# Patient Record
Sex: Male | Born: 1955
Health system: Southern US, Community
[De-identification: ages and names within clinical notes are randomized; demographics above are authoritative.]

## PROBLEM LIST (undated history)

## (undated) DIAGNOSIS — T8859XA Other complications of anesthesia, initial encounter: Secondary | ICD-10-CM

## (undated) DIAGNOSIS — C801 Malignant (primary) neoplasm, unspecified: Secondary | ICD-10-CM

## (undated) DIAGNOSIS — T4145XA Adverse effect of unspecified anesthetic, initial encounter: Secondary | ICD-10-CM

## (undated) DIAGNOSIS — F1021 Alcohol dependence, in remission: Secondary | ICD-10-CM

## (undated) DIAGNOSIS — G4730 Sleep apnea, unspecified: Secondary | ICD-10-CM

## (undated) DIAGNOSIS — K219 Gastro-esophageal reflux disease without esophagitis: Secondary | ICD-10-CM

## (undated) DIAGNOSIS — D490 Neoplasm of unspecified behavior of digestive system: Secondary | ICD-10-CM

## (undated) DIAGNOSIS — R21 Rash and other nonspecific skin eruption: Secondary | ICD-10-CM

## (undated) DIAGNOSIS — J189 Pneumonia, unspecified organism: Secondary | ICD-10-CM

## (undated) DIAGNOSIS — R011 Cardiac murmur, unspecified: Secondary | ICD-10-CM

## (undated) HISTORY — PX: OTHER SURGICAL HISTORY: SHX169

## (undated) HISTORY — PX: TUMOR REMOVAL: SHX12

## (undated) HISTORY — DX: Alcohol dependence, in remission: F10.21

## (undated) HISTORY — DX: Neoplasm of unspecified behavior of digestive system: D49.0

---
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
## 2004-08-01 ENCOUNTER — Ambulatory Visit: Payer: Self-pay | Admitting: Family Medicine

## 2004-08-09 ENCOUNTER — Ambulatory Visit: Payer: Self-pay | Admitting: Cardiology

## 2004-08-28 ENCOUNTER — Ambulatory Visit: Payer: Self-pay | Admitting: Family Medicine

## 2004-10-10 ENCOUNTER — Ambulatory Visit: Payer: Self-pay | Admitting: Family Medicine

## 2005-03-04 ENCOUNTER — Ambulatory Visit: Payer: Self-pay | Admitting: Family Medicine

## 2006-10-16 ENCOUNTER — Ambulatory Visit: Payer: Self-pay | Admitting: Family Medicine

## 2007-01-25 ENCOUNTER — Encounter (INDEPENDENT_AMBULATORY_CARE_PROVIDER_SITE_OTHER): Payer: Self-pay | Admitting: Urology

## 2007-01-25 ENCOUNTER — Other Ambulatory Visit: Admission: RE | Admit: 2007-01-25 | Discharge: 2007-01-25 | Payer: Self-pay | Admitting: Urology

## 2013-05-13 ENCOUNTER — Emergency Department (HOSPITAL_COMMUNITY): Payer: BC Managed Care – PPO

## 2013-05-13 ENCOUNTER — Observation Stay (HOSPITAL_COMMUNITY)
Admission: EM | Admit: 2013-05-13 | Discharge: 2013-05-14 | Disposition: A | Discharge status: Home / Self Care | Payer: BC Managed Care – PPO | Attending: Internal Medicine | Admitting: Internal Medicine

## 2013-05-13 ENCOUNTER — Encounter (HOSPITAL_COMMUNITY): Payer: Self-pay | Admitting: Emergency Medicine

## 2013-05-13 DIAGNOSIS — R42 Dizziness and giddiness: Secondary | ICD-10-CM | POA: Diagnosis present

## 2013-05-13 DIAGNOSIS — H8110 Benign paroxysmal vertigo, unspecified ear: Principal | ICD-10-CM | POA: Insufficient documentation

## 2013-05-13 DIAGNOSIS — K3189 Other diseases of stomach and duodenum: Secondary | ICD-10-CM

## 2013-05-13 DIAGNOSIS — R079 Chest pain, unspecified: Secondary | ICD-10-CM | POA: Insufficient documentation

## 2013-05-13 DIAGNOSIS — I10 Essential (primary) hypertension: Secondary | ICD-10-CM | POA: Insufficient documentation

## 2013-05-13 DIAGNOSIS — K319 Disease of stomach and duodenum, unspecified: Secondary | ICD-10-CM | POA: Insufficient documentation

## 2013-05-13 HISTORY — DX: Gastro-esophageal reflux disease without esophagitis: K21.9

## 2013-05-13 HISTORY — DX: Cardiac murmur, unspecified: R01.1

## 2013-05-13 LAB — PROTIME-INR
INR: 1.06 (ref 0.00–1.49)
Prothrombin Time: 13.6 seconds (ref 11.6–15.2)

## 2013-05-13 LAB — BASIC METABOLIC PANEL
CO2: 26 mEq/L (ref 19–32)
Calcium: 9.8 mg/dL (ref 8.4–10.5)
Chloride: 101 mEq/L (ref 96–112)
GFR calc Af Amer: >90 mL/min (ref >90)
Glucose, Bld: 110 mg/dL — ABNORMAL HIGH (ref 70–99)
Potassium: 4.3 mEq/L (ref 3.5–5.1)
Sodium: 136 mEq/L (ref 135–145)

## 2013-05-13 LAB — CBC WITH DIFFERENTIAL/PLATELET
Basophils Absolute: 0 10*3/uL (ref 0.0–0.1)
Eosinophils Absolute: 0.1 10*3/uL (ref 0.0–0.7)
Eosinophils Relative: 1 % (ref 0–5)
Lymphocytes Relative: 22 % (ref 12–46)
Lymphs Abs: 2.4 10*3/uL (ref 0.7–4.0)
MCH: 30.3 pg (ref 26.0–34.0)
MCV: 91.3 fL (ref 78.0–100.0)
Platelets: 316 10*3/uL (ref 150–400)
RBC: 4.82 MIL/uL (ref 4.22–5.81)
WBC: 10.8 10*3/uL — ABNORMAL HIGH (ref 4.0–10.5)

## 2013-05-13 LAB — TROPONIN I: Troponin I: <0.3 ng/mL (ref <0.30)

## 2013-05-13 LAB — APTT: aPTT: 29 seconds (ref 24–37)

## 2013-05-13 MED ORDER — IOHEXOL 350 MG/ML SOLN
100.0000 mL | Freq: Once | INTRAVENOUS | Status: AC | PRN
  Administered 2013-05-13: 100 mL via INTRAVENOUS

## 2013-05-13 MED ORDER — MECLIZINE HCL 12.5 MG PO TABS
25.0000 mg | ORAL_TABLET | Freq: Once | ORAL | Status: AC
  Administered 2013-05-13: 25 mg via ORAL
  Filled 2013-05-13: qty 2

## 2013-05-13 MED ORDER — SODIUM CHLORIDE 0.9 % IV BOLUS (SEPSIS)
1000.0000 mL | Freq: Once | INTRAVENOUS | Status: AC
  Administered 2013-05-13: 1000 mL via INTRAVENOUS

## 2013-05-13 NOTE — ED Provider Notes (Signed)
CSN: [id number]     Arrival date & time 05/13/13  1815 History   First MD Initiated Contact with Patient 05/13/13 1821     This chart was scribed for Timothy Covington B. Bernette Mayers, MD by Manuela Schwartz, ED scribe. This patient was seen in room APA12/APA12 and the patient's care was started at 1815.  Chief Complaint  Patient presents with  . Dizziness   The history is provided by the spouse and the patient. No language interpreter was used.   HPI Comments: Timothy Hall is a 57 y.o. male who presents to the Emergency Department complaining of dizziness episode today while at work with associated midsternal CP which radiates to his neck and upper back. He states his dizziness occurs when he stands up but his CP is gone now. He states feels a little off-balance while walking and having generalized weakness in his legs; he denies any speech changes. He went to [facility] from work and was sent here for further evaluation. He was given 325 mg ASA at [facility] and now is CP free. He also c/o temporal HA which resolved.  He denies SOB. He states previous episode, 4 days ago recently but this was more severe. He states a baseline tinnitus.  Family hx of MI and x4 CABG  PCP Dr. [name]- Has not seen him for a while  Past Medical History  Diagnosis Date  . GERD (gastroesophageal reflux disease)   . Heart murmur    Past Surgical History  Procedure Laterality Date  . 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    No family history on file. History  Substance Use Topics  . Smoking status: Current Every Day Smoker  . Smokeless tobacco: Not on file  . Alcohol Use: No     Comment: quit in 1985    Review of Systems  Constitutional: Negative for fever and chills.  Respiratory: Negative for shortness of breath.   Cardiovascular: Positive for chest pain.  Gastrointestinal: Negative for nausea and vomiting.  Musculoskeletal: Positive for back pain (upper back pain).  Neurological: Positive for  dizziness and headaches. Negative for weakness.  All other systems reviewed and are negative.   A complete 10 system review of systems was obtained and all systems are negative except as noted in the HPI and PMH.   Allergies  Review of patient's allergies indicates no known allergies.  Home Medications  No current outpatient prescriptions on file. Triage Vitals: BP 169/69  Pulse 66  Temp(Src) 98.2 F (36.8 C) (Oral)  Resp 16  Ht 6' (1.829 m)  Wt 189 lb (85.73 kg)  BMI 25.63 kg/m2  SpO2 95% Physical Exam  Nursing note and vitals reviewed. Constitutional: He is oriented to person, place, and time. He appears well-developed and well-nourished.  HENT:  Head: Normocephalic and atraumatic.  Eyes: EOM are normal. Pupils are equal, round, and reactive to light.  Neck: Normal range of motion. Neck supple.  Cardiovascular: Normal rate, normal heart sounds and intact distal pulses.   Pulmonary/Chest: Effort normal and breath sounds normal.  Abdominal: Bowel sounds are normal. He exhibits no distension. There is no tenderness.  Musculoskeletal: Normal range of motion. He exhibits no edema and no tenderness.  Neurological: He is alert and oriented to person, place, and time. He has normal strength. No cranial nerve deficit or sensory deficit.  Skin: Skin is warm and dry. No rash noted.  Psychiatric: He has a normal mood and affect.  ED Course  Procedures (including critical care time) DIAGNOSTIC STUDIES: Oxygen Saturation is 95% on room air, adequate by my interpretation.    COORDINATION OF CARE: At 710 PM Discussed treatment plan with patient which includes CXR, head CT, blood work, cardiac enzymes, EKG. Patient agrees.   Labs Review Labs Reviewed  CBC WITH DIFFERENTIAL - Abnormal; Notable for the following:    WBC 10.8 (*)    All other components within normal limits  BASIC METABOLIC PANEL - Abnormal; Notable for the following:    Glucose, Bld 110 (*)    All other components  within normal limits  COMPREHENSIVE METABOLIC PANEL - Abnormal; Notable for the following:    Glucose, Bld 101 (*)    All other components within normal limits  TROPONIN I  PROTIME-INR  APTT  TROPONIN I  TROPONIN I  TROPONIN I  CBC   Imaging Review Dg Chest 2 View  05/13/2013   CLINICAL DATA:  Chest pain, dizziness.  EXAM: CHEST  2 VIEW  COMPARISON:  None.  FINDINGS: The heart size and mediastinal contours are within normal limits. Both lungs are clear. The visualized skeletal structures are unremarkable.  IMPRESSION: No active cardiopulmonary disease.   Electronically Signed   By: Roque Lias M.D.   On: 05/13/2013 19:21   Ct Head Wo Contrast  05/13/2013   CLINICAL DATA:  Dizziness and chest pain today  EXAM: CT HEAD WITHOUT CONTRAST  TECHNIQUE: Contiguous axial images were obtained from the base of the skull through the vertex without intravenous contrast.  COMPARISON:  None.  FINDINGS: No mass lesion. No midline shift. No acute hemorrhage or hematoma. No extra-axial fluid collections. No evidence of acute infarction. There is scattered mild inflammatory change involving the paranasal sinuses. Calvarium is intact.  IMPRESSION: Mild sinusitis. No acute intracranial abnormalities.   Electronically Signed   By: Esperanza Heir M.D.   On: 05/13/2013 19:22   Ct Angio Chest Aorta W/cm &/or Wo/cm  05/13/2013   CLINICAL DATA:  Aortic dissection. Mid sternal chest pain. Upper back pain.  EXAM: CT ANGIOGRAPHY CHEST, ABDOMEN AND PELVIS  TECHNIQUE: Multidetector CT imaging through the chest, abdomen and pelvis was performed using the standard protocol during bolus administration of intravenous contrast. Multiplanar reconstructed images including MIPs were obtained and reviewed to evaluate the vascular anatomy.  CONTRAST:  OMNIPAQUE IOHEXOL 350 MG/ML SOLN  COMPARISON:  None.  FINDINGS: CTA CHEST FINDINGS  Noncontrast imaging of the thoracic aorta shows no evidence of intramural hematoma. 3 vessel  aortic arch appears normal. Aortic branch vessels are normal. Pulmonary arteries appear within normal limits. No pulmonary emboli are identified. Heart grossly normal. No pericardial or pleural effusion. Thoracic esophagus appears normal. Trachea and central airways appear normal. No axillary, mediastinal, or hilar adenopathy. Bones appear within normal limits.  Review of the MIP images confirms the above findings.  CTA ABDOMEN AND PELVIS FINDINGS  The abdominal aorta is normal. The mesenteric vessels appear normal. Minimal iliofemoral atherosclerosis. The urinary bladder appears normal. There is no free fluid. Colonic diverticulosis without diverticulitis. Liver appears within normal limits allowing for contrast phase. Spleen is normal. Small granulomatous calcification in the spleen is incidentally noted.  There is a very large upper abdominal mass that abuts and is intimately associated with the inferior gastric fundus. On axial imaging, the mass measures 18.6 cm transverse by 12.2 cm AP. Craniocaudal measurement is 16.8 cm. This has peripheral enhancement but is predominantly cystic with low attenuation fluid centrally (11 Hounsfield units). The  findings are suspicious for neoplasm however a duplication cyst or on old pseudocyst could have a similar appearance. The adjacent pancreas appears normal. There is no mural calcification in the mass. There is no obstruction of the stomach. No aggressive osseous lesions are identified. Mild thoracic spondylosis.  Review of the MIP images confirms the above findings.  IMPRESSION: 1. Negative CTA of the chest. 2. No acute vascular abnormality in the abdomen or pelvis. 3. Cystic mass extending off the inferior margin of the stomach. The origin is unclear and this may represent a cystic mesenteric mass or an exophytic mass arising from the serosa of the gastric fundus. The peripheral enhancement raises the possibility of neoplasm. Other differential considerations include a  mesenteric duplication cyst or pancreatic pseudocyst however the peripheral enhancement argues against a benign etiology.   Electronically Signed   By: Andreas Newport M.D.   On: 05/13/2013 21:42   Ct Angio Abd/pel W/ And/or W/o  05/13/2013   CLINICAL DATA:  Aortic dissection. Mid sternal chest pain. Upper back pain.  EXAM: CT ANGIOGRAPHY CHEST, ABDOMEN AND PELVIS  TECHNIQUE: Multidetector CT imaging through the chest, abdomen and pelvis was performed using the standard protocol during bolus administration of intravenous contrast. Multiplanar reconstructed images including MIPs were obtained and reviewed to evaluate the vascular anatomy.  CONTRAST:  OMNIPAQUE IOHEXOL 350 MG/ML SOLN  COMPARISON:  None.  FINDINGS: CTA CHEST FINDINGS  Noncontrast imaging of the thoracic aorta shows no evidence of intramural hematoma. 3 vessel aortic arch appears normal. Aortic branch vessels are normal. Pulmonary arteries appear within normal limits. No pulmonary emboli are identified. Heart grossly normal. No pericardial or pleural effusion. Thoracic esophagus appears normal. Trachea and central airways appear normal. No axillary, mediastinal, or hilar adenopathy. Bones appear within normal limits.  Review of the MIP images confirms the above findings.  CTA ABDOMEN AND PELVIS FINDINGS  The abdominal aorta is normal. The mesenteric vessels appear normal. Minimal iliofemoral atherosclerosis. The urinary bladder appears normal. There is no free fluid. Colonic diverticulosis without diverticulitis. Liver appears within normal limits allowing for contrast phase. Spleen is normal. Small granulomatous calcification in the spleen is incidentally noted.  There is a very large upper abdominal mass that abuts and is intimately associated with the inferior gastric fundus. On axial imaging, the mass measures 18.6 cm transverse by 12.2 cm AP. Craniocaudal measurement is 16.8 cm. This has peripheral enhancement but is predominantly cystic  with low attenuation fluid centrally (11 Hounsfield units). The findings are suspicious for neoplasm however a duplication cyst or on old pseudocyst could have a similar appearance. The adjacent pancreas appears normal. There is no mural calcification in the mass. There is no obstruction of the stomach. No aggressive osseous lesions are identified. Mild thoracic spondylosis.  Review of the MIP images confirms the above findings.  IMPRESSION: 1. Negative CTA of the chest. 2. No acute vascular abnormality in the abdomen or pelvis. 3. Cystic mass extending off the inferior margin of the stomach. The origin is unclear and this may represent a cystic mesenteric mass or an exophytic mass arising from the serosa of the gastric fundus. The peripheral enhancement raises the possibility of neoplasm. Other differential considerations include a mesenteric duplication cyst or pancreatic pseudocyst however the peripheral enhancement argues against a benign etiology.   Electronically Signed   By: Andreas Newport M.D.   On: 05/13/2013 21:42    EKG Interpretation     Ventricular Rate:  68 PR Interval:  160 QRS Duration:  92 QT Interval:  374 QTC Calculation: 397 R Axis:   44 Text Interpretation:  Normal sinus rhythm Normal ECG No previous ECGs available            MDM   1. Chest pain   2. Vertigo   3. Gastric mass   4. Dizziness and giddiness   5. HTN (hypertension)     Labs and imaging reviewed with the patient including intraabdominal mass of unclear etiology. Will plan admission for rule out MI and to ensure adequate evaluation of his other findings.   I personally performed the services described in this documentation, which was scribed in my presence. The recorded information has been reviewed and is accurate.        Ryliee Figge B. Bernette Mayers, MD 05/14/13 [phone]

## 2013-05-13 NOTE — ED Notes (Addendum)
Pt arrived to er by [facility] from [facility] with c/o intermittent dizziness and neck soreness, pt states that he has been having dizzy spells for the past few weeks, today the dizziness did not go away like it normally did, pt also experienced pain to mid center of back area with n/v X1 episode. Pt given 325mg  aspirin at [facility], arrived to er pain free. Denies any sob or diaphoresis, pt also reports that he felt like his legs were "heavy" today and that if he gets up the dizziness returns.

## 2013-05-14 DIAGNOSIS — H8110 Benign paroxysmal vertigo, unspecified ear: Secondary | ICD-10-CM

## 2013-05-14 DIAGNOSIS — K319 Disease of stomach and duodenum, unspecified: Secondary | ICD-10-CM

## 2013-05-14 DIAGNOSIS — I10 Essential (primary) hypertension: Secondary | ICD-10-CM

## 2013-05-14 DIAGNOSIS — R079 Chest pain, unspecified: Secondary | ICD-10-CM | POA: Diagnosis present

## 2013-05-14 DIAGNOSIS — R42 Dizziness and giddiness: Secondary | ICD-10-CM

## 2013-05-14 LAB — CBC
HCT: 41.4 % (ref 39.0–52.0)
Hemoglobin: 13.7 g/dL (ref 13.0–17.0)
MCH: 30 pg (ref 26.0–34.0)
MCHC: 33.1 g/dL (ref 30.0–36.0)
MCV: 90.8 fL (ref 78.0–100.0)
Platelets: 277 10*3/uL (ref 150–400)
RBC: 4.56 MIL/uL (ref 4.22–5.81)

## 2013-05-14 LAB — COMPREHENSIVE METABOLIC PANEL
BUN: 15 mg/dL (ref 6–23)
CO2: 27 mEq/L (ref 19–32)
Calcium: 9.1 mg/dL (ref 8.4–10.5)
Chloride: 103 mEq/L (ref 96–112)
Creatinine, Ser: 0.94 mg/dL (ref 0.50–1.35)
GFR calc Af Amer: >90 mL/min (ref >90)
GFR calc non Af Amer: >90 mL/min (ref >90)
Glucose, Bld: 101 mg/dL — ABNORMAL HIGH (ref 70–99)
Total Bilirubin: 0.3 mg/dL (ref 0.3–1.2)
Total Protein: 6.4 g/dL (ref 6.0–8.3)

## 2013-05-14 LAB — TROPONIN I: Troponin I: <0.3 ng/mL (ref <0.30)

## 2013-05-14 MED ORDER — ENOXAPARIN SODIUM 40 MG/0.4ML ~~LOC~~ SOLN
40.0000 mg | SUBCUTANEOUS | Status: DC
  Administered 2013-05-14: 40 mg via SUBCUTANEOUS
  Filled 2013-05-14: qty 0.4

## 2013-05-14 MED ORDER — MECLIZINE HCL 25 MG PO TABS: 25.0000 mg | ORAL_TABLET | Freq: Three times a day (TID) | ORAL | Status: DC

## 2013-05-14 MED ORDER — ASPIRIN EC 81 MG PO TBEC: 81.0000 mg | DELAYED_RELEASE_TABLET | Freq: Every day | ORAL | Status: DC

## 2013-05-14 MED ORDER — MECLIZINE HCL 12.5 MG PO TABS
25.0000 mg | ORAL_TABLET | Freq: Three times a day (TID) | ORAL | Status: DC
  Administered 2013-05-14: 25 mg via ORAL
  Filled 2013-05-14: qty 2

## 2013-05-14 MED ORDER — ONDANSETRON HCL 4 MG/2ML IJ SOLN: 4.0000 mg | Freq: Four times a day (QID) | INTRAMUSCULAR | Status: DC | PRN

## 2013-05-14 MED ORDER — AMLODIPINE BESYLATE 5 MG PO TABS
10.0000 mg | ORAL_TABLET | Freq: Every day | ORAL | Status: DC
  Administered 2013-05-14: 10 mg via ORAL
  Filled 2013-05-14: qty 2

## 2013-05-14 MED ORDER — HYDROCODONE-ACETAMINOPHEN 5-325 MG PO TABS: 1.0000 | ORAL_TABLET | ORAL | Status: DC | PRN

## 2013-05-14 MED ORDER — ONDANSETRON HCL 4 MG PO TABS: 4.0000 mg | ORAL_TABLET | Freq: Four times a day (QID) | ORAL | Status: DC | PRN

## 2013-05-14 NOTE — H&P (Addendum)
PCP:   [name],[name], MD   Chief Complaint:  Dizziness  HPI: 57-year-old male came to the ED with dizziness which started almost 3 weeks ago and has been going off and on. Today patient says the dizziness lasted for long time and was associated with midsternal chest pain which started from middle of the back and radiated to the neck and chest. Patient says that the dizziness gets worse on standing and he has been off balance. Patient was seen at [facility] and was sent to the endocrine ED for further evaluation. In the ED patient underwent CT scan of the head as well as CT A of the chest and abdomen to rule out aortic dissection, the report is negative for any vascular abnormality except for a cystic gastric mass noted in the stomach. Patient says  pain is resolved at this time, he denies shortness of breath no nausea vomiting or diarrhea. Patient does have family history of heart disease. Patient is a smoker and has been smoking for last 40 years.  Allergies:  No Known Allergies    Past Medical History  Diagnosis Date  . GERD (gastroesophageal reflux disease)   . Heart murmur     Past Surgical History  Procedure Laterality Date  . 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     Prior to Admission medications   Medication Sig Start Date End Date Taking? Authorizing Provider  omeprazole (PRILOSEC) 20 MG capsule Take 20 mg by mouth daily as needed (for acid reflux).   Yes Historical Provider, MD    Social History:  reports that he has been smoking.  He does not have any smokeless tobacco history on file. He reports that he does not drink alcohol or use illicit drugs.  Family history is significant for coronary artery disease  All the positives are listed in BOLD  Review of Systems:  HEENT: Headache, blurred vision, runny nose, sore throat, dizziness Neck: Hypothyroidism, hyperthyroidism,,lymphadenopathy Chest : Shortness of breath, history of  COPD, Asthma Heart : Chest pain, history of coronary arterey disease GI:  Nausea, vomiting, diarrhea, constipation, GERD GU: Dysuria, urgency, frequency of urination, hematuria Neuro: Stroke, seizures, syncope Psych: Depression, anxiety, hallucinations   Physical Exam: Blood pressure 162/78, pulse 64, temperature 97.9 F (36.6 C), temperature source Oral, resp. rate 18, height 6' (1.829 m), weight 77.293 kg (170 lb 6.4 oz), SpO2 98.00%. Constitutional:   Patient is a well-developed and well-nourished male* in no acute distress and cooperative with exam. Head: Normocephalic and atraumatic Mouth: Mucus membranes moist Eyes: PERRL, EOMI, conjunctivae normal Neck: Supple, No Thyromegaly Cardiovascular: RRR, S1 normal, S2 normal Pulmonary/Chest: CTAB, no wheezes, rales, or rhonchi Abdominal: Soft. Non-tender, non-distended, bowel sounds are normal, no masses, organomegaly, or guarding present.  Neurological: A&O x3, Strenght is normal and symmetric bilaterally, cranial nerve II-XII are grossly intact, no focal motor deficit, sensory intact to light touch bilaterally.  Extremities : No Cyanosis, Clubbing or Edema Brandt Darroff manuver done which is negative for dizziness  Labs on Admission:  Results for orders placed during the hospital encounter of 05/13/13 (from the past 48 hour(s))  CBC WITH DIFFERENTIAL     Status: Abnormal   Collection Time    05/13/13  7:23 PM      Result Value Range   WBC 10.8 (*) 4.0 - 10.5 K/uL   RBC 4.82  4.22 - 5.81 MIL/uL   Hemoglobin 14.6  13.0 - 17.0 g/dL     HCT 44.0  39.0 - 52.0 %   MCV 91.3  78.0 - 100.0 fL   MCH 30.3  26.0 - 34.0 pg   MCHC 33.2  30.0 - 36.0 g/dL   RDW 12.8  11.5 - 15.5 %   Platelets 316  150 - 400 K/uL   Neutrophils Relative % 71  43 - 77 %   Neutro Abs 7.7  1.7 - 7.7 K/uL   Lymphocytes Relative 22  12 - 46 %   Lymphs Abs 2.4  0.7 - 4.0 K/uL   Monocytes Relative 5  3 - 12 %   Monocytes Absolute 0.6  0.1 - 1.0 K/uL   Eosinophils  Relative 1  0 - 5 %   Eosinophils Absolute 0.1  0.0 - 0.7 K/uL   Basophils Relative 0  0 - 1 %   Basophils Absolute 0.0  0.0 - 0.1 K/uL  BASIC METABOLIC PANEL     Status: Abnormal   Collection Time    05/13/13  7:23 PM      Result Value Range   Sodium 136  135 - 145 mEq/L   Potassium 4.3  3.5 - 5.1 mEq/L   Chloride 101  96 - 112 mEq/L   CO2 26  19 - 32 mEq/L   Glucose, Bld 110 (*) 70 - 99 mg/dL   BUN 18  6 - 23 mg/dL   Creatinine, Ser 0.92  0.50 - 1.35 mg/dL   Calcium 9.8  8.4 - 10.5 mg/dL   GFR calc non Af Amer >90  >90 mL/min   GFR calc Af Amer >90  >90 mL/min   Comment: (NOTE)     The eGFR has been calculated using the CKD EPI equation.     This calculation has not been validated in all clinical situations.     eGFR's persistently <90 mL/min signify possible Chronic Kidney     Disease.  TROPONIN I     Status: None   Collection Time    05/13/13  7:23 PM      Result Value Range   Troponin I <0.30  <0.30 ng/mL   Comment:            Due to the release kinetics of cTnI,     a negative result within the first hours     of the onset of symptoms does not rule out     myocardial infarction with certainty.     If myocardial infarction is still suspected,     repeat the test at appropriate intervals.  PROTIME-INR     Status: None   Collection Time    05/13/13  7:23 PM      Result Value Range   Prothrombin Time 13.6  11.6 - 15.2 seconds   INR 1.06  0.00 - 1.49  APTT     Status: None   Collection Time    05/13/13  7:23 PM      Result Value Range   aPTT 29  24 - 37 seconds    Radiological Exams on Admission: Dg Chest 2 View  05/13/2013   CLINICAL DATA:  Chest pain, dizziness.  EXAM: CHEST  2 VIEW  COMPARISON:  None.  FINDINGS: The heart size and mediastinal contours are within normal limits. Both lungs are clear. The visualized skeletal structures are unremarkable.  IMPRESSION: No active cardiopulmonary disease.   Electronically Signed   By: James  Green M.D.   On: 05/13/2013  19:21   Ct Head Wo Contrast    05/13/2013   CLINICAL DATA:  Dizziness and chest pain today  EXAM: CT HEAD WITHOUT CONTRAST  TECHNIQUE: Contiguous axial images were obtained from the base of the skull through the vertex without intravenous contrast.  COMPARISON:  None.  FINDINGS: No mass lesion. No midline shift. No acute hemorrhage or hematoma. No extra-axial fluid collections. No evidence of acute infarction. There is scattered mild inflammatory change involving the paranasal sinuses. Calvarium is intact.  IMPRESSION: Mild sinusitis. No acute intracranial abnormalities.   Electronically Signed   By: Raymond  Rubner M.D.   On: 05/13/2013 19:22   Ct Angio Chest Aorta W/cm &/or Wo/cm  05/13/2013   CLINICAL DATA:  Aortic dissection. Mid sternal chest pain. Upper back pain.  EXAM: CT ANGIOGRAPHY CHEST, ABDOMEN AND PELVIS  TECHNIQUE: Multidetector CT imaging through the chest, abdomen and pelvis was performed using the standard protocol during bolus administration of intravenous contrast. Multiplanar reconstructed images including MIPs were obtained and reviewed to evaluate the vascular anatomy.  CONTRAST:  100mL OMNIPAQUE IOHEXOL 350 MG/ML SOLN  COMPARISON:  None.  FINDINGS: CTA CHEST FINDINGS  Noncontrast imaging of the thoracic aorta shows no evidence of intramural hematoma. 3 vessel aortic arch appears normal. Aortic branch vessels are normal. Pulmonary arteries appear within normal limits. No pulmonary emboli are identified. Heart grossly normal. No pericardial or pleural effusion. Thoracic esophagus appears normal. Trachea and central airways appear normal. No axillary, mediastinal, or hilar adenopathy. Bones appear within normal limits.  Review of the MIP images confirms the above findings.  CTA ABDOMEN AND PELVIS FINDINGS  The abdominal aorta is normal. The mesenteric vessels appear normal. Minimal iliofemoral atherosclerosis. The urinary bladder appears normal. There is no free fluid. Colonic  diverticulosis without diverticulitis. Liver appears within normal limits allowing for contrast phase. Spleen is normal. Small granulomatous calcification in the spleen is incidentally noted.  There is a very large upper abdominal mass that abuts and is intimately associated with the inferior gastric fundus. On axial imaging, the mass measures 18.6 cm transverse by 12.2 cm AP. Craniocaudal measurement is 16.8 cm. This has peripheral enhancement but is predominantly cystic with low attenuation fluid centrally (11 Hounsfield units). The findings are suspicious for neoplasm however a duplication cyst or on old pseudocyst could have a similar appearance. The adjacent pancreas appears normal. There is no mural calcification in the mass. There is no obstruction of the stomach. No aggressive osseous lesions are identified. Mild thoracic spondylosis.  Review of the MIP images confirms the above findings.  IMPRESSION: 1. Negative CTA of the chest. 2. No acute vascular abnormality in the abdomen or pelvis. 3. Cystic mass extending off the inferior margin of the stomach. The origin is unclear and this may represent a cystic mesenteric mass or an exophytic mass arising from the serosa of the gastric fundus. The peripheral enhancement raises the possibility of neoplasm. Other differential considerations include a mesenteric duplication cyst or pancreatic pseudocyst however the peripheral enhancement argues against a benign etiology.   Electronically Signed   By: Geoffrey  Lamke M.D.   On: 05/13/2013 21:42   Ct Angio Abd/pel W/ And/or W/o  05/13/2013   CLINICAL DATA:  Aortic dissection. Mid sternal chest pain. Upper back pain.  EXAM: CT ANGIOGRAPHY CHEST, ABDOMEN AND PELVIS  TECHNIQUE: Multidetector CT imaging through the chest, abdomen and pelvis was performed using the standard protocol during bolus administration of intravenous contrast. Multiplanar reconstructed images including MIPs were obtained and reviewed to evaluate  the vascular anatomy.  CONTRAST:  100mL   OMNIPAQUE IOHEXOL 350 MG/ML SOLN  COMPARISON:  None.  FINDINGS: CTA CHEST FINDINGS  Noncontrast imaging of the thoracic aorta shows no evidence of intramural hematoma. 3 vessel aortic arch appears normal. Aortic branch vessels are normal. Pulmonary arteries appear within normal limits. No pulmonary emboli are identified. Heart grossly normal. No pericardial or pleural effusion. Thoracic esophagus appears normal. Trachea and central airways appear normal. No axillary, mediastinal, or hilar adenopathy. Bones appear within normal limits.  Review of the MIP images confirms the above findings.  CTA ABDOMEN AND PELVIS FINDINGS  The abdominal aorta is normal. The mesenteric vessels appear normal. Minimal iliofemoral atherosclerosis. The urinary bladder appears normal. There is no free fluid. Colonic diverticulosis without diverticulitis. Liver appears within normal limits allowing for contrast phase. Spleen is normal. Small granulomatous calcification in the spleen is incidentally noted.  There is a very large upper abdominal mass that abuts and is intimately associated with the inferior gastric fundus. On axial imaging, the mass measures 18.6 cm transverse by 12.2 cm AP. Craniocaudal measurement is 16.8 cm. This has peripheral enhancement but is predominantly cystic with low attenuation fluid centrally (11 Hounsfield units). The findings are suspicious for neoplasm however a duplication cyst or on old pseudocyst could have a similar appearance. The adjacent pancreas appears normal. There is no mural calcification in the mass. There is no obstruction of the stomach. No aggressive osseous lesions are identified. Mild thoracic spondylosis.  Review of the MIP images confirms the above findings.  IMPRESSION: 1. Negative CTA of the chest. 2. No acute vascular abnormality in the abdomen or pelvis. 3. Cystic mass extending off the inferior margin of the stomach. The origin is unclear and  this may represent a cystic mesenteric mass or an exophytic mass arising from the serosa of the gastric fundus. The peripheral enhancement raises the possibility of neoplasm. Other differential considerations include a mesenteric duplication cyst or pancreatic pseudocyst however the peripheral enhancement argues against a benign etiology.   Electronically Signed   By: Geoffrey  Lamke M.D.   On: 05/13/2013 21:42    Assessment/Plan Active Problems:   Dizziness and giddiness   Chest pain   HTN (hypertension)   Gastric mass  Dizziness Most likely benign positional vertigo, as patient says that dizziness gets worse in certain positions. Will need to get an MRI to rule out underlying neurologic disorder. We'll start the patient on meclizine 25 mg by mouth 3 times a day.  Chest pain We'll admit him to telemetry and obtain 3 sets of cardiac enzymes. EKG shows normal sinus rhythm  Hypertension Patient's blood pressure is elevated, we'll start him on amlodipine 10 mg by mouth daily  Gastric mass CTA of the abdomen showed cystic mesenteric mass or an exophytic mass arising from the serosa of the gastric fundus. Patient will need further evaluation as outpatient.    Code status: Full code  Family discussion: Discussed with patient in detail   Time Spent on Admission: 75 min   Imelda Dandridge S [facility] Pager: [phone] 05/14/2013, 12:22 AM  If 7PM-7AM, please contact night-coverage  [email or web address]  Password [email or web address]   

## 2013-05-14 NOTE — Progress Notes (Signed)
Patient states understanding of discharge instructions.  

## 2013-05-14 NOTE — Discharge Summary (Signed)
Physician Discharge Summary  Timothy Hall ZOX:[id number] DOB: [birth year] DOA: 05/13/2013  PCP: Josue Hector, MD  Admit date: 05/13/2013 Discharge date: 05/14/2013  Time spent: 45 minutes  Recommendations for Outpatient Follow-up:  1. Patient will be scheduled with Dr. Luvenia Starch office later this week to further evaluate gastric mass 2. He will be followed up by [facility] cardiology for chest pain 3. Follow up with primary care doctor in 1-2 weeks  Discharge Diagnoses:  Principal Problem:   Benign paroxysmal positional vertigo Active Problems:   Dizziness and giddiness   Chest pain   HTN (hypertension)   Gastric mass   Discharge Condition: improved  Diet recommendation: low salt  Filed Weights   05/13/13 1821 05/13/13 2320  Weight: 85.73 kg (189 lb) 77.293 kg (170 lb 6.4 oz)    History of present illness:  57 year old male came to the ED with dizziness which started almost 3 weeks ago and has been going off and on. Today patient says the dizziness lasted for long time and was associated with midsternal chest pain which started from middle of the back and radiated to the neck and chest. Patient says that the dizziness gets worse on standing and he has been off balance. Patient was seen at [facility] urgent care and was sent to the endocrine ED for further evaluation. In the ED patient underwent CT scan of the head as well as CT A of the chest and abdomen to rule out aortic dissection, the report is negative for any vascular abnormality except for a cystic gastric mass noted in the stomach. Patient says pain is resolved at this time, he denies shortness of breath no nausea vomiting or diarrhea. Patient does have family history of heart disease. Patient is a smoker and has been smoking for last 40 years.   Hospital Course:  This patient was admitted to the hospital for dizziness.  He described the dizziness as positional, associated with nausea, intermittent.  CT of the head was  done which was unremarkable.  He was admitted for further work up.  He was started on meclizine which has significantly improved his symptoms.  He no longer has any dizziness.  He also had some atypical chest pain. EKG was found to be normal and cardiac enzymes were negative.  He has been referred to cardiology for further work up with stress testing if needed.  He has been recommended to take a daily aspirin.  Work up in the ED including CTA of chest/abd/pelvis to rule out aortic dissection.  This was found to be negative, but incidentally, he was found to have a cystic mass extending off the inferior margin of the stomach. Patient has otherwise been asymptomatic, he does not have any abdominal pain, no unintentional weight loss.  This was discussed with Dr. Jena Gauss who has agreed to follow the patient up as an outpatient to pursue further work up of this mass, although he feels that patient will likely need to go to [place] for endoscopic ultrasound. Currently, the patient's dizziness has resolved and he is feeling improved. He does not have any neurologic deficits at this time.  I feel that MRI of the brain would be low yield at this point and does not need to be pursued.  He is requesting discharge home and I think this is reasonable.   Procedures:  none  Consultations:  none  Discharge Exam: Filed Vitals:   05/14/13 0455  BP: 142/80  Pulse: 60  Temp: 97.5 F (36.4 C)  Resp:  18    General: NAD Cardiovascular: S1, S2 RRR Respiratory: CTA B  Discharge Instructions  Discharge Orders   Future Orders Complete By Expires   Call MD for:  persistant dizziness or light-headedness  As directed    Call MD for:  severe uncontrolled pain  As directed    Diet - low sodium heart healthy  As directed    Increase activity slowly  As directed        Medication List         aspirin EC 81 MG tablet  Take 1 tablet (81 mg total) by mouth daily.     meclizine 25 MG tablet  Commonly known as:   ANTIVERT  Take 1 tablet (25 mg total) by mouth 3 (three) times daily.     omeprazole 20 MG capsule  Commonly known as:  PRILOSEC  Take 20 mg by mouth daily as needed (for acid reflux).       No Known Allergies     Follow-up Information   Follow up with Josue Hector, MD. (1-2 weeks)    Specialty:  Family Medicine   Contact information:   [address] [place] [address] [phone]       Follow up with Eula Listen, MD. (his office will call you on monday for an appointment later this week)    Specialty:  Gastroenterology   Contact information:   [address] [place] [address] [phone]       Follow up with [place] cardiology. (they will call you with an appointment)        The results of significant diagnostics from this hospitalization (including imaging, microbiology, ancillary and laboratory) are listed below for reference.    Significant Diagnostic Studies: Dg Chest 2 View  05/13/2013   CLINICAL DATA:  Chest pain, dizziness.  EXAM: CHEST  2 VIEW  COMPARISON:  None.  FINDINGS: The heart size and mediastinal contours are within normal limits. Both lungs are clear. The visualized skeletal structures are unremarkable.  IMPRESSION: No active cardiopulmonary disease.   Electronically Signed   By: Roque Lias M.D.   On: 05/13/2013 19:21   Ct Head Wo Contrast  05/13/2013   CLINICAL DATA:  Dizziness and chest pain today  EXAM: CT HEAD WITHOUT CONTRAST  TECHNIQUE: Contiguous axial images were obtained from the base of the skull through the vertex without intravenous contrast.  COMPARISON:  None.  FINDINGS: No mass lesion. No midline shift. No acute hemorrhage or hematoma. No extra-axial fluid collections. No evidence of acute infarction. There is scattered mild inflammatory change involving the paranasal sinuses. Calvarium is intact.  IMPRESSION: Mild sinusitis. No acute intracranial abnormalities.   Electronically Signed   By:  Esperanza Heir M.D.   On: 05/13/2013 19:22   Ct Angio Chest Aorta W/cm &/or Wo/cm  05/13/2013   CLINICAL DATA:  Aortic dissection. Mid sternal chest pain. Upper back pain.  EXAM: CT ANGIOGRAPHY CHEST, ABDOMEN AND PELVIS  TECHNIQUE: Multidetector CT imaging through the chest, abdomen and pelvis was performed using the standard protocol during bolus administration of intravenous contrast. Multiplanar reconstructed images including MIPs were obtained and reviewed to evaluate the vascular anatomy.  CONTRAST:  OMNIPAQUE IOHEXOL 350 MG/ML SOLN  COMPARISON:  None.  FINDINGS: CTA CHEST FINDINGS  Noncontrast imaging of the thoracic aorta shows no evidence of intramural hematoma. 3 vessel aortic arch appears normal. Aortic branch vessels are normal. Pulmonary arteries appear within normal limits. No pulmonary emboli  are identified. Heart grossly normal. No pericardial or pleural effusion. Thoracic esophagus appears normal. Trachea and central airways appear normal. No axillary, mediastinal, or hilar adenopathy. Bones appear within normal limits.  Review of the MIP images confirms the above findings.  CTA ABDOMEN AND PELVIS FINDINGS  The abdominal aorta is normal. The mesenteric vessels appear normal. Minimal iliofemoral atherosclerosis. The urinary bladder appears normal. There is no free fluid. Colonic diverticulosis without diverticulitis. Liver appears within normal limits allowing for contrast phase. Spleen is normal. Small granulomatous calcification in the spleen is incidentally noted.  There is a very large upper abdominal mass that abuts and is intimately associated with the inferior gastric fundus. On axial imaging, the mass measures 18.6 cm transverse by 12.2 cm AP. Craniocaudal measurement is 16.8 cm. This has peripheral enhancement but is predominantly cystic with low attenuation fluid centrally (11 Hounsfield units). The findings are suspicious for neoplasm however a duplication cyst or on old  pseudocyst could have a similar appearance. The adjacent pancreas appears normal. There is no mural calcification in the mass. There is no obstruction of the stomach. No aggressive osseous lesions are identified. Mild thoracic spondylosis.  Review of the MIP images confirms the above findings.  IMPRESSION: 1. Negative CTA of the chest. 2. No acute vascular abnormality in the abdomen or pelvis. 3. Cystic mass extending off the inferior margin of the stomach. The origin is unclear and this may represent a cystic mesenteric mass or an exophytic mass arising from the serosa of the gastric fundus. The peripheral enhancement raises the possibility of neoplasm. Other differential considerations include a mesenteric duplication cyst or pancreatic pseudocyst however the peripheral enhancement argues against a benign etiology.   Electronically Signed   By: Andreas Newport M.D.   On: 05/13/2013 21:42   Ct Angio Abd/pel W/ And/or W/o  05/13/2013   CLINICAL DATA:  Aortic dissection. Mid sternal chest pain. Upper back pain.  EXAM: CT ANGIOGRAPHY CHEST, ABDOMEN AND PELVIS  TECHNIQUE: Multidetector CT imaging through the chest, abdomen and pelvis was performed using the standard protocol during bolus administration of intravenous contrast. Multiplanar reconstructed images including MIPs were obtained and reviewed to evaluate the vascular anatomy.  CONTRAST:  OMNIPAQUE IOHEXOL 350 MG/ML SOLN  COMPARISON:  None.  FINDINGS: CTA CHEST FINDINGS  Noncontrast imaging of the thoracic aorta shows no evidence of intramural hematoma. 3 vessel aortic arch appears normal. Aortic branch vessels are normal. Pulmonary arteries appear within normal limits. No pulmonary emboli are identified. Heart grossly normal. No pericardial or pleural effusion. Thoracic esophagus appears normal. Trachea and central airways appear normal. No axillary, mediastinal, or hilar adenopathy. Bones appear within normal limits.  Review of the MIP images  confirms the above findings.  CTA ABDOMEN AND PELVIS FINDINGS  The abdominal aorta is normal. The mesenteric vessels appear normal. Minimal iliofemoral atherosclerosis. The urinary bladder appears normal. There is no free fluid. Colonic diverticulosis without diverticulitis. Liver appears within normal limits allowing for contrast phase. Spleen is normal. Small granulomatous calcification in the spleen is incidentally noted.  There is a very large upper abdominal mass that abuts and is intimately associated with the inferior gastric fundus. On axial imaging, the mass measures 18.6 cm transverse by 12.2 cm AP. Craniocaudal measurement is 16.8 cm. This has peripheral enhancement but is predominantly cystic with low attenuation fluid centrally (11 Hounsfield units). The findings are suspicious for neoplasm however a duplication cyst or on old pseudocyst could have a similar appearance. The adjacent pancreas appears  normal. There is no mural calcification in the mass. There is no obstruction of the stomach. No aggressive osseous lesions are identified. Mild thoracic spondylosis.  Review of the MIP images confirms the above findings.  IMPRESSION: 1. Negative CTA of the chest. 2. No acute vascular abnormality in the abdomen or pelvis. 3. Cystic mass extending off the inferior margin of the stomach. The origin is unclear and this may represent a cystic mesenteric mass or an exophytic mass arising from the serosa of the gastric fundus. The peripheral enhancement raises the possibility of neoplasm. Other differential considerations include a mesenteric duplication cyst or pancreatic pseudocyst however the peripheral enhancement argues against a benign etiology.   Electronically Signed   By: Andreas Newport M.D.   On: 05/13/2013 21:42    Microbiology: No results found for this or any previous visit (from the past 240 hour(s)).   Labs: Basic Metabolic Panel:  Recent Labs Lab 05/13/13 1923 05/14/13 0633  NA 136 138   K 4.3 4.0  CL 101 103  CO2 26 27  GLUCOSE 110* 101*  BUN 18 15  CREATININE 0.92 0.94  CALCIUM 9.8 9.1   Liver Function Tests:  Recent Labs Lab 05/14/13 0633  AST 21  ALT 25  ALKPHOS 65  BILITOT 0.3  PROT 6.4  ALBUMIN 3.5   No results found for this basename: LIPASE, AMYLASE,  in the last 168 hours No results found for this basename: AMMONIA,  in the last 168 hours CBC:  Recent Labs Lab 05/13/13 1923 05/14/13 0633  WBC 10.8* 8.5  NEUTROABS 7.7  --   HGB 14.6 13.7  HCT 44.0 41.4  MCV 91.3 90.8  PLT 316 277   Cardiac Enzymes:  Recent Labs Lab 05/13/13 1923 05/14/13 0157 05/14/13 0633 05/14/13 1153  TROPONINI <0.30 <0.30 <0.30 <0.30   BNP: BNP (last 3 results) No results found for this basename: PROBNP,  in the last 8760 hours CBG: No results found for this basename: GLUCAP,  in the last 168 hours     Signed:  Kayela Humphres  [facility] 05/14/2013, 2:24 PM

## 2013-05-16 ENCOUNTER — Other Ambulatory Visit: Payer: Self-pay | Admitting: Gastroenterology

## 2013-05-16 ENCOUNTER — Encounter: Payer: Self-pay | Admitting: Gastroenterology

## 2013-05-16 ENCOUNTER — Ambulatory Visit (INDEPENDENT_AMBULATORY_CARE_PROVIDER_SITE_OTHER): Payer: BC Managed Care – PPO | Admitting: Gastroenterology

## 2013-05-16 VITALS — BP 151/79 | HR 81 | Temp 97.9°F | Wt 190.4 lb

## 2013-05-16 DIAGNOSIS — K319 Disease of stomach and duodenum, unspecified: Secondary | ICD-10-CM

## 2013-05-16 DIAGNOSIS — K3189 Other diseases of stomach and duodenum: Secondary | ICD-10-CM

## 2013-05-16 DIAGNOSIS — Z1211 Encounter for screening for malignant neoplasm of colon: Secondary | ICD-10-CM

## 2013-05-16 MED ORDER — OMEPRAZOLE 20 MG PO CPDR: 20.0000 mg | DELAYED_RELEASE_CAPSULE | Freq: Every day | ORAL | Status: DC

## 2013-05-16 NOTE — Patient Instructions (Signed)
Start taking Prilosec 1 capsule each morning, 30 minutes before breakfast. This was sent to your pharmacy.  We are setting you up for an endoscopic ultrasound with Dr. Christella Hartigan in the near future to further assess what is going on.

## 2013-05-16 NOTE — Progress Notes (Signed)
Primary Care Physician:  Timothy Hector, MD Primary Gastroenterologist:  Dr. Jena Hall   Chief Complaint  Patient presents with  . Mass    HPI:   [name] presents today as an urgent evaluation secondary to a gastric mass noted on CT angiogram during recent hospitalization for dizziness. CT impression noted cystic mass extending off the inferior margin of the stomach with unclear origin, differentials to include cystic mesenteric mass or exophytic mass arising from the serosa of the gastric fundus, mesenteric duplication cyst or pancreatic pseudocyst. Concern for neoplasm. Measurements 18.6 cm transverse by 12.2 cm AP, craniocaudal measurement 16.8 cm.   Notes history of severe GERD. Placed on Protonix 5-6 years ago. Nocturnal reflux. No prescription PPI for about 3 years. Takes OTC Prilosec prn. Denies weight loss, lack of appetite. Wife states he eats constantly, grazes. Had dizzy spell on Friday, with associated N/V, otherwise no N/V. When swallowing, sometimes feels like he has to cough. Feels like esophagus stretches then snaps back together. Gets hiccups when starting to eat, symptoms for several months. No melena. No hematochezia. No prior colonoscopy. No changes in bowel habits. Been at least 3 years since he has seen Dr. Lysbeth Hall for a physical.   Past Medical History  Diagnosis Date  . GERD (gastroesophageal reflux disease)   . Heart murmur   . Vertigo     Past Surgical History  Procedure Laterality Date  . Tumor removal      benign tumor removed from left breast and back area.     Current Outpatient Prescriptions  Medication Sig Dispense Refill  . meclizine (ANTIVERT) 25 MG tablet Take 1 tablet (25 mg total) by mouth 3 (three) times daily.  30 tablet  0  . omeprazole (PRILOSEC) 20 MG capsule Take 20 mg by mouth daily as needed (for acid reflux).      [name] aspirin EC 81 MG tablet Take 1 tablet (81 mg total) by mouth daily.  30 tablet  0   No current  facility-administered medications for this visit.    Allergies as of 05/16/2013  . (No Known Allergies)    Family History  Problem Relation Age of Onset  . Colon cancer Neg Hx   . Stomach cancer Neg Hx     History   Social History  . Marital Status: Married    Spouse Name: N/A    Number of Children: N/A  . Years of Education: N/A   Occupational History  . [employer] second language teacher    Social History Main Topics  . Smoking status: Current Every Day Smoker  . Smokeless tobacco: Not on file     Comment: 1 to 1.5 packs per day   . Alcohol Use: No     Comment: quit in 1985  . Drug Use: No  . Sexual Activity: Not on file   Other Topics Concern  . Not on file   Social History Narrative  . No narrative on file    Review of Systems: As mentioned in HPI  Physical Exam: BP 151/79  Pulse 81  Temp(Src) 97.9 F (36.6 C)  Wt 190 lb 6.4 oz (86.365 kg)  BMI 25.82 kg/m2 General:   Alert and oriented. Pleasant and cooperative. Well-nourished and well-developed.  Head:  Normocephalic and atraumatic. Eyes:  Without icterus, sclera clear and conjunctiva pink.  Ears:  Normal auditory acuity. Nose:  No deformity, discharge,  or lesions. Mouth:  No deformity or lesions, oral mucosa pink.  Neck:  Supple, without mass  or thyromegaly. Lungs:  Clear to auscultation bilaterally. No wheezes, rales, or rhonchi. No distress.  Heart:  S1, S2 present without murmurs appreciated.  Abdomen:  +BS, soft, rounded but non-distended. Non-tender. No appreciable HSM Rectal:  Deferred  Msk:  Symmetrical without gross deformities. Normal posture. Extremities:  Without clubbing or edema. Neurologic:  Alert and  oriented x4;  grossly normal neurologically. Skin:  Intact without significant lesions or rashes. Cervical Nodes:  No significant cervical adenopathy. Psych:  Alert and cooperative. Normal mood and affect.  Lab Results  Component Value Date   WBC 8.5 05/14/2013   HGB 13.7 05/14/2013    HCT 41.4 05/14/2013   MCV 90.8 05/14/2013   PLT 277 05/14/2013    Lab Results  Component Value Date   ALT 25 05/14/2013   AST 21 05/14/2013   ALKPHOS 65 05/14/2013   BILITOT 0.3 05/14/2013

## 2013-05-17 ENCOUNTER — Other Ambulatory Visit: Payer: Self-pay

## 2013-05-17 ENCOUNTER — Encounter: Payer: Self-pay | Admitting: Gastroenterology

## 2013-05-17 ENCOUNTER — Telehealth: Payer: Self-pay | Admitting: *Deleted

## 2013-05-17 ENCOUNTER — Encounter (HOSPITAL_COMMUNITY): Payer: Self-pay | Admitting: Pharmacy Technician

## 2013-05-17 ENCOUNTER — Telehealth: Payer: Self-pay

## 2013-05-17 DIAGNOSIS — Z1211 Encounter for screening for malignant neoplasm of colon: Secondary | ICD-10-CM | POA: Insufficient documentation

## 2013-05-17 DIAGNOSIS — K3189 Other diseases of stomach and duodenum: Secondary | ICD-10-CM

## 2013-05-17 NOTE — Progress Notes (Signed)
EUS scheduled, pt instructed and medications reviewed.  Patient instructions mailed to home.  Patient to call with any questions or concerns.  

## 2013-05-17 NOTE — Progress Notes (Signed)
cc'd to pcp 

## 2013-05-17 NOTE — Assessment & Plan Note (Signed)
58 year old male with incidental finding of a gastric mass noted on CT angiogram during hospitalization for dizziness. CT impression noted cystic mass extending off the inferior margin of the stomach with unclear origin, differentials to include cystic mesenteric mass or exophytic mass arising from the serosa of the gastric fundus, mesenteric duplication cyst or pancreatic pseudocyst. Concern for neoplasm. Measurements 18.6 cm transverse by 12.2 cm AP, craniocaudal measurement 16.8 cm.   Severe GERD noted, without daily PPI. Occasional hiccups noted recently; will place him on Prilosec daily.  Mass with unclear origin, and this will be best assessed via an EUS with FNA biopsy as appropriate. Will refer to Dr. Christella Hartigan.  As also recommended by Dr. Christella Hartigan, we will also refer him to General Surgery as this will need intervention in the near future.

## 2013-05-17 NOTE — Assessment & Plan Note (Signed)
No prior lower GI evaluation. No concerning symptoms. Needs colonoscopy in the future; however, this will be postponed until gastric mass is further evaluated. Patient and wife are both well aware of this plan and agreeable.

## 2013-05-17 NOTE — Telephone Encounter (Signed)
Pt's wife called stating she called Dr. Christella Hartigan office pt has a appt. Nov. 13 pt don't want to wait that long for a biopsy the lady they talked too was rude and hateful, we referred them to Dr. Christella Hartigan and pt would like to be referred somewhere else if he can get a sooner appt. Pt's wife said they will wait if they have too but rather go somewhere else. Pt's wife said she looked on his my chart online and she is worried it looks like cancer. Please advise [phone]

## 2013-05-17 NOTE — Telephone Encounter (Signed)
Message copied by Donata Duff on Tue May 17, 2013  8:04 AM ------      Message from: Rachael Fee      Created: Tue May 17, 2013  7:21 AM      Regarding: RE: EUS       Shelonda Saxe,      He needs upper EUS, radial and linear, for gastric mass, 60 min, next available MAC case at [facility] on EUS Thursday.              Tobi Bastos,      He should also be referred to general surgeon, is almost certainly going to require a surgery for the 18cm lesion.                    Thanks                              ----- Message -----         From: Donata Duff, CMA         Sent: 05/16/2013   2:52 PM           To: Rachael Fee, MD      Subject: Annell Greening: EUS                                                              ----- Message -----         From: Glendora Score         Sent: 05/16/2013   2:47 PM           To: Donata Duff, CMA      Subject: EUS                                                      Needs EUS with Dr. Christella Hartigan ASAP due to a Gastric Mass per Nurse Practioner Nira Retort       ------

## 2013-05-18 ENCOUNTER — Telehealth: Payer: Self-pay

## 2013-05-18 ENCOUNTER — Encounter (HOSPITAL_COMMUNITY): Payer: Self-pay | Admitting: *Deleted

## 2013-05-18 NOTE — Telephone Encounter (Signed)
Late entry: I spoke with Timothy Hall on several occasions yesterday. She is anxious, understandably, and she has inquired about any other providers that may have openings sooner. I told her that we also refer to Dr. Dulce Sellar, and I would check the availability. Spoke with [place], [phone]. She has cleared this with Dr. Dulce Sellar, who will proceed with an EUS +/- FNA biopsy on Nov 5.

## 2013-05-18 NOTE — Progress Notes (Signed)
Due to scheduling and patient's desire to have procedure done as soon as possible, he will be undergoing an EUS with Dr. Dulce Sellar on Nov 5 at 11:30. Spoke with Trula Ore at [phone], Dr. Hulen Shouts assistant, who has arranged this and spoken with patient.   Soledad Gerlach, can we please refer patient as well to [facility] in [place]? Reason: gastric mass, will likely need surgical intervention.

## 2013-05-18 NOTE — Telephone Encounter (Signed)
Dr Christella Hartigan just an FYI the pt cx and scheduled with Dr Dulce Sellar for a week sooner

## 2013-05-19 ENCOUNTER — Other Ambulatory Visit: Payer: Self-pay | Admitting: Gastroenterology

## 2013-05-19 ENCOUNTER — Encounter (HOSPITAL_COMMUNITY): Payer: Self-pay | Admitting: Pharmacy Technician

## 2013-05-19 DIAGNOSIS — K3189 Other diseases of stomach and duodenum: Secondary | ICD-10-CM

## 2013-05-19 NOTE — Progress Notes (Signed)
Referral has been sent to [facility] an they will contact Mr. Freida Busman for an appointment

## 2013-05-24 ENCOUNTER — Other Ambulatory Visit: Payer: Self-pay | Admitting: Gastroenterology

## 2013-05-24 NOTE — Addendum Note (Signed)
Addended by: Aquan Kope on: 05/24/2013 01:13 PM   Modules accepted: Orders  

## 2013-05-25 ENCOUNTER — Encounter (HOSPITAL_COMMUNITY): Payer: Self-pay

## 2013-05-25 ENCOUNTER — Ambulatory Visit (HOSPITAL_COMMUNITY): Payer: BC Managed Care – PPO | Admitting: Anesthesiology

## 2013-05-25 ENCOUNTER — Ambulatory Visit (HOSPITAL_COMMUNITY): Admit: 2013-05-25 | Payer: Self-pay | Admitting: Gastroenterology

## 2013-05-25 ENCOUNTER — Encounter (HOSPITAL_COMMUNITY): Payer: BC Managed Care – PPO | Admitting: Anesthesiology

## 2013-05-25 ENCOUNTER — Ambulatory Visit (HOSPITAL_COMMUNITY)
Admission: RE | Admit: 2013-05-25 | Discharge: 2013-05-25 | Disposition: A | Discharge status: Home / Self Care | Payer: BC Managed Care – PPO | Source: Ambulatory Visit | Attending: Gastroenterology | Admitting: Gastroenterology

## 2013-05-25 ENCOUNTER — Encounter (HOSPITAL_COMMUNITY)
Admission: RE | Disposition: A | Discharge status: Home / Self Care | Payer: Self-pay | Source: Ambulatory Visit | Attending: Gastroenterology

## 2013-05-25 DIAGNOSIS — I10 Essential (primary) hypertension: Secondary | ICD-10-CM | POA: Insufficient documentation

## 2013-05-25 DIAGNOSIS — G4730 Sleep apnea, unspecified: Secondary | ICD-10-CM | POA: Insufficient documentation

## 2013-05-25 DIAGNOSIS — R072 Precordial pain: Secondary | ICD-10-CM | POA: Insufficient documentation

## 2013-05-25 DIAGNOSIS — R42 Dizziness and giddiness: Secondary | ICD-10-CM | POA: Insufficient documentation

## 2013-05-25 DIAGNOSIS — K219 Gastro-esophageal reflux disease without esophagitis: Secondary | ICD-10-CM | POA: Insufficient documentation

## 2013-05-25 DIAGNOSIS — F17200 Nicotine dependence, unspecified, uncomplicated: Secondary | ICD-10-CM | POA: Insufficient documentation

## 2013-05-25 DIAGNOSIS — R1909 Other intra-abdominal and pelvic swelling, mass and lump: Secondary | ICD-10-CM | POA: Insufficient documentation

## 2013-05-25 HISTORY — PX: FINE NEEDLE ASPIRATION: SHX5430

## 2013-05-25 HISTORY — PX: EUS: SHX5427

## 2013-05-25 HISTORY — DX: Sleep apnea, unspecified: G47.30

## 2013-05-25 SURGERY — ESOPHAGEAL ENDOSCOPIC ULTRASOUND (EUS) RADIAL
Anesthesia: Monitor Anesthesia Care

## 2013-05-25 SURGERY — UPPER ENDOSCOPIC ULTRASOUND (EUS) LINEAR
Anesthesia: Monitor Anesthesia Care

## 2013-05-25 MED ORDER — PROPOFOL INFUSION 10 MG/ML OPTIME
INTRAVENOUS | Status: DC | PRN
  Administered 2013-05-25: 120 ug/kg/min via INTRAVENOUS

## 2013-05-25 MED ORDER — KETAMINE HCL 10 MG/ML IJ SOLN
INTRAMUSCULAR | Status: DC | PRN
  Administered 2013-05-25 (×5): 10 mg via INTRAVENOUS

## 2013-05-25 MED ORDER — BUTAMBEN-TETRACAINE-BENZOCAINE 2-2-14 % EX AERO
INHALATION_SPRAY | CUTANEOUS | Status: DC | PRN
  Administered 2013-05-25: 2 via TOPICAL

## 2013-05-25 MED ORDER — MIDAZOLAM HCL 5 MG/5ML IJ SOLN
INTRAMUSCULAR | Status: DC | PRN
  Administered 2013-05-25 (×2): 1 mg via INTRAVENOUS

## 2013-05-25 MED ORDER — SODIUM CHLORIDE 0.9 % IV SOLN: INTRAVENOUS | Status: DC

## 2013-05-25 MED ORDER — LACTATED RINGERS IV SOLN
INTRAVENOUS | Status: DC
  Administered 2013-05-25: 11:00:00 via INTRAVENOUS

## 2013-05-25 NOTE — Transfer of Care (Signed)
Immediate Anesthesia Transfer of Care Note  Patient: Timothy Hall  Procedure(s) Performed: Procedure(s): ESOPHAGEAL ENDOSCOPIC ULTRASOUND (EUS) RADIAL (N/A) FINE NEEDLE ASPIRATION (FNA) LINEAR (N/A)  Patient Location: PACU and Endoscopy Unit  Anesthesia Type:MAC  Level of Consciousness: awake, sedated and responds to stimulation  Airway & Oxygen Therapy: Patient Spontanous Breathing and Patient connected to nasal cannula oxygen  Post-op Assessment: Report given to PACU RN and Post -op Vital signs reviewed and stable  Post vital signs: Reviewed and stable  Complications: No apparent anesthesia complications

## 2013-05-25 NOTE — Op Note (Signed)
[facility] [address] [place], [address]   ENDOSCOPIC ULTRASOUND PROCEDURE REPORT PATIENT: Timothy Hall, Timothy Hall  MR#: [id number] BIRTHDATE: 1956/01/08  GENDER: Male ENDOSCOPIST: Willis Modena, MD REFERRED BY:  Roetta Sessions, M.D.  Joette Catching, M.D. PROCEDURE DATE:  05/25/2013 PROCEDURE:   Upper EUS ASA CLASS:      Class II INDICATIONS:   1.  abdominal cystic mass. MEDICATIONS: MAC sedation, administered by CRNA and Cetacaine spray x 2 DESCRIPTION OF PROCEDURE:   After the risks benefits and alternatives of the procedure were  explained, informed consent was obtained. The patient was then placed in the left, lateral, decubitus postion and IV sedation was administered. Throughout the procedure, the patients blood pressure, pulse and oxygen saturations were monitored continuously.  Under direct visualization, the radial echoendoscope was introduced through the mouth  and advanced to the second portion of the duodenum .   FINDINGS:      Large mass abutting, but does not appear to be involving, the inferoposterior margin of the stomach, measuring about 14cm x 14cm in size.  The lesion appears predominantly cystic, but upon high magnification, there appears to be solid components (or solid debris) with some septations and what appears to be at least one mural nodule.  The wall of the lesion does not appear to be significantly thickened.  It is unclear whether the lesion arises from the pancreas or the mesentery proper.  The pancreatic body and tail are compressed-appearing, and there extensive vascularization there, consistent with collateralization. There is no obvious gastric mass seen, endoscopically or ultrasonograophically, but there is some subtle extrinsic compression from the lesion.   The pancreatic head and uncinate have multiple features of chronic pancreatitis, including extensive lobularity.  No biliary of pancreatic ductal dilatation was  seen.   IMPRESSION:     As above.  Based on EUS findings, I do not suspect an instrinsic gastric lesion.  I suspect either a complex pancreatic cystic lesion (favored) or a complex mesenteric cystic lesion.  RECOMMENDATIONS:     1.  Watch for potential complications of procedure. 2.  I opted not to pursue aspiration of the lesion, as I feel this lesion will need surgical intervention regardless (due to its complex appearance, mural nodularity, sheer size of lesion with tendency to compress adjacent structures over time, lack of clear-cut diagnosis). 3.  Patient is to have surgical consult next week. _______________________________eSigned:  Willis Modena, MD 05/25/2013 12:03 PM  CC:

## 2013-05-25 NOTE — Anesthesia Preprocedure Evaluation (Signed)
Anesthesia Evaluation  Patient identified by MRN, date of birth, ID band Patient awake    Reviewed: Allergy & Precautions, H&P , NPO status , Patient's Chart, lab work & pertinent test results  Airway Mallampati: II TM Distance: >3 FB Neck ROM: Full    Dental  (+) Dental Advisory Given   Pulmonary sleep apnea , Current Smoker,  breath sounds clear to auscultation        Cardiovascular hypertension, + Valvular Problems/Murmurs Rhythm:Regular Rate:Normal     Neuro/Psych negative neurological ROS  negative psych ROS   GI/Hepatic Neg liver ROS, GERD-  Medicated,  Endo/Other  negative endocrine ROS  Renal/GU negative Renal ROS     Musculoskeletal negative musculoskeletal ROS (+)   Abdominal   Peds  Hematology negative hematology ROS (+)   Anesthesia Other Findings   Reproductive/Obstetrics negative OB ROS                           Anesthesia Physical Anesthesia Plan  ASA: II  Anesthesia Plan: MAC   Post-op Pain Management:    Induction: Intravenous  Airway Management Planned: Nasal Cannula  Additional Equipment:   Intra-op Plan:   Post-operative Plan:   Informed Consent: I have reviewed the patients History and Physical, chart, labs and discussed the procedure including the risks, benefits and alternatives for the proposed anesthesia with the patient or authorized representative who has indicated his/her understanding and acceptance.   Dental advisory given  Plan Discussed with: CRNA  Anesthesia Plan Comments:         Anesthesia Quick Evaluation

## 2013-05-25 NOTE — Anesthesia Postprocedure Evaluation (Signed)
Anesthesia Post Note  Patient: Timothy Hall  Procedure(s) Performed: Procedure(s) (LRB): ESOPHAGEAL ENDOSCOPIC ULTRASOUND (EUS) RADIAL (N/A) FINE NEEDLE ASPIRATION (FNA) LINEAR (N/A)  Anesthesia type: MAC  Patient location: PACU  Post pain: Pain level controlled  Post assessment: Post-op Vital signs reviewed  Last Vitals: BP 135/79  Temp(Src) 36.6 C (Oral)  Resp 13  SpO2 100%  Post vital signs: Reviewed  Level of consciousness: awake  Complications: No apparent anesthesia complications

## 2013-05-25 NOTE — Interval H&P Note (Signed)
History and Physical Interval Note:  05/25/2013 10:59 AM  Timothy Hall  has presented today for surgery, with the diagnosis of gastric mass  The various methods of treatment have been discussed with the patient and family. After consideration of risks, benefits and other options for treatment, the patient has consented to  Procedure(s): ESOPHAGEAL ENDOSCOPIC ULTRASOUND (EUS) RADIAL (N/A) FINE NEEDLE ASPIRATION (FNA) LINEAR (N/A) as a surgical intervention .  The patient's history has been reviewed, patient examined, no change in status, stable for surgery.  I have reviewed the patient's chart and labs.  Questions were answered to the patient's satisfaction.     Cherity Blickenstaff M  Assessment:  1.  Abdominal cystic mass, unclear etiology or significance.  Plan:  1.  Endoscopic ultrasound. 2.  Risks (bleeding, infection, bowel perforation that could require surgery, sedation-related changes in cardiopulmonary systems), benefits (identification and possible treatment of source of symptoms, exclusion of certain causes of symptoms), and alternatives (watchful waiting, radiographic imaging studies, empiric medical treatment) of upper endoscopy with ultrasound (EGD) were explained to patient/family in detail and patient wishes to proceed.

## 2013-05-25 NOTE — H&P (View-Only) (Signed)
PCP:   Josue Hector, MD   Chief Complaint:  Dizziness  HPI: 57 year old male came to the ED with dizziness which started almost 3 weeks ago and has been going off and on. Today patient says the dizziness lasted for long time and was associated with midsternal chest pain which started from middle of the back and radiated to the neck and chest. Patient says that the dizziness gets worse on standing and he has been off balance. Patient was seen at [facility] urgent care and was sent to the endocrine ED for further evaluation. In the ED patient underwent CT scan of the head as well as CT A of the chest and abdomen to rule out aortic dissection, the report is negative for any vascular abnormality except for a cystic gastric mass noted in the stomach. Patient says  pain is resolved at this time, he denies shortness of breath no nausea vomiting or diarrhea. Patient does have family history of heart disease. Patient is a smoker and has been smoking for last 40 years.  Allergies:  No Known Allergies    Past Medical History  Diagnosis Date  . GERD (gastroesophageal reflux disease)   . Heart murmur     Past Surgical History  Procedure Laterality Date  . 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     Prior to Admission medications   Medication Sig Start Date End Date Taking? Authorizing Provider  omeprazole (PRILOSEC) 20 MG capsule Take 20 mg by mouth daily as needed (for acid reflux).   Yes Historical Provider, MD    Social History:  reports that he has been smoking.  He does not have any smokeless tobacco history on file. He reports that he does not drink alcohol or use illicit drugs.  Family history is significant for coronary artery disease  All the positives are listed in BOLD  Review of Systems:  HEENT: Headache, blurred vision, runny nose, sore throat, dizziness Neck: Hypothyroidism, hyperthyroidism,,lymphadenopathy Chest : Shortness of breath, history of  COPD, Asthma Heart : Chest pain, history of coronary arterey disease GI:  Nausea, vomiting, diarrhea, constipation, GERD GU: Dysuria, urgency, frequency of urination, hematuria Neuro: Stroke, seizures, syncope Psych: Depression, anxiety, hallucinations   Physical Exam: Blood pressure 162/78, pulse 64, temperature 97.9 F (36.6 C), temperature source Oral, resp. rate 18, height 6' (1.829 m), weight 77.293 kg (170 lb 6.4 oz), SpO2 98.00%. Constitutional:   Patient is a well-developed and well-nourished male* in no acute distress and cooperative with exam. Head: Normocephalic and atraumatic Mouth: Mucus membranes moist Eyes: PERRL, EOMI, conjunctivae normal Neck: Supple, No Thyromegaly Cardiovascular: RRR, S1 normal, S2 normal Pulmonary/Chest: CTAB, no wheezes, rales, or rhonchi Abdominal: Soft. Non-tender, non-distended, bowel sounds are normal, no masses, organomegaly, or guarding present.  Neurological: A&O x3, Strenght is normal and symmetric bilaterally, cranial nerve II-XII are grossly intact, no focal motor deficit, sensory intact to light touch bilaterally.  Extremities : No Cyanosis, Clubbing or Edema Ethel Rana manuver done which is negative for dizziness  Labs on Admission:  Results for orders placed during the hospital encounter of 05/13/13 (from the past 48 hour(s))  CBC WITH DIFFERENTIAL     Status: Abnormal   Collection Time    05/13/13  7:23 PM      Result Value Range   WBC 10.8 (*) 4.0 - 10.5 K/uL   RBC 4.82  4.22 - 5.81 MIL/uL   Hemoglobin 14.6  13.0 - 17.0 g/dL  HCT 44.0  39.0 - 52.0 %   MCV 91.3  78.0 - 100.0 fL   MCH 30.3  26.0 - 34.0 pg   MCHC 33.2  30.0 - 36.0 g/dL   RDW 56.2  13.0 - 86.5 %   Platelets 316  150 - 400 K/uL   Neutrophils Relative % 71  43 - 77 %   Neutro Abs 7.7  1.7 - 7.7 K/uL   Lymphocytes Relative 22  12 - 46 %   Lymphs Abs 2.4  0.7 - 4.0 K/uL   Monocytes Relative 5  3 - 12 %   Monocytes Absolute 0.6  0.1 - 1.0 K/uL   Eosinophils  Relative 1  0 - 5 %   Eosinophils Absolute 0.1  0.0 - 0.7 K/uL   Basophils Relative 0  0 - 1 %   Basophils Absolute 0.0  0.0 - 0.1 K/uL  BASIC METABOLIC PANEL     Status: Abnormal   Collection Time    05/13/13  7:23 PM      Result Value Range   Sodium 136  135 - 145 mEq/L   Potassium 4.3  3.5 - 5.1 mEq/L   Chloride 101  96 - 112 mEq/L   CO2 26  19 - 32 mEq/L   Glucose, Bld 110 (*) 70 - 99 mg/dL   BUN 18  6 - 23 mg/dL   Creatinine, Ser 7.84  0.50 - 1.35 mg/dL   Calcium 9.8  8.4 - 69.6 mg/dL   GFR calc non Af Amer >90  >90 mL/min   GFR calc Af Amer >90  >90 mL/min   Comment: (NOTE)     The eGFR has been calculated using the CKD EPI equation.     This calculation has not been validated in all clinical situations.     eGFR's persistently <90 mL/min signify possible Chronic Kidney     Disease.  TROPONIN I     Status: None   Collection Time    05/13/13  7:23 PM      Result Value Range   Troponin I <0.30  <0.30 ng/mL   Comment:            Due to the release kinetics of cTnI,     a negative result within the first hours     of the onset of symptoms does not rule out     myocardial infarction with certainty.     If myocardial infarction is still suspected,     repeat the test at appropriate intervals.  PROTIME-INR     Status: None   Collection Time    05/13/13  7:23 PM      Result Value Range   Prothrombin Time 13.6  11.6 - 15.2 seconds   INR 1.06  0.00 - 1.49  APTT     Status: None   Collection Time    05/13/13  7:23 PM      Result Value Range   aPTT 29  24 - 37 seconds    Radiological Exams on Admission: Dg Chest 2 View  05/13/2013   CLINICAL DATA:  Chest pain, dizziness.  EXAM: CHEST  2 VIEW  COMPARISON:  None.  FINDINGS: The heart size and mediastinal contours are within normal limits. Both lungs are clear. The visualized skeletal structures are unremarkable.  IMPRESSION: No active cardiopulmonary disease.   Electronically Signed   By: Roque Lias M.D.   On: 05/13/2013  19:21   Ct Head Wo Contrast  05/13/2013   CLINICAL DATA:  Dizziness and chest pain today  EXAM: CT HEAD WITHOUT CONTRAST  TECHNIQUE: Contiguous axial images were obtained from the base of the skull through the vertex without intravenous contrast.  COMPARISON:  None.  FINDINGS: No mass lesion. No midline shift. No acute hemorrhage or hematoma. No extra-axial fluid collections. No evidence of acute infarction. There is scattered mild inflammatory change involving the paranasal sinuses. Calvarium is intact.  IMPRESSION: Mild sinusitis. No acute intracranial abnormalities.   Electronically Signed   By: Esperanza Heir M.D.   On: 05/13/2013 19:22   Ct Angio Chest Aorta W/cm &/or Wo/cm  05/13/2013   CLINICAL DATA:  Aortic dissection. Mid sternal chest pain. Upper back pain.  EXAM: CT ANGIOGRAPHY CHEST, ABDOMEN AND PELVIS  TECHNIQUE: Multidetector CT imaging through the chest, abdomen and pelvis was performed using the standard protocol during bolus administration of intravenous contrast. Multiplanar reconstructed images including MIPs were obtained and reviewed to evaluate the vascular anatomy.  CONTRAST:  OMNIPAQUE IOHEXOL 350 MG/ML SOLN  COMPARISON:  None.  FINDINGS: CTA CHEST FINDINGS  Noncontrast imaging of the thoracic aorta shows no evidence of intramural hematoma. 3 vessel aortic arch appears normal. Aortic branch vessels are normal. Pulmonary arteries appear within normal limits. No pulmonary emboli are identified. Heart grossly normal. No pericardial or pleural effusion. Thoracic esophagus appears normal. Trachea and central airways appear normal. No axillary, mediastinal, or hilar adenopathy. Bones appear within normal limits.  Review of the MIP images confirms the above findings.  CTA ABDOMEN AND PELVIS FINDINGS  The abdominal aorta is normal. The mesenteric vessels appear normal. Minimal iliofemoral atherosclerosis. The urinary bladder appears normal. There is no free fluid. Colonic  diverticulosis without diverticulitis. Liver appears within normal limits allowing for contrast phase. Spleen is normal. Small granulomatous calcification in the spleen is incidentally noted.  There is a very large upper abdominal mass that abuts and is intimately associated with the inferior gastric fundus. On axial imaging, the mass measures 18.6 cm transverse by 12.2 cm AP. Craniocaudal measurement is 16.8 cm. This has peripheral enhancement but is predominantly cystic with low attenuation fluid centrally (11 Hounsfield units). The findings are suspicious for neoplasm however a duplication cyst or on old pseudocyst could have a similar appearance. The adjacent pancreas appears normal. There is no mural calcification in the mass. There is no obstruction of the stomach. No aggressive osseous lesions are identified. Mild thoracic spondylosis.  Review of the MIP images confirms the above findings.  IMPRESSION: 1. Negative CTA of the chest. 2. No acute vascular abnormality in the abdomen or pelvis. 3. Cystic mass extending off the inferior margin of the stomach. The origin is unclear and this may represent a cystic mesenteric mass or an exophytic mass arising from the serosa of the gastric fundus. The peripheral enhancement raises the possibility of neoplasm. Other differential considerations include a mesenteric duplication cyst or pancreatic pseudocyst however the peripheral enhancement argues against a benign etiology.   Electronically Signed   By: Andreas Newport M.D.   On: 05/13/2013 21:42   Ct Angio Abd/pel W/ And/or W/o  05/13/2013   CLINICAL DATA:  Aortic dissection. Mid sternal chest pain. Upper back pain.  EXAM: CT ANGIOGRAPHY CHEST, ABDOMEN AND PELVIS  TECHNIQUE: Multidetector CT imaging through the chest, abdomen and pelvis was performed using the standard protocol during bolus administration of intravenous contrast. Multiplanar reconstructed images including MIPs were obtained and reviewed to evaluate  the vascular anatomy.  CONTRAST:   OMNIPAQUE IOHEXOL 350 MG/ML SOLN  COMPARISON:  None.  FINDINGS: CTA CHEST FINDINGS  Noncontrast imaging of the thoracic aorta shows no evidence of intramural hematoma. 3 vessel aortic arch appears normal. Aortic branch vessels are normal. Pulmonary arteries appear within normal limits. No pulmonary emboli are identified. Heart grossly normal. No pericardial or pleural effusion. Thoracic esophagus appears normal. Trachea and central airways appear normal. No axillary, mediastinal, or hilar adenopathy. Bones appear within normal limits.  Review of the MIP images confirms the above findings.  CTA ABDOMEN AND PELVIS FINDINGS  The abdominal aorta is normal. The mesenteric vessels appear normal. Minimal iliofemoral atherosclerosis. The urinary bladder appears normal. There is no free fluid. Colonic diverticulosis without diverticulitis. Liver appears within normal limits allowing for contrast phase. Spleen is normal. Small granulomatous calcification in the spleen is incidentally noted.  There is a very large upper abdominal mass that abuts and is intimately associated with the inferior gastric fundus. On axial imaging, the mass measures 18.6 cm transverse by 12.2 cm AP. Craniocaudal measurement is 16.8 cm. This has peripheral enhancement but is predominantly cystic with low attenuation fluid centrally (11 Hounsfield units). The findings are suspicious for neoplasm however a duplication cyst or on old pseudocyst could have a similar appearance. The adjacent pancreas appears normal. There is no mural calcification in the mass. There is no obstruction of the stomach. No aggressive osseous lesions are identified. Mild thoracic spondylosis.  Review of the MIP images confirms the above findings.  IMPRESSION: 1. Negative CTA of the chest. 2. No acute vascular abnormality in the abdomen or pelvis. 3. Cystic mass extending off the inferior margin of the stomach. The origin is unclear and  this may represent a cystic mesenteric mass or an exophytic mass arising from the serosa of the gastric fundus. The peripheral enhancement raises the possibility of neoplasm. Other differential considerations include a mesenteric duplication cyst or pancreatic pseudocyst however the peripheral enhancement argues against a benign etiology.   Electronically Signed   By: Andreas Newport M.D.   On: 05/13/2013 21:42    Assessment/Plan Active Problems:   Dizziness and giddiness   Chest pain   HTN (hypertension)   Gastric mass  Dizziness Most likely benign positional vertigo, as patient says that dizziness gets worse in certain positions. Will need to get an MRI to rule out underlying neurologic disorder. We'll start the patient on meclizine 25 mg by mouth 3 times a day.  Chest pain We'll admit him to telemetry and obtain 3 sets of cardiac enzymes. EKG shows normal sinus rhythm  Hypertension Patient's blood pressure is elevated, we'll start him on amlodipine 10 mg by mouth daily  Gastric mass CTA of the abdomen showed cystic mesenteric mass or an exophytic mass arising from the serosa of the gastric fundus. Patient will need further evaluation as outpatient.    Code status: Full code  Family discussion: Discussed with patient in detail   Time Spent on Admission: 75 min   LAMA,GAGAN S [facility] Pager: [phone] 05/14/2013, 12:22 AM  If 7PM-7AM, please contact night-coverage  [email or web address]  Password [email or web address]

## 2013-05-26 ENCOUNTER — Encounter (HOSPITAL_COMMUNITY): Payer: Self-pay | Admitting: Gastroenterology

## 2013-05-26 ENCOUNTER — Other Ambulatory Visit: Payer: Self-pay

## 2013-05-31 ENCOUNTER — Encounter (INDEPENDENT_AMBULATORY_CARE_PROVIDER_SITE_OTHER): Payer: Self-pay | Admitting: General Surgery

## 2013-05-31 ENCOUNTER — Ambulatory Visit (INDEPENDENT_AMBULATORY_CARE_PROVIDER_SITE_OTHER): Payer: BC Managed Care – PPO | Admitting: General Surgery

## 2013-05-31 VITALS — BP 142/80 | HR 68 | Temp 97.2°F | Resp 14 | Ht 72.0 in | Wt 189.0 lb

## 2013-05-31 DIAGNOSIS — K3189 Other diseases of stomach and duodenum: Secondary | ICD-10-CM

## 2013-05-31 DIAGNOSIS — K319 Disease of stomach and duodenum, unspecified: Secondary | ICD-10-CM

## 2013-05-31 NOTE — Progress Notes (Signed)
Patient ID: Timothy Hall, male   DOB: 05-15-1956, 57 y.o.   MRN: [id number]  Chief Complaint  Patient presents with  . New Evaluation    eval abd mass    HPI Timothy Hall is a 57 y.o. male.  He is referred by Gerrit Halls, NP who works with Dr. Roetta Sessions in [place].  He is referred for evaluation and surgical management of a large retrogastric cystic neoplasm. He is seen today with his wife in attendance.  The patient does not have any specific abdominal symptoms, although on questioning him and his wife they stated he's had progressive upper abdominal distention over the past 2-1/2 years. He has a history of alcoholism that lasted for 3 years but he has not had anything to drink since 1985. 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 .  About 2 weeks ago he presented to [facility] Emergency room with dizziness. He also noted some discomfort in his neck and shoulders and was found to be hypertensive. He had no GI symptoms at that time. Concern was for aortic dissection and pulmonary embolism CT angiogram of the chest and abdomen. There was no dissection or pulmonary embolism. The imaging studies have been reviewed and there is a large cystic mass measuring 18.6 cm transversely by 12.2 cm AP by 16.8 cm craniocaudal does have some peripheral enhancement but is mostly cystic, suspicious for neoplasm although duplication cyst or an old pseudocyst could have a similar appearance. The adjacent pancreas appears normal. There is no mural calcification of the mass. This appears to be adjacent to and possibly arising on the serosa of the posterior gastric wall.  He was referred to Dr. Dulce Sellar and endoscopy and endoscopic ultrasound were performed. He felt there was a large mass abutting but not necessarily invading the inferior posterior margin of the stomach predominantly cystic and some solid components or debris noted and at least one mural nodule. The pancreatic  body and tail were compressed. No obvious gastric mass. No mucosal abnormality but extrinsic compression from the retrogastric lesion noted. The pancreatic head and uncinate had multiple features of chronic pancreatitis including extensive lobularity. No biliary or pancreatic ductal dilatation was seen.  Once again. The patient has no history of nausea vomiting diarrhea constipation or weight loss. He has gained a little weight over the past couple of years. He notices the progressive painless distention.  Comorbidities include a past history of alcoholism. 2 pack a day smoker, GERD  Family History reveals father died of COPD. Mother living had breast cancer. One of his siblings had coronary bypass grafting.   HPI  Past Medical History  Diagnosis Date  . GERD (gastroesophageal reflux disease)   . Heart murmur   . Vertigo     has improved since med started 05-14-13  . Sleep apnea     no cpap used in yrs-unable to tolerate mask  . Substance abuse     Past Surgical History  Procedure Laterality Date  . Tumor removal      benign tumor removed from left breast and back area.   . Eus N/A 05/25/2013    Procedure: ESOPHAGEAL ENDOSCOPIC ULTRASOUND (EUS) RADIAL;  Surgeon: Willis Modena, MD;  Location: [facility];  Service: Endoscopy;  Laterality: N/A;  . Fine needle aspiration N/A 05/25/2013    Procedure: FINE NEEDLE ASPIRATION (FNA) LINEAR;  Surgeon: Willis Modena, MD;  Location: [facility];  Service: Endoscopy;  Laterality: N/A;  Family History  Problem Relation Age of Onset  . Colon cancer Neg Hx   . Stomach cancer Neg Hx   . Cancer Mother     breast    Social History History  Substance Use Topics  . Smoking status: Current Every Day Smoker -- 1.00 packs/day for 40 years    Types: Cigarettes  . Smokeless tobacco: Not on file     Comment: 1 to 1.5 packs per day   . Alcohol Use: No     Comment: quit in 1985    No Known Allergies  Current Outpatient Prescriptions   Medication Sig Dispense Refill  . aspirin EC 81 MG tablet Take 1 tablet (81 mg total) by mouth daily.  30 tablet  0  . meclizine (ANTIVERT) 25 MG tablet Take 25 mg by mouth 3 (three) times daily.      [name] omeprazole (PRILOSEC) 20 MG capsule Take 20 mg by mouth daily. Take 30 minutes before breakfast daily.       No current facility-administered medications for this visit.    Review of Systems Review of Systems  Constitutional: Negative for fever, chills and unexpected weight change.  HENT: Negative for congestion, hearing loss, sore throat, trouble swallowing and voice change.   Eyes: Negative for visual disturbance.  Respiratory: Positive for cough. Negative for wheezing.   Cardiovascular: Negative for chest pain, palpitations and leg swelling.  Gastrointestinal: Positive for abdominal distention. Negative for nausea, vomiting, abdominal pain, diarrhea, constipation, blood in stool, anal bleeding and rectal pain.  Genitourinary: Negative for hematuria and difficulty urinating.  Musculoskeletal: Negative for arthralgias.  Skin: Negative for rash and wound.  Neurological: Negative for seizures, syncope, weakness and headaches.  Hematological: Negative for adenopathy. Does not bruise/bleed easily.  Psychiatric/Behavioral: Negative for confusion.    Blood pressure 142/80, pulse 68, temperature 97.2 F (36.2 C), temperature source Temporal, resp. rate 14, height 6' (1.829 m), weight 189 lb (85.73 kg).  Physical Exam Physical Exam  Constitutional: He is oriented to person, place, and time. He appears well-developed and well-nourished. No distress.  HENT:  Head: Normocephalic.  Nose: Nose normal.  Mouth/Throat: No oropharyngeal exudate.  Eyes: Conjunctivae and EOM are normal. Pupils are equal, round, and reactive to light. Right eye exhibits no discharge. Left eye exhibits no discharge. No scleral icterus.  Neck: Normal range of motion. Neck supple. No JVD present. No tracheal deviation  present. No thyromegaly present.  Cardiovascular: Normal rate, regular rhythm, normal heart sounds and intact distal pulses.   No murmur heard. Pulmonary/Chest: Effort normal and breath sounds normal. No stridor. No respiratory distress. He has no wheezes. He has no rales. He exhibits no tenderness.  Scar below right nipple with concavity from previous breast surgery for benign disease.  Abdominal: Soft. Bowel sounds are normal. He exhibits no distension and no mass. There is no tenderness. There is no rebound and no guarding.  Upper abdomen is protuberant but without a well marginated mass. Dull to percussion. Liver and spleen not palpable. No mass or hernia otherwise.  Musculoskeletal: Normal range of motion. He exhibits no edema and no tenderness.  Lymphadenopathy:    He has no cervical adenopathy.  Neurological: He is alert and oriented to person, place, and time. He has normal reflexes. Coordination normal.  Skin: Skin is warm and dry. No rash noted. He is not diaphoretic. No erythema. No pallor.  Psychiatric: He has a normal mood and affect. His behavior is normal. Judgment and thought content normal.  Data Reviewed [facility]. The emergency department notes. Imaging studies. EUS from Dr. Dulce Sellar.  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 Differential diagnosis does include benign processes such as duplication cyst, mesenteric cyst, or pancreatic pseudocyst, especially considering his remote history of alcoholism, although historically this has arisen in the last 2-1/2 years.. However, considering the enhancement of the wall and the possibility of a mural nodule seen on EUS,I am concerned that this may be a cystic neoplasm either arising from the serosa of the posterior gastric wall or, less likely, the pancreas.   2 pack a day tobacco abuse  Remote history of significant alcohol abuse, patient relates no alcohol intake since  1985  History right subcutaneous mastectomy for benign disease  History GERD, moderately severe    Plan    I had a very long discussion with the patient and his wife. I discussed the differential diagnosis in detail.They are aware that this  may be benign or malignant. They are aware that this may be metastatic and unresectable. They're aware that if it is resectable, it may involve resection of gastric wall, pancreas, or spleen.   Will check CEA and CA 19-9  Present case at GI next Wednesday.  Will obtain MRI of abdomen with and without contrast to evaluate anatomic detail.  Will schedule for diagnostic laparoscopy, and if possible, en bloc resection of this cystic neoplasm. I suspect there will be very little room in the abdomen for laparoscopy, and it is highly likely that this will simply have to be converted to an open procedure for exposure.  I have explained in detail the indications, details, techniques, and numerous risk of the surgery. There weren't the risk of bleeding, infection, pancreatic leak or fistula, anastomotic leak, reoperation for complications, wound problems with infection hernia. They're aware that his smoking history significantly increase the risk of wound healing problems. Risk of cardiac and pulmonary and thromboembolic problems. They understand all of these issues. All their questions are answered. They agree with this plan.  I told them that this was an unusual case. I offered to refer them to one of the universities for a second opinion. They declined stating they would like to simply proceed with evaluation and treatment. There know they can call me back if they change their mind.         Angelia Mould. Derrell Lolling, M.D., [facility] General and Minimally invasive Surgery Breast and Colorectal Surgery Office:   [phone] Pager:   [phone]  05/31/2013, 12:25 PM

## 2013-05-31 NOTE — Patient Instructions (Signed)
You have a large cystic mass between the stomach and the pancreas. This may or may not be cancer.  He will be scheduled for an MRI of this area to better define the anatomy  You will have blood were drawn to test for tumor markers.  You will be scheduled for an operation to diagnose and to resect this area, if possible. Part of the surgery will be done laparoscopically. Some of that may have to be done through an open  Incision.  It is possible that part of the stomach or part of the pancreas may have to be removed with this. You will be hospitalized for at least one week.  You have been offered a second opinion at one of the universities, and at this time you has stated that you do not want to do that. If you change your mind please let me know.

## 2013-06-01 ENCOUNTER — Telehealth (INDEPENDENT_AMBULATORY_CARE_PROVIDER_SITE_OTHER): Payer: Self-pay

## 2013-06-01 ENCOUNTER — Ambulatory Visit (INDEPENDENT_AMBULATORY_CARE_PROVIDER_SITE_OTHER): Payer: BC Managed Care – PPO | Admitting: Cardiology

## 2013-06-01 ENCOUNTER — Encounter: Payer: Self-pay | Admitting: Cardiology

## 2013-06-01 VITALS — BP 162/86 | HR 65 | Ht 72.0 in | Wt 189.0 lb

## 2013-06-01 DIAGNOSIS — R011 Cardiac murmur, unspecified: Secondary | ICD-10-CM

## 2013-06-01 DIAGNOSIS — Z01810 Encounter for preprocedural cardiovascular examination: Secondary | ICD-10-CM

## 2013-06-01 DIAGNOSIS — I10 Essential (primary) hypertension: Secondary | ICD-10-CM

## 2013-06-01 LAB — CANCER ANTIGEN 19-9: CA 19-9: <1.2 U/mL (ref <35.0)

## 2013-06-01 LAB — CEA: CEA: 0.9 ng/mL (ref 0.0–5.0)

## 2013-06-01 NOTE — Assessment & Plan Note (Signed)
Likely benign at this point based on examination. Echocardiogram arranged.

## 2013-06-01 NOTE — Patient Instructions (Addendum)
TO whom this may concern:   Timothy Hall was here for physician apt today  06/01/13    C.Les Pou  RN

## 2013-06-01 NOTE — Assessment & Plan Note (Signed)
Blood pressure elevated today. He follows with Dr. [name]. Depending on blood pressure trend, he may need to be considered for additional medical therapy.

## 2013-06-01 NOTE — Assessment & Plan Note (Signed)
Patient recently diagnosed with large cystic gastric neoplasm, surgical intervention is planned in the near future with Dr. Derrell Lolling. From a cardiac perspective, Timothy Hall does have a history of premature CAD in his family, is an active tobacco user, but has no defined history of long-standing hypertension or hyperlipidemia. He is not reporting any reproducible exertional chest pain or breathlessness, did have some thoracic discomfort during his presentation in October when he was diagnosed with a gastric neoplasm. Cardiac markers and ECG were normal at that time, and he has had no recurrent symptoms. He describes activities well exceeding 4 METs without exertional symptoms, and should be able to proceed with planned surgery at an acceptable perioperative cardiac risk. I do think that a baseline echocardiogram would be useful with his history of murmur, although this sounds to be benign. We will plan to see him back after surgery to review symptoms.

## 2013-06-01 NOTE — Telephone Encounter (Signed)
Pt's wife calling to request lab results.  Dr. Derrell Lolling reports the CEA and the CA 19-9 are both normal.  Pt made aware.

## 2013-06-01 NOTE — Progress Notes (Signed)
Clinical Summary Timothy Hall is a 57 y.o.male referred for cardiology consultation by Dr. Kerry Hough following hospitalization at [facility] in late October. Records were reviewed. Patient was evaluated for dizziness, thoracic/back discomfort radiating to the neck. He ruled out for myocardial infarction, underwent a CT scan of the chest that did not demonstrate aortic dissection, but did document a large cystic gastric mass. He was subsequently evaluated by Dr. Jena Gauss and then referred for endoscopy (results described in Dr. Jacinto Halim recent note). He saw Dr. Derrell Lolling for surgical consultation yesterday with plans to proceed with surgery in the near future, followup MRI studies are pending.  Patient is here with his wife today. His presenting symptoms in October included a feeling of lightheadedness, discomfort in his epigastric area radiating up into the chest and also neck. He has had no recurrences of these symptoms in the last few weeks, is back at work doing his same activities. He has no personal history of CAD, although it brother with premature CAD in his 58s.  Recent ECG from October 24 reviewed showing normal sinus rhythm. Cardiac markers were normal during his recent hospitalization. He does report being told that he has a heart murmur. He denies any palpitations or syncope, has NYHA class 1-2 dyspnea at baseline. Describes activities well over 4 METs without regular exertional symptoms.  No Known Allergies  Current Outpatient Prescriptions  Medication Sig Dispense Refill  . aspirin EC 81 MG tablet Take 1 tablet (81 mg total) by mouth daily.  30 tablet  0  . meclizine (ANTIVERT) 25 MG tablet Take 25 mg by mouth 3 (three) times daily.      [name] omeprazole (PRILOSEC) 20 MG capsule Take 20 mg by mouth daily. Take 30 minutes before breakfast daily.       No current facility-administered medications for this visit.    Past Medical History  Diagnosis Date  . GERD (gastroesophageal reflux disease)     . Vertigo   . Sleep apnea     Intolerant of CPAP  . History of alcoholism     Quit drinking in 1985  . Gastric neoplasm     Cystic, 18 cm - recently diagnosed October 2014   . Heart murmur     Past Surgical History  Procedure Laterality Date  . Tumor removal      Benign tumor removed from left breast and back area.   . Eus N/A 05/25/2013    Procedure: ESOPHAGEAL ENDOSCOPIC ULTRASOUND (EUS) RADIAL;  Surgeon: Willis Modena, MD;  Location: [facility];  Service: Endoscopy;  Laterality: N/A;  . Fine needle aspiration N/A 05/25/2013    Procedure: FINE NEEDLE ASPIRATION (FNA) LINEAR;  Surgeon: Willis Modena, MD;  Location: [facility];  Service: Endoscopy;  Laterality: N/A;    Family History  Problem Relation Age of Onset  . Colon cancer Neg Hx   . Stomach cancer Neg Hx   . Breast cancer Mother   . CAD Brother     Premature disease    Social History Timothy Hall reports that he has been smoking Cigarettes.  He has a 40 pack-year smoking history. He does not have any smokeless tobacco history on file. Timothy Hall reports that he does not drink alcohol.  Review of Systems No cough, fevers or chills, orthopnea or PND, leg edema. Otherwise negative except as outlined.  Physical Examination Filed Vitals:   06/01/13 0929  BP: 162/86  Pulse: 65   Filed Weights   06/01/13 0929  Weight: 189 lb (  85.73 kg)   Patient appears comfortable at rest. HEENT: Conjunctiva and lids normal, oropharynx clear. Neck: Supple, no elevated JVP or carotid bruits, no thyromegaly. Lungs: Clear to auscultation, nonlabored breathing at rest. Cardiac: Regular rate and rhythm, no S3, soft basal systolic murmur, no pericardial rub. Abdomen: Soft, nontender, bowel sounds present, no guarding or rebound. Extremities: No pitting edema, distal pulses 2+. Skin: Warm and dry. Musculoskeletal: No kyphosis. Neuropsychiatric: Alert and oriented x3, affect grossly appropriate.   Problem List and Plan    Preoperative cardiovascular examination Patient recently diagnosed with large cystic gastric neoplasm, surgical intervention is planned in the near future with Dr. Derrell Lolling. From a cardiac perspective, Timothy Hall does have a history of premature CAD in his family, is an active tobacco user, but has no defined history of long-standing hypertension or hyperlipidemia. He is not reporting any reproducible exertional chest pain or breathlessness, did have some thoracic discomfort during his presentation in October when he was diagnosed with a gastric neoplasm. Cardiac markers and ECG were normal at that time, and he has had no recurrent symptoms. He describes activities well exceeding 4 METs without exertional symptoms, and should be able to proceed with planned surgery at an acceptable perioperative cardiac risk. I do think that a baseline echocardiogram would be useful with his history of murmur, although this sounds to be benign. We will plan to see him back after surgery to review symptoms.  Heart murmur Likely benign at this point based on examination. Echocardiogram arranged.  Essential hypertension, benign Blood pressure elevated today. He follows with Dr. [name]. Depending on blood pressure trend, he may need to be considered for additional medical therapy.    Jonelle Sidle, M.D., F.A.C.C.

## 2013-06-03 ENCOUNTER — Ambulatory Visit (HOSPITAL_COMMUNITY)
Admission: RE | Admit: 2013-06-03 | Discharge: 2013-06-03 | Disposition: A | Discharge status: Home / Self Care | Payer: BC Managed Care – PPO | Source: Ambulatory Visit | Attending: Cardiology | Admitting: Cardiology

## 2013-06-03 DIAGNOSIS — Z01810 Encounter for preprocedural cardiovascular examination: Secondary | ICD-10-CM

## 2013-06-03 DIAGNOSIS — I10 Essential (primary) hypertension: Secondary | ICD-10-CM | POA: Insufficient documentation

## 2013-06-03 DIAGNOSIS — R011 Cardiac murmur, unspecified: Secondary | ICD-10-CM

## 2013-06-03 DIAGNOSIS — F17200 Nicotine dependence, unspecified, uncomplicated: Secondary | ICD-10-CM | POA: Insufficient documentation

## 2013-06-03 NOTE — Progress Notes (Signed)
*  PRELIMINARY RESULTS* Echocardiogram 2D Echocardiogram has been performed.  Timothy Hall 06/03/2013, 10:33 AM

## 2013-06-06 ENCOUNTER — Ambulatory Visit
Admission: RE | Admit: 2013-06-06 | Discharge: 2013-06-06 | Disposition: A | Discharge status: Home / Self Care | Payer: BC Managed Care – PPO | Source: Ambulatory Visit | Attending: General Surgery | Admitting: General Surgery

## 2013-06-06 DIAGNOSIS — K3189 Other diseases of stomach and duodenum: Secondary | ICD-10-CM

## 2013-06-06 MED ORDER — GADOBENATE DIMEGLUMINE 529 MG/ML IV SOLN
17.0000 mL | Freq: Once | INTRAVENOUS | Status: AC | PRN
  Administered 2013-06-06: 17 mL via INTRAVENOUS

## 2013-06-09 ENCOUNTER — Telehealth (INDEPENDENT_AMBULATORY_CARE_PROVIDER_SITE_OTHER): Payer: Self-pay

## 2013-06-09 NOTE — Telephone Encounter (Signed)
Wife called back and was given below message.  She states understanding and agreeable with surgical date at this time.

## 2013-06-09 NOTE — Telephone Encounter (Signed)
Message copied by Ivory Broad on Thu Jun 09, 2013 11:07 AM ------      Message from: Marchia Bond      Created: Thu Jun 09, 2013  9:40 AM      Regarding: call pt       Can you call pt and inform him if its ok to wait till 12/30 to have his surgery.            Thanks,      Denny Peon ------

## 2013-06-09 NOTE — Telephone Encounter (Signed)
I called the pt and let him know Dr Derrell Lolling feels it is not a cancer and it will be ok to wait until 12/30.

## 2013-07-07 ENCOUNTER — Encounter (HOSPITAL_COMMUNITY): Payer: Self-pay | Admitting: Pharmacy Technician

## 2013-07-11 ENCOUNTER — Ambulatory Visit (HOSPITAL_COMMUNITY)
Admission: RE | Admit: 2013-07-11 | Discharge: 2013-07-11 | Disposition: A | Discharge status: Home / Self Care | Payer: BC Managed Care – PPO | Source: Ambulatory Visit | Attending: General Surgery | Admitting: General Surgery

## 2013-07-11 ENCOUNTER — Encounter (HOSPITAL_COMMUNITY)
Admission: RE | Admit: 2013-07-11 | Discharge: 2013-07-11 | Disposition: A | Discharge status: Home / Self Care | Payer: BC Managed Care – PPO | Source: Ambulatory Visit | Attending: General Surgery | Admitting: General Surgery

## 2013-07-11 ENCOUNTER — Encounter (HOSPITAL_COMMUNITY): Payer: Self-pay

## 2013-07-11 ENCOUNTER — Telehealth (INDEPENDENT_AMBULATORY_CARE_PROVIDER_SITE_OTHER): Payer: Self-pay

## 2013-07-11 DIAGNOSIS — Z01818 Encounter for other preprocedural examination: Secondary | ICD-10-CM | POA: Insufficient documentation

## 2013-07-11 DIAGNOSIS — Z01812 Encounter for preprocedural laboratory examination: Secondary | ICD-10-CM | POA: Insufficient documentation

## 2013-07-11 DIAGNOSIS — F17200 Nicotine dependence, unspecified, uncomplicated: Secondary | ICD-10-CM | POA: Insufficient documentation

## 2013-07-11 DIAGNOSIS — Z0183 Encounter for blood typing: Secondary | ICD-10-CM | POA: Insufficient documentation

## 2013-07-11 HISTORY — DX: Pneumonia, unspecified organism: J18.9

## 2013-07-11 LAB — PROTIME-INR
INR: 0.92 (ref 0.00–1.49)
Prothrombin Time: 12.2 seconds (ref 11.6–15.2)

## 2013-07-11 LAB — COMPREHENSIVE METABOLIC PANEL
ALT: 56 U/L — ABNORMAL HIGH (ref 0–53)
Albumin: 4 g/dL (ref 3.5–5.2)
Alkaline Phosphatase: 95 U/L (ref 39–117)
BUN: 14 mg/dL (ref 6–23)
CO2: 25 mEq/L (ref 19–32)
Calcium: 9.4 mg/dL (ref 8.4–10.5)
GFR calc Af Amer: >90 mL/min (ref >90)
GFR calc non Af Amer: >90 mL/min (ref >90)
Glucose, Bld: 106 mg/dL — ABNORMAL HIGH (ref 70–99)
Potassium: 4.3 mEq/L (ref 3.5–5.1)
Sodium: 138 mEq/L (ref 135–145)
Total Protein: 7.5 g/dL (ref 6.0–8.3)

## 2013-07-11 LAB — URINALYSIS, ROUTINE W REFLEX MICROSCOPIC
Glucose, UA: NEGATIVE mg/dL
Hgb urine dipstick: NEGATIVE
Leukocytes, UA: NEGATIVE
Specific Gravity, Urine: 1.021 (ref 1.005–1.030)
Urobilinogen, UA: 0.2 mg/dL (ref 0.0–1.0)

## 2013-07-11 LAB — CBC WITH DIFFERENTIAL/PLATELET
Basophils Relative: 0 % (ref 0–1)
Eosinophils Absolute: 0.3 10*3/uL (ref 0.0–0.7)
Eosinophils Relative: 4 % (ref 0–5)
Lymphs Abs: 3 10*3/uL (ref 0.7–4.0)
MCH: 29.8 pg (ref 26.0–34.0)
MCHC: 32.8 g/dL (ref 30.0–36.0)
Monocytes Relative: 6 % (ref 3–12)
Neutro Abs: 5.1 10*3/uL (ref 1.7–7.7)
Neutrophils Relative %: 57 % (ref 43–77)
Platelets: 320 10*3/uL (ref 150–400)
RBC: 5.16 MIL/uL (ref 4.22–5.81)
RDW: 12.8 % (ref 11.5–15.5)

## 2013-07-11 NOTE — Patient Instructions (Signed)
20 Timothy Hall  07/11/2013   Your procedure is scheduled on: 07/19/13  Report to [facility] at 5:30 AM.  Call this number if you have problems the morning of surgery [phone]: [phone]   Remember:    Do not eat food or drink liquids After Midnight.     Take these medicines the morning of surgery with A SIP OF WATER: prilosec, nasal spray   Do not wear jewelry, make-up or nail polish.  Do not wear lotions, powders, or perfumes. You may wear deodorant.  Do not shave 48 hours prior to surgery. Men may shave face and neck.  Do not bring valuables to the hospital.  Contacts, dentures or bridgework may not be worn into surgery.  Leave suitcase in the car. After surgery it may be brought to your room.  For patients admitted to the hospital, checkout time is 11:00 AM the day of discharge.   Please read over the following fact sheets that you were given: blood fact sheet  Birdie Sons, RN  pre op nurse call if needed [phone]    FAILURE TO FOLLOW THESE INSTRUCTIONS MAY RESULT IN CANCELLATION OF YOUR SURGERY   Patient Signature: ___________________________________________

## 2013-07-11 NOTE — Telephone Encounter (Signed)
Patient spouse called into office to check whether or not a Bowel Prep is needed for patients upcoming surgery on 07/18/13.  Per Dr. Derrell Lolling patient needs a one day bowel prep w/nulytely.  Bowel Prep will be at front desk for pick up.  Patient notified and will pick up tomorrow.

## 2013-07-12 ENCOUNTER — Encounter (INDEPENDENT_AMBULATORY_CARE_PROVIDER_SITE_OTHER): Payer: Self-pay | Admitting: *Deleted

## 2013-07-12 NOTE — Progress Notes (Signed)
EKG 05/16/13 on EPIC

## 2013-07-15 NOTE — H&P (Signed)
Timothy Hall   MRN:  [id number]   Description: 57 year old male  Provider: Ernestene Mention, MD  Department: [facility]          Diagnoses      Gastric mass    -  Primary      537.9             Reason for Visit      New Evaluation      eval abd mass               Current Vitals - Last Recorded      BP Pulse Temp(Src) Resp Ht Wt      142/80 68 97.2 F (36.2 C) (Temporal) 14 6' (1.829 m) 189 lb (85.73 kg)       BMI - 25.63 kg/m2                      History and Physical    Ernestene Mention, MD     Status: Signed            Patient ID: Timothy Hall, male   DOB: July 14, 1956, 57 y.o.   MRN: [id number]    Chief Complaint   Patient presents with   .  New Evaluation       eval abd mass             HPI Timothy Hall is a 57 y.o. male.  He is referred by Gerrit Halls, NP who works with Dr. Roetta Sessions in [place].  He is referred for evaluation and surgical management of a large retrogastric cystic neoplasm. He is seen today with his wife in attendance.   The patient does not have any specific abdominal symptoms, although on questioning him and his wife they stated he's had progressive upper abdominal distention over the past 2-1/2 years. He has a history of alcoholism that lasted for 3 years but he has not had anything to drink since 1985. 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 .   About 2 weeks ago he presented to [facility] Emergency room with dizziness. He also noted some discomfort in his neck and shoulders and was found to be hypertensive. He had no GI symptoms at that time. Concern was for aortic dissection and pulmonary embolism CT angiogram of the chest and abdomen. There was no dissection or pulmonary embolism. The imaging studies have been reviewed and there is a large cystic mass measuring 18.6 cm transversely by 12.2 cm AP by 16.8 cm craniocaudal does have some peripheral enhancement but is mostly  cystic, suspicious for neoplasm although duplication cyst or an old pseudocyst could have a similar appearance. The adjacent pancreas appears normal. There is no mural calcification of the mass. This appears to be adjacent to and possibly arising on the serosa of the posterior gastric wall.   He was referred to Dr. Dulce Sellar and endoscopy and endoscopic ultrasound were performed. He felt there was a large mass abutting but not necessarily invading the inferior posterior margin of the stomach predominantly cystic and some solid components or debris noted and at least one mural nodule. The pancreatic body and tail were compressed. No obvious gastric mass. No mucosal abnormality but extrinsic compression from the retrogastric lesion noted. The pancreatic head and uncinate had multiple features of chronic pancreatitis including extensive lobularity. No biliary or pancreatic ductal dilatation was seen.  Once again. The patient has no history of nausea vomiting diarrhea constipation or weight loss. He has gained a little weight over the past couple of years. He notices the progressive painless distention.   Comorbidities include a past history of alcoholism. 2 pack a day smoker, GERD   Family History reveals father died of COPD. Mother living had breast cancer. One of his siblings had coronary bypass grafting.        Past Medical History   Diagnosis  Date   .  GERD (gastroesophageal reflux disease)     .  Heart murmur     .  Vertigo         has improved since med started 05-14-13   .  Sleep apnea         no cpap used in yrs-unable to tolerate mask   .  Substance abuse           Past Surgical History   Procedure  Laterality  Date   .  Tumor removal           benign tumor removed from left breast and back area.    .  Eus  N/A  05/25/2013       Procedure: ESOPHAGEAL ENDOSCOPIC ULTRASOUND (EUS) RADIAL;  Surgeon: Willis Modena, MD;  Location: [facility];  Service: Endoscopy;  Laterality:  N/A;   .  Fine needle aspiration  N/A  05/25/2013       Procedure: FINE NEEDLE ASPIRATION (FNA) LINEAR;  Surgeon: Willis Modena, MD;  Location: [facility];  Service: Endoscopy;  Laterality: N/A;         Family History   Problem  Relation  Age of Onset   .  Colon cancer  Neg Hx     .  Stomach cancer  Neg Hx     .  Cancer  Mother         breast        Social History History   Substance Use Topics   .  Smoking status:  Current Every Day Smoker -- 1.00 packs/day for 40 years       Types:  Cigarettes   .  Smokeless tobacco:  Not on file         Comment: 1 to 1.5 packs per day    .  Alcohol Use:  No         Comment: quit in 1985        No Known Allergies    Current Outpatient Prescriptions   Medication  Sig  Dispense  Refill   .  aspirin EC 81 MG tablet  Take 1 tablet (81 mg total) by mouth daily.   30 tablet   0   .  meclizine (ANTIVERT) 25 MG tablet  Take 25 mg by mouth 3 (three) times daily.         [name]  omeprazole (PRILOSEC) 20 MG capsule  Take 20 mg by mouth daily. Take 30 minutes before breakfast daily.            Review of Systems   Constitutional: Negative for fever, chills and unexpected weight change.  HENT: Negative for congestion, hearing loss, sore throat, trouble swallowing and voice change.   Eyes: Negative for visual disturbance.  Respiratory: Positive for cough. Negative for wheezing.   Cardiovascular: Negative for chest pain, palpitations and leg swelling.  Gastrointestinal: Positive for abdominal distention. Negative for nausea, vomiting, abdominal pain, diarrhea, constipation, blood in stool, anal bleeding and rectal pain.  Genitourinary: Negative for hematuria and difficulty urinating.  Musculoskeletal: Negative for arthralgias.  Skin: Negative for rash and wound.  Neurological: Negative for seizures, syncope, weakness and headaches.  Hematological: Negative for adenopathy. Does not bruise/bleed easily.  Psychiatric/Behavioral: Negative for  confusion.      Blood pressure 142/80, pulse 68, temperature 97.2 F (36.2 C), temperature source Temporal, resp. rate 14, height 6' (1.829 m), weight 189 lb (85.73 kg).   Physical Exam  Constitutional: He is oriented to person, place, and time. He appears well-developed and well-nourished. No distress.  HENT:   Head: Normocephalic.   Nose: Nose normal.   Mouth/Throat: No oropharyngeal exudate.  Eyes: Conjunctivae and EOM are normal. Pupils are equal, round, and reactive to light. Right eye exhibits no discharge. Left eye exhibits no discharge. No scleral icterus.  Neck: Normal range of motion. Neck supple. No JVD present. No tracheal deviation present. No thyromegaly present.  Cardiovascular: Normal rate, regular rhythm, normal heart sounds and intact distal pulses.    No murmur heard. Pulmonary/Chest: Effort normal and breath sounds normal. No stridor. No respiratory distress. He has no wheezes. He has no rales. He exhibits no tenderness.  Scar below right nipple with concavity from previous breast surgery for benign disease.  Abdominal: Soft. Bowel sounds are normal. He exhibits no distension and no mass. There is no tenderness. There is no rebound and no guarding.  Upper abdomen is protuberant but without a well marginated mass. Dull to percussion. Liver and spleen not palpable. No mass or hernia otherwise.  Musculoskeletal: Normal range of motion. He exhibits no edema and no tenderness.  Lymphadenopathy:    He has no cervical adenopathy.  Neurological: He is alert and oriented to person, place, and time. He has normal reflexes. Coordination normal.  Skin: Skin is warm and dry. No rash noted. He is not diaphoretic. No erythema. No pallor.  Psychiatric: He has a normal mood and affect. His behavior is normal. Judgment and thought content normal.      Data Reviewed [facility] notes [facility]. The emergency department notes. Imaging studies. EUS from Dr.  Dulce Sellar.   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 Differential diagnosis does include benign processes such as duplication cyst, mesenteric cyst, or pancreatic pseudocyst, especially considering his remote history of alcoholism, although historically this has arisen in the last 2-1/2 years.. However, considering the enhancement of the wall and the possibility of a mural nodule seen on EUS,I am concerned that this may be a cystic neoplasm either arising from the serosa of the posterior gastric wall or, less likely, the pancreas.    2 pack a day tobacco abuse   Remote history of significant alcohol abuse, patient relates no alcohol intake since 1985   History right subcutaneous mastectomy for benign disease   History GERD, moderately severe     Plan    I had a very long discussion with the patient and his wife. I discussed the differential diagnosis in detail.They are aware that this  may be benign or malignant. They are aware that this may be metastatic and unresectable. They're aware that if it is resectable, it may involve resection of gastric wall, pancreas, or spleen.    Will check CEA and CA 19-9   Present case at GI next Wednesday.   Will obtain MRI of abdomen with and without contrast to evaluate anatomic detail.   Will schedule for diagnostic laparoscopy, and if possible, en bloc resection of this cystic  neoplasm. I suspect there will be very little room in the abdomen for laparoscopy, and it is highly likely that this will simply have to be converted to an open procedure for exposure.   I have explained in detail the indications, details, techniques, and numerous risk of the surgery. There weren't the risk of bleeding, infection, pancreatic leak or fistula, anastomotic leak, reoperation for complications, wound problems with infection hernia. They're aware that his smoking history significantly increase the risk of wound healing problems. Risk of cardiac  and pulmonary and thromboembolic problems. They understand all of these issues. All their questions are answered. They agree with this plan.   I told them that this was an unusual case. I offered to refer them to one of the universities for a second opinion. They declined stating they would like to simply proceed with evaluation and treatment. There know they can call me back if they change their mind.      Angelia Mould. Derrell Lolling, M.D., [facility] General and Minimally invasive Surgery Breast and Colorectal Surgery Office:   [phone]

## 2013-07-18 ENCOUNTER — Telehealth (INDEPENDENT_AMBULATORY_CARE_PROVIDER_SITE_OTHER): Payer: Self-pay | Admitting: *Deleted

## 2013-07-18 NOTE — Telephone Encounter (Signed)
Patient's wife called to state that patient has only been able to drink about 2/3 of his bowel prep due to nausea and vomiting.  Spoke to [place] and Dr. Derrell Lolling who state to have patient stop drinking, take Benadryl to help then if he feels like he can drink some more in 6 hours he can try again.  Wife states understanding of the plan and agreeable at this time.

## 2013-07-19 ENCOUNTER — Inpatient Hospital Stay (HOSPITAL_COMMUNITY)
Admission: RE | Admit: 2013-07-19 | Discharge: 2013-07-26 | DRG: 327 | Disposition: A | Discharge status: Home / Self Care | Payer: BC Managed Care – PPO | Source: Ambulatory Visit | Attending: General Surgery | Admitting: General Surgery

## 2013-07-19 ENCOUNTER — Encounter (HOSPITAL_COMMUNITY)
Admission: RE | Disposition: A | Discharge status: Home / Self Care | Payer: Self-pay | Source: Ambulatory Visit | Attending: General Surgery

## 2013-07-19 ENCOUNTER — Encounter (HOSPITAL_COMMUNITY): Payer: Self-pay | Admitting: *Deleted

## 2013-07-19 ENCOUNTER — Inpatient Hospital Stay (HOSPITAL_COMMUNITY): Payer: BC Managed Care – PPO | Admitting: Anesthesiology

## 2013-07-19 ENCOUNTER — Encounter (HOSPITAL_COMMUNITY): Payer: BC Managed Care – PPO | Admitting: Anesthesiology

## 2013-07-19 DIAGNOSIS — K929 Disease of digestive system, unspecified: Secondary | ICD-10-CM | POA: Diagnosis not present

## 2013-07-19 DIAGNOSIS — C169 Malignant neoplasm of stomach, unspecified: Principal | ICD-10-CM | POA: Diagnosis present

## 2013-07-19 DIAGNOSIS — F17200 Nicotine dependence, unspecified, uncomplicated: Secondary | ICD-10-CM | POA: Diagnosis present

## 2013-07-19 DIAGNOSIS — R011 Cardiac murmur, unspecified: Secondary | ICD-10-CM | POA: Diagnosis present

## 2013-07-19 DIAGNOSIS — R000 Tachycardia, unspecified: Secondary | ICD-10-CM | POA: Diagnosis not present

## 2013-07-19 DIAGNOSIS — IMO0002 Reserved for concepts with insufficient information to code with codable children: Secondary | ICD-10-CM | POA: Diagnosis not present

## 2013-07-19 DIAGNOSIS — G4730 Sleep apnea, unspecified: Secondary | ICD-10-CM | POA: Diagnosis present

## 2013-07-19 DIAGNOSIS — F17210 Nicotine dependence, cigarettes, uncomplicated: Secondary | ICD-10-CM

## 2013-07-19 DIAGNOSIS — Y836 Removal of other organ (partial) (total) as the cause of abnormal reaction of the patient, or of later complication, without mention of misadventure at the time of the procedure: Secondary | ICD-10-CM | POA: Diagnosis not present

## 2013-07-19 DIAGNOSIS — Z79899 Other long term (current) drug therapy: Secondary | ICD-10-CM

## 2013-07-19 DIAGNOSIS — Z7982 Long term (current) use of aspirin: Secondary | ICD-10-CM

## 2013-07-19 DIAGNOSIS — I519 Heart disease, unspecified: Secondary | ICD-10-CM | POA: Diagnosis not present

## 2013-07-19 DIAGNOSIS — K560 Paralytic ileus: Secondary | ICD-10-CM | POA: Diagnosis not present

## 2013-07-19 DIAGNOSIS — K219 Gastro-esophageal reflux disease without esophagitis: Secondary | ICD-10-CM | POA: Diagnosis present

## 2013-07-19 DIAGNOSIS — J9819 Other pulmonary collapse: Secondary | ICD-10-CM | POA: Diagnosis not present

## 2013-07-19 DIAGNOSIS — Z23 Encounter for immunization: Secondary | ICD-10-CM

## 2013-07-19 DIAGNOSIS — D490 Neoplasm of unspecified behavior of digestive system: Secondary | ICD-10-CM | POA: Diagnosis present

## 2013-07-19 DIAGNOSIS — F1021 Alcohol dependence, in remission: Secondary | ICD-10-CM

## 2013-07-19 HISTORY — PX: LAPAROSCOPIC GASTRECTOMY: SHX5894

## 2013-07-19 LAB — TYPE AND SCREEN
ABO/RH(D): O POS
Antibody Screen: NEGATIVE

## 2013-07-19 SURGERY — GASTRECTOMY, LAPAROSCOPIC
Anesthesia: General | Site: Abdomen

## 2013-07-19 MED ORDER — KETOROLAC TROMETHAMINE 30 MG/ML IJ SOLN: 15.0000 mg | Freq: Once | INTRAMUSCULAR | Status: DC | PRN

## 2013-07-19 MED ORDER — HYDROMORPHONE HCL PF 2 MG/ML IJ SOLN
INTRAMUSCULAR | Status: AC
  Filled 2013-07-19: qty 1

## 2013-07-19 MED ORDER — ONDANSETRON HCL 4 MG/2ML IJ SOLN: 4.0000 mg | Freq: Four times a day (QID) | INTRAMUSCULAR | Status: DC | PRN

## 2013-07-19 MED ORDER — CEFAZOLIN SODIUM-DEXTROSE 2-3 GM-% IV SOLR
2.0000 g | INTRAVENOUS | Status: AC
  Administered 2013-07-19: 2 g via INTRAVENOUS

## 2013-07-19 MED ORDER — SODIUM CHLORIDE 0.9 % IJ SOLN: 9.0000 mL | INTRAMUSCULAR | Status: DC | PRN

## 2013-07-19 MED ORDER — PANTOPRAZOLE SODIUM 40 MG IV SOLR
40.0000 mg | Freq: Two times a day (BID) | INTRAVENOUS | Status: DC
  Administered 2013-07-19 – 2013-07-24 (×12): 40 mg via INTRAVENOUS
  Filled 2013-07-19 (×14): qty 40

## 2013-07-19 MED ORDER — HYDROMORPHONE 0.3 MG/ML IV SOLN
INTRAVENOUS | Status: AC
  Filled 2013-07-19: qty 25

## 2013-07-19 MED ORDER — ATROPINE SULFATE 0.4 MG/ML IJ SOLN
INTRAMUSCULAR | Status: AC
  Filled 2013-07-19: qty 1

## 2013-07-19 MED ORDER — ROCURONIUM BROMIDE 100 MG/10ML IV SOLN
INTRAVENOUS | Status: DC | PRN
  Administered 2013-07-19: 20 mg via INTRAVENOUS
  Administered 2013-07-19: 60 mg via INTRAVENOUS

## 2013-07-19 MED ORDER — EPHEDRINE SULFATE 50 MG/ML IJ SOLN
INTRAMUSCULAR | Status: AC
  Filled 2013-07-19: qty 1

## 2013-07-19 MED ORDER — CEFAZOLIN SODIUM-DEXTROSE 2-3 GM-% IV SOLR
2.0000 g | Freq: Once | INTRAVENOUS | Status: AC
  Administered 2013-07-19: 2 g via INTRAVENOUS
  Filled 2013-07-19 (×2): qty 50

## 2013-07-19 MED ORDER — FENTANYL CITRATE 0.05 MG/ML IJ SOLN
INTRAMUSCULAR | Status: DC | PRN
  Administered 2013-07-19 (×2): 100 ug via INTRAVENOUS
  Administered 2013-07-19: 50 ug via INTRAVENOUS
  Administered 2013-07-19: 100 ug via INTRAVENOUS
  Administered 2013-07-19: 50 ug via INTRAVENOUS
  Administered 2013-07-19: 100 ug via INTRAVENOUS

## 2013-07-19 MED ORDER — MIDAZOLAM HCL 2 MG/2ML IJ SOLN
INTRAMUSCULAR | Status: AC
  Filled 2013-07-19: qty 2

## 2013-07-19 MED ORDER — LABETALOL HCL 5 MG/ML IV SOLN
5.0000 mg | INTRAVENOUS | Status: DC | PRN
  Administered 2013-07-19 (×2): 5 mg via INTRAVENOUS

## 2013-07-19 MED ORDER — LACTATED RINGERS IV SOLN
INTRAVENOUS | Status: DC | PRN
  Administered 2013-07-19 (×3): via INTRAVENOUS

## 2013-07-19 MED ORDER — TISSEEL VH 10 ML EX KIT
PACK | CUTANEOUS | Status: AC
  Filled 2013-07-19: qty 1

## 2013-07-19 MED ORDER — NEOSTIGMINE METHYLSULFATE 1 MG/ML IJ SOLN
INTRAMUSCULAR | Status: AC
  Filled 2013-07-19: qty 10

## 2013-07-19 MED ORDER — HYDROMORPHONE HCL PF 1 MG/ML IJ SOLN
INTRAMUSCULAR | Status: DC | PRN
  Administered 2013-07-19: 0.5 mg via INTRAVENOUS
  Administered 2013-07-19 (×2): .5 mg via INTRAVENOUS
  Administered 2013-07-19: 0.5 mg via INTRAVENOUS

## 2013-07-19 MED ORDER — ONDANSETRON HCL 4 MG/2ML IJ SOLN
INTRAMUSCULAR | Status: DC | PRN
  Administered 2013-07-19: 4 mg via INTRAVENOUS

## 2013-07-19 MED ORDER — PROPOFOL 10 MG/ML IV BOLUS
INTRAVENOUS | Status: DC | PRN
  Administered 2013-07-19: 200 mg via INTRAVENOUS

## 2013-07-19 MED ORDER — BUPIVACAINE HCL (PF) 0.5 % IJ SOLN
INTRAMUSCULAR | Status: AC
  Filled 2013-07-19: qty 30

## 2013-07-19 MED ORDER — LABETALOL HCL 5 MG/ML IV SOLN
INTRAVENOUS | Status: AC
  Filled 2013-07-19: qty 4

## 2013-07-19 MED ORDER — SODIUM CHLORIDE 0.9 % IJ SOLN
INTRAMUSCULAR | Status: AC
  Filled 2013-07-19: qty 10

## 2013-07-19 MED ORDER — PHENOL 1.4 % MT LIQD
1.0000 | OROMUCOSAL | Status: DC | PRN
  Administered 2013-07-20: 1 via OROMUCOSAL
  Filled 2013-07-19: qty 177

## 2013-07-19 MED ORDER — CEFAZOLIN SODIUM-DEXTROSE 2-3 GM-% IV SOLR
INTRAVENOUS | Status: AC
  Filled 2013-07-19: qty 50

## 2013-07-19 MED ORDER — GLYCOPYRROLATE 0.2 MG/ML IJ SOLN
INTRAMUSCULAR | Status: AC
  Filled 2013-07-19: qty 3

## 2013-07-19 MED ORDER — 0.9 % SODIUM CHLORIDE (POUR BTL) OPTIME
TOPICAL | Status: DC | PRN
  Administered 2013-07-19: 5000 mL

## 2013-07-19 MED ORDER — NALOXONE HCL 0.4 MG/ML IJ SOLN: 0.4000 mg | INTRAMUSCULAR | Status: DC | PRN

## 2013-07-19 MED ORDER — ONDANSETRON HCL 4 MG/2ML IJ SOLN
INTRAMUSCULAR | Status: AC
  Filled 2013-07-19: qty 2

## 2013-07-19 MED ORDER — LIDOCAINE HCL (PF) 2 % IJ SOLN
INTRAMUSCULAR | Status: DC | PRN
  Administered 2013-07-19: 75 mg via INTRADERMAL

## 2013-07-19 MED ORDER — FENTANYL CITRATE 0.05 MG/ML IJ SOLN
INTRAMUSCULAR | Status: AC
  Filled 2013-07-19: qty 5

## 2013-07-19 MED ORDER — GLYCOPYRROLATE 0.2 MG/ML IJ SOLN
INTRAMUSCULAR | Status: DC | PRN
  Administered 2013-07-19: 0.6 mg via INTRAVENOUS

## 2013-07-19 MED ORDER — ENOXAPARIN SODIUM 40 MG/0.4ML ~~LOC~~ SOLN
40.0000 mg | SUBCUTANEOUS | Status: DC
  Administered 2013-07-20 – 2013-07-25 (×6): 40 mg via SUBCUTANEOUS
  Filled 2013-07-19 (×9): qty 0.4

## 2013-07-19 MED ORDER — ONDANSETRON HCL 4 MG PO TABS: 4.0000 mg | ORAL_TABLET | Freq: Four times a day (QID) | ORAL | Status: DC | PRN

## 2013-07-19 MED ORDER — ROCURONIUM BROMIDE 100 MG/10ML IV SOLN
INTRAVENOUS | Status: AC
  Filled 2013-07-19: qty 1

## 2013-07-19 MED ORDER — NEOSTIGMINE METHYLSULFATE 1 MG/ML IJ SOLN
INTRAMUSCULAR | Status: DC | PRN
  Administered 2013-07-19: 5 mg via INTRAVENOUS

## 2013-07-19 MED ORDER — DIPHENHYDRAMINE HCL 12.5 MG/5ML PO ELIX: 12.5000 mg | ORAL_SOLUTION | Freq: Four times a day (QID) | ORAL | Status: DC | PRN

## 2013-07-19 MED ORDER — LABETALOL HCL 5 MG/ML IV SOLN
INTRAVENOUS | Status: DC | PRN
  Administered 2013-07-19: 5 mg via INTRAVENOUS

## 2013-07-19 MED ORDER — PNEUMOCOCCAL VAC POLYVALENT 25 MCG/0.5ML IJ INJ
0.5000 mL | INJECTION | INTRAMUSCULAR | Status: AC
  Administered 2013-07-23: 0.5 mL via INTRAMUSCULAR
  Filled 2013-07-19 (×3): qty 0.5

## 2013-07-19 MED ORDER — LACTATED RINGERS IV SOLN: INTRAVENOUS | Status: DC

## 2013-07-19 MED ORDER — MIDAZOLAM HCL 5 MG/5ML IJ SOLN
INTRAMUSCULAR | Status: DC | PRN
  Administered 2013-07-19: 2 mg via INTRAVENOUS

## 2013-07-19 MED ORDER — PROMETHAZINE HCL 25 MG/ML IJ SOLN: 6.2500 mg | INTRAMUSCULAR | Status: DC | PRN

## 2013-07-19 MED ORDER — HYDROMORPHONE HCL PF 1 MG/ML IJ SOLN
0.2500 mg | INTRAMUSCULAR | Status: DC | PRN
Start: 2013-07-19 — End: 2013-07-19

## 2013-07-19 MED ORDER — POTASSIUM CHLORIDE IN NACL 20-0.9 MEQ/L-% IV SOLN
INTRAVENOUS | Status: DC
  Administered 2013-07-19 – 2013-07-21 (×6): via INTRAVENOUS
  Filled 2013-07-19 (×7): qty 1000

## 2013-07-19 MED ORDER — LIDOCAINE HCL (CARDIAC) 20 MG/ML IV SOLN
INTRAVENOUS | Status: AC
  Filled 2013-07-19: qty 5

## 2013-07-19 MED ORDER — DIPHENHYDRAMINE HCL 50 MG/ML IJ SOLN: 12.5000 mg | Freq: Four times a day (QID) | INTRAMUSCULAR | Status: DC | PRN

## 2013-07-19 MED ORDER — PROPOFOL 10 MG/ML IV BOLUS
INTRAVENOUS | Status: AC
  Filled 2013-07-19: qty 20

## 2013-07-19 MED ORDER — HYDROMORPHONE 0.3 MG/ML IV SOLN
INTRAVENOUS | Status: DC
  Administered 2013-07-19: 1.5 mg via INTRAVENOUS
  Administered 2013-07-19: 0.9 mg via INTRAVENOUS
  Administered 2013-07-19: 0.3 mg via INTRAVENOUS
  Administered 2013-07-19: 1.7 mg via INTRAVENOUS
  Administered 2013-07-20: 1.19 mg via INTRAVENOUS
  Administered 2013-07-20: 0.3 mg via INTRAVENOUS
  Administered 2013-07-20: 0.6 mg via INTRAVENOUS
  Administered 2013-07-20: 0.3 mg via INTRAVENOUS
  Administered 2013-07-20: 1.2 mg via INTRAVENOUS
  Administered 2013-07-20: 07:00:00 via INTRAVENOUS
  Administered 2013-07-20: 0.831 mg via INTRAVENOUS
  Administered 2013-07-21: 0.6 mg via INTRAVENOUS
  Administered 2013-07-21: 0.9 mg via INTRAVENOUS
  Administered 2013-07-21: 2.4 mg via INTRAVENOUS
  Administered 2013-07-21: 2.52 mg via INTRAVENOUS
  Administered 2013-07-21: 3.9 mg via INTRAVENOUS
  Administered 2013-07-21: 1.2 mg via INTRAVENOUS
  Administered 2013-07-21: 12:00:00 via INTRAVENOUS
  Administered 2013-07-22: 0.6 mg via INTRAVENOUS
  Administered 2013-07-22: 06:00:00 via INTRAVENOUS
  Administered 2013-07-22: 0.3 mg via INTRAVENOUS
  Administered 2013-07-22: 0.733 mg via INTRAVENOUS
  Administered 2013-07-22: 0.3 mg via INTRAVENOUS
  Administered 2013-07-22 (×2): 0.6 mg via INTRAVENOUS
  Administered 2013-07-23: 0.3 mg via INTRAVENOUS
  Administered 2013-07-23: 0.6 mg via INTRAVENOUS
  Administered 2013-07-23: 13.35 mg via INTRAVENOUS
  Filled 2013-07-19 (×3): qty 25

## 2013-07-19 SURGICAL SUPPLY — 74 items
ADH SKN CLS APL DERMABOND .7 (GAUZE/BANDAGES/DRESSINGS)
APPLIER CLIP ROT 10 11.4 M/L (STAPLE)
APR CLP MED LRG 11.4X10 (STAPLE)
BAG SPEC RTRVL LRG 6X4 10 (ENDOMECHANICALS)
BLADE HEX COATED 2.75 (ELECTRODE) ×2 IMPLANT
CLAMP ENDO BABCK 10MM (STAPLE) IMPLANT
CLIP APPLIE ROT 10 11.4 M/L (STAPLE) IMPLANT
COVER MAYO STAND STRL (DRAPES) ×2 IMPLANT
DERMABOND ADVANCED (GAUZE/BANDAGES/DRESSINGS)
DERMABOND ADVANCED .7 DNX12 (GAUZE/BANDAGES/DRESSINGS) IMPLANT
DRAIN CHANNEL 19F RND (DRAIN) ×1 IMPLANT
DRAPE LAPAROSCOPIC ABDOMINAL (DRAPES) ×2 IMPLANT
DRAPE WARM FLUID 44X44 (DRAPE) ×2 IMPLANT
ELECT REM PT RETURN 9FT ADLT (ELECTROSURGICAL) ×2
ELECTRODE REM PT RTRN 9FT ADLT (ELECTROSURGICAL) ×1 IMPLANT
ENDOLOOP SUT PDS II  0 18 (SUTURE)
ENDOLOOP SUT PDS II 0 18 (SUTURE) IMPLANT
EVACUATOR SILICONE 100CC (DRAIN) ×1 IMPLANT
GLOVE BIO SURGEON STRL SZ7 (GLOVE) ×2 IMPLANT
GLOVE BIOGEL PI IND STRL 7.0 (GLOVE) ×1 IMPLANT
GLOVE BIOGEL PI IND STRL 7.5 (GLOVE) ×1 IMPLANT
GLOVE BIOGEL PI INDICATOR 7.0 (GLOVE) ×1
GLOVE BIOGEL PI INDICATOR 7.5 (GLOVE) ×1
GOWN PREVENTION PLUS LG XLONG (DISPOSABLE) ×4 IMPLANT
GOWN PREVENTION PLUS XLARGE (GOWN DISPOSABLE) ×2 IMPLANT
GOWN STRL REIN XL XLG (GOWN DISPOSABLE) ×2 IMPLANT
KIT BASIN OR (CUSTOM PROCEDURE TRAY) ×2 IMPLANT
LIGASURE IMPACT 36 18CM CVD LR (INSTRUMENTS) ×1 IMPLANT
MARKER SKIN DUAL TIP RULER LAB (MISCELLANEOUS) IMPLANT
NS IRRIG 1000ML POUR BTL (IV SOLUTION) ×2 IMPLANT
PENCIL BUTTON HOLSTER BLD 10FT (ELECTRODE) ×2 IMPLANT
POUCH SPECIMEN RETRIEVAL 10MM (ENDOMECHANICALS) IMPLANT
RELOAD PROXIMATE 75MM GREEN (ENDOMECHANICALS) ×6 IMPLANT
RELOAD STAPLE 75 4.5 GRN THCK (ENDOMECHANICALS) IMPLANT
SCALPEL HARMONIC ACE (MISCELLANEOUS) IMPLANT
SCISSORS LAP 5X35 DISP (ENDOMECHANICALS) ×1 IMPLANT
SET IRRIG TUBING LAPAROSCOPIC (IRRIGATION / IRRIGATOR) ×1 IMPLANT
SPONGE DRAIN TRACH 4X4 STRL 2S (GAUZE/BANDAGES/DRESSINGS) ×1 IMPLANT
SPONGE GAUZE 4X4 12PLY (GAUZE/BANDAGES/DRESSINGS) ×2 IMPLANT
SPONGE LAP 18X18 X RAY DECT (DISPOSABLE) ×5 IMPLANT
STAPLE ECHEON FLEX 60 POW ENDO (STAPLE) IMPLANT
STAPLER 90 3.5 STAND SLIM (STAPLE)
STAPLER 90 3.5 STD SLIM (STAPLE) IMPLANT
STAPLER PROXIMATE 75MM BLUE (STAPLE) ×1 IMPLANT
STAPLER VISISTAT 35W (STAPLE) ×2 IMPLANT
SUCTION POOLE TIP (SUCTIONS) ×1 IMPLANT
SUT ETHILON 2 0 PS N (SUTURE) ×1 IMPLANT
SUT MNCRL AB 4-0 PS2 18 (SUTURE) IMPLANT
SUT PDS AB 1 TP1 96 (SUTURE) ×2 IMPLANT
SUT SILK 2 0 (SUTURE)
SUT SILK 2 0 SH CR/8 (SUTURE) ×3 IMPLANT
SUT SILK 2-0 18XBRD TIE 12 (SUTURE) ×1 IMPLANT
SUT SILK 3 0 (SUTURE)
SUT SILK 3 0 SH CR/8 (SUTURE) ×1 IMPLANT
SUT SILK 3-0 18XBRD TIE 12 (SUTURE) IMPLANT
SUT VIC AB 2-0 SH 27 (SUTURE) ×2
SUT VIC AB 2-0 SH 27X BRD (SUTURE) IMPLANT
SUT VIC AB 3-0 SH 27 (SUTURE) ×2
SUT VIC AB 3-0 SH 27X BRD (SUTURE) IMPLANT
TAPE CLOTH SURG 4X10 WHT LF (GAUZE/BANDAGES/DRESSINGS) ×1 IMPLANT
TIP INNERVISION DETACH 40FR (MISCELLANEOUS) IMPLANT
TIP INNERVISION DETACH 50FR (MISCELLANEOUS) IMPLANT
TIP INNERVISION DETACH 56FR (MISCELLANEOUS) IMPLANT
TIPS INNERVISION DETACH 40FR (MISCELLANEOUS)
TOWEL OR 17X26 10 PK STRL BLUE (TOWEL DISPOSABLE) ×3 IMPLANT
TRAY FOLEY CATH 14FRSI W/METER (CATHETERS) ×1 IMPLANT
TRAY LAP CHOLE (CUSTOM PROCEDURE TRAY) ×2 IMPLANT
TROCAR BLADELESS OPT 5 75 (ENDOMECHANICALS) ×1 IMPLANT
TROCAR XCEL BLUNT TIP 100MML (ENDOMECHANICALS) IMPLANT
TROCAR XCEL NON-BLD 11X100MML (ENDOMECHANICALS) IMPLANT
TROCAR XCEL UNIV SLVE 11M 100M (ENDOMECHANICALS) IMPLANT
TUBING INSUFFLATION 10FT LAP (TUBING) ×1 IMPLANT
YANKAUER SUCT BULB TIP 10FT TU (MISCELLANEOUS) ×2 IMPLANT
YANKAUER SUCT BULB TIP NO VENT (SUCTIONS) ×1 IMPLANT

## 2013-07-19 NOTE — Anesthesia Preprocedure Evaluation (Signed)
Anesthesia Evaluation  Patient identified by MRN, date of birth, ID band Patient awake    Reviewed: Allergy & Precautions, H&P , NPO status , Patient's Chart, lab work & pertinent test results  Airway Mallampati: II TM Distance: >3 FB Neck ROM: Full    Dental no notable dental hx.    Pulmonary sleep apnea , pneumonia -, resolved, Current Smoker,  breath sounds clear to auscultation  Pulmonary exam normal       Cardiovascular Exercise Tolerance: Good hypertension, + Valvular Problems/Murmurs Rhythm:Regular Rate:Normal  ECHO and ECG reviewed.   Neuro/Psych negative neurological ROS  negative psych ROS   GI/Hepatic Neg liver ROS, GERD-  Medicated,  Endo/Other  negative endocrine ROS  Renal/GU negative Renal ROS  negative genitourinary   Musculoskeletal negative musculoskeletal ROS (+)   Abdominal   Peds negative pediatric ROS (+)  Hematology negative hematology ROS (+)   Anesthesia Other Findings   Reproductive/Obstetrics negative OB ROS                           Anesthesia Physical Anesthesia Plan  ASA: II  Anesthesia Plan: General   Post-op Pain Management:    Induction: Intravenous  Airway Management Planned: Oral ETT  Additional Equipment:   Intra-op Plan:   Post-operative Plan: Extubation in OR  Informed Consent: I have reviewed the patients History and Physical, chart, labs and discussed the procedure including the risks, benefits and alternatives for the proposed anesthesia with the patient or authorized representative who has indicated his/her understanding and acceptance.   Dental advisory given  Plan Discussed with: CRNA  Anesthesia Plan Comments:         Anesthesia Quick Evaluation

## 2013-07-19 NOTE — Transfer of Care (Signed)
Immediate Anesthesia Transfer of Care Note  Patient: Timothy Hall  Procedure(s) Performed: Procedure(s): RESECTION OF RETROGASTRIC CYSTIC NEOPLASM WITH PARTIAL GASTRECTOMY (N/A)  Patient Location: PACU  Anesthesia Type:General  Level of Consciousness: awake, sedated and responds to stimulation  Airway & Oxygen Therapy: Patient Spontanous Breathing and Patient connected to face mask oxygen  Post-op Assessment: Report given to PACU RN and Post -op Vital signs reviewed and stable  Post vital signs: Reviewed and stable  Complications: No apparent anesthesia complications

## 2013-07-19 NOTE — Anesthesia Postprocedure Evaluation (Signed)
  Anesthesia Post-op Note  Patient: Timothy Hall  Procedure(s) Performed: Procedure(s) (LRB): RESECTION OF RETROGASTRIC CYSTIC NEOPLASM WITH PARTIAL GASTRECTOMY (N/A)  Patient Location: PACU  Anesthesia Type: General  Level of Consciousness: awake and alert   Airway and Oxygen Therapy: Patient Spontanous Breathing  Post-op Pain: mild  Post-op Assessment: Post-op Vital signs reviewed, Patient's Cardiovascular Status Stable, Respiratory Function Stable, Patent Airway and No signs of Nausea or vomiting  Last Vitals:  Filed Vitals:   07/19/13 1015  BP: 173/85  Pulse: 65  Temp:   Resp: 8    Post-op Vital Signs: stable   Complications: No apparent anesthesia complications

## 2013-07-19 NOTE — Interval H&P Note (Signed)
History and Physical Interval Note:  07/19/2013 6:54 AM  Timothy Hall  has presented today for surgery, with the diagnosis of cystic neoplasm   The goals and the various methods of treatment have been discussed with the patient and family. After consideration of risks, benefits and other options for treatment, the patient has consented to  Procedure(s): LAPAROSCOPIC RESECT RETROGASTRIC CYST (N/A), probable open as a surgical intervention .  The patient's history has been reviewed, patient examined, no change in status, stable for surgery.  I have reviewed the patient's chart and labs.  Questions were answered to the patient's satisfaction.     Ernestene Mention

## 2013-07-19 NOTE — Op Note (Addendum)
Patient Name:           Timothy Hall   Date of Surgery:        07/19/2013  Pre op Diagnosis:      Retrogastric cystic neoplasm of uncertain significance  Post op Diagnosis:    Same  Procedure:                 Partial gastrectomy, resection giant retrogastric cystic neoplasm.  Surgeon:                     Angelia Mould. Derrell Lolling, M.D., FACS  Assistant:                      Almond Lint, M.D., FACS  Operative Indications:   Timothy Hall is a 57 y.o. male. He is referred by Dr. Roetta Sessions in [place]. He was referred for evaluation and surgical management of a large retrogastric cystic neoplasm.   The patient does not have any specific abdominal symptoms, although on questioning him and his wife they stated he's had progressive upper abdominal distention over the past 2-1/2 years. He has a history of alcoholism that lasted for 3 years but he has not had anything to drink since 1985. 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 .  About 6 weeks ago he presented to [facility] Emergency room with dizziness. He also noted some discomfort in his neck and shoulders and was found to be hypertensive. He had no GI symptoms at that time. Concern was for aortic dissection and pulmonary embolism CT angiogram of the chest and abdomen. There was no dissection or pulmonary embolism. The imaging studies have been reviewed and there is a large cystic mass measuring 18.6 cm transversely by 12.2 cm AP by 16.8 cm craniocaudal does have some peripheral enhancement but is mostly cystic, suspicious for neoplasm although duplication cyst or an old pseudocyst could have a similar appearance. The adjacent pancreas appears normal. There is no mural calcification of the mass. This appears to be adjacent to and possibly arising on the serosa of the posterior gastric wall. MRI was performed and shows similar findings. CEA and CA 19-9 tumor markers are normal. He was referred to Dr. Dulce Sellar and  endoscopy and endoscopic ultrasound were performed. He felt there was a large mass abutting but not necessarily invading the inferior posterior margin of the stomach predominantly cystic and some solid components or debris noted and at least one mural nodule. The pancreatic body and tail were compressed. No obvious gastric mass. No mucosal abnormality but extrinsic compression from the retrogastric lesion noted. The pancreatic head and uncinate had multiple features of chronic pancreatitis including extensive lobularity. No biliary or pancreatic ductal dilatation was seen.  Once again. The patient has no history of nausea vomiting diarrhea constipation or weight loss. He has gained a little weight over the past couple of years. He notices the progressive painless distention.  Comorbidities include a past history of alcoholism. 2 pack a day smoker, GERD   father died of COPD. Mother living had breast cancer.  He is brought to the operating room electively   Operative Findings:       There was a giant cystic neoplasm in the retrogastric space. This filled the lesser omentum. The omentum and transverse colon mesentery and posterior gastric wall were adherent to this. The tumor was at least 20 cm x 25 cm in size. The  cystic neoplasm was possibly invading  the posterior wall of the stomach so we had to resect part of the gastric wall to get it cleanly removed. Otherwise the tumor  dissected off of the transverse colon mesentery and the retroperitoneum reasonably well and without evidence of invasion of those structures.  There was no gross evidence of neoplasia elsewhere.  Procedure in Detail:          Following the induction of general endotracheal anesthesia a nasogastric tube and Foley catheter were placed, and the abdomen was prepped and draped in a sterile fashion. Surgical time out performed. Intravenous antibiotics were given.    An upper midline incision was made and ultimately had to be extended around  the umbilicus slightly. The fascia was incised in the midline. Self-retaining retractors were placed. After assessing the abdomen and pelvis we elected to preserve the omentum and mobilize it off of the transverse colon, with electrocautery and  with the LigaSure device. We slowly dissected the omentum off of the anterior surface of the cystic neoplasm. We then slowly dissected on the right and on the left and inferiorly to get the soft tissue adhesions off of the cystic mass. We were able to do this without rupturing the cyst. This left a long, thin linear defect in the transverse colon mesentery which was closed with numerous interrupted sutures of 2-0 silk.  We did not have to ligate any significant mesenteric vessels and the  colon had excellent blood supply and was pink and viable at the completion of the case. We were ultimately able to mobilize the inferior part of the cyst up into the wound and lifted upward which allowed [place] better access to the gastric wall. We could see that the cystic neoplasm was densely adherent to the greater curvature of the stomach posteriorly at the junction of the body and antrum. We took down the gastrocolic omentum around this area and then ultimately decided to do a partial gastrectomy with a stapling device. We lifted up the anterior wall of the stomach with a Babcock clamp and using a GIA stapler with a green load transected the anterior and inferior gastric wall transversely with stapling device. We then passed the specimen off the field. This provided a nice closure of the gastric wall but did not seem to compromise the lumen very much at all. We oversewed the staple line with a running imbricating suture of 2-0 Vicryl. Hemostasis was excellent at this point. The retroperitoneum was a little raw but there was no evidence of invasion or injury to the pancreas or spleen or kidney. We irrigated out the abdomen and pelvis thoroughly with several liters of saline. A few bleeders  were controlled with silk sutures and cautery. I chose to place a 59 [place] Blake drain in the retrogastric space and brought that out in the right upper quadrant, sutured to the skin with a nylon suture and connected to a suction bulb. The omentum looked viable although there was a central defect. This was large and we chose not to repair this. After removing all the irrigation fluid and ensuring that things were relatively hemostatic the omentum was returned to its anatomic position. The NG tube was positioned so that it was down in the antrum of the stomach. Midline fascia was closed with a running suture of #1 double-stranded PDS the skin closed skin staples. Bandages were placed the patient did recover in stable condition. EBL 200 cc or less. Counts correct. Consultations none.  Angelia Mould. Derrell Lolling, M.D., FACS General and Minimally Invasive Surgery Breast and Colorectal Surgery  07/19/2013 9:29 AM

## 2013-07-20 ENCOUNTER — Encounter (HOSPITAL_COMMUNITY): Payer: Self-pay | Admitting: General Surgery

## 2013-07-20 LAB — CBC
Hemoglobin: 11.9 g/dL — ABNORMAL LOW (ref 13.0–17.0)
MCH: 30.1 pg (ref 26.0–34.0)
MCHC: 32.8 g/dL (ref 30.0–36.0)
MCV: 91.9 fL (ref 78.0–100.0)
Platelets: 295 10*3/uL (ref 150–400)

## 2013-07-20 LAB — BASIC METABOLIC PANEL
BUN: 15 mg/dL (ref 6–23)
CO2: 24 mEq/L (ref 19–32)
Creatinine, Ser: 1.01 mg/dL (ref 0.50–1.35)
GFR calc Af Amer: >90 mL/min (ref >90)
GFR calc non Af Amer: 81 mL/min — ABNORMAL LOW (ref >90)
Glucose, Bld: 130 mg/dL — ABNORMAL HIGH (ref 70–99)
Potassium: 4.7 mEq/L (ref 3.7–5.3)

## 2013-07-20 MED ORDER — MENTHOL 3 MG MT LOZG
1.0000 | LOZENGE | OROMUCOSAL | Status: DC | PRN
  Filled 2013-07-20 (×2): qty 9

## 2013-07-20 MED ORDER — ALBUTEROL SULFATE (2.5 MG/3ML) 0.083% IN NEBU
2.5000 mg | INHALATION_SOLUTION | Freq: Four times a day (QID) | RESPIRATORY_TRACT | Status: DC
  Administered 2013-07-20 – 2013-07-21 (×5): 2.5 mg via RESPIRATORY_TRACT
  Filled 2013-07-20 (×8): qty 3

## 2013-07-20 MED ORDER — ALBUTEROL SULFATE (5 MG/ML) 0.5% IN NEBU
2.5000 mg | INHALATION_SOLUTION | Freq: Four times a day (QID) | RESPIRATORY_TRACT | Status: DC
  Filled 2013-07-20: qty 3

## 2013-07-20 NOTE — Care Management Note (Signed)
    Page 1 of 1   07/20/2013     11:55:05 AM   CARE MANAGEMENT NOTE 07/20/2013  Patient:  Timothy Hall, Timothy Hall   Account Number:  [id number]  Date Initiated:  07/20/2013  Documentation initiated by:  Lorenda Ishihara  Subjective/Objective Assessment:   57 yo male admitted s/p partial gastrectomy. PTA lived at home with spouse.     Action/Plan:   Home when stable   Anticipated DC Date:  07/23/2013   Anticipated DC Plan:  HOME/SELF CARE      DC Planning Services  CM consult      Choice offered to / List presented to:             Status of service:  Completed, signed off Medicare Important Message given?   (If response is "NO", the following Medicare IM given date fields will be blank) Date Medicare IM given:   Date Additional Medicare IM given:    Discharge Disposition:  HOME/SELF CARE  Per UR Regulation:  Reviewed for med. necessity/level of care/duration of stay  If discussed at Long Length of Stay Meetings, dates discussed:    Comments:

## 2013-07-20 NOTE — Progress Notes (Signed)
1 Day Post-Op  Subjective: Stable and alert.Oriented. Abdomen is appropriately sore, but PCA seems to be working reasonably well. He has been able to sleep off-and-on. Biggest complaint is difficulty clearing her upper airway secretions due to NG tube. He is a 2 pack per day smoker.  Urine output good. Vital signs stable. Borderline tachycardia. Lab work pending.  Objective: Vital signs in last 24 hours: Temp:  [95.5 F (35.3 C)-98.8 F (37.1 C)] 98.1 F (36.7 C) (12/31 0200) Pulse Rate:  [62-112] 112 (12/31 0200) Resp:  [7-18] 14 (12/31 0400) BP: (126-218)/(73-110) 158/94 mmHg (12/31 0200) SpO2:  [2 %-100 %] 98 % (12/31 0400) Weight:  [195 lb (88.451 kg)] 195 lb (88.451 kg) (12/30 0805)    Intake/Output from previous day: 12/30 0701 - 12/31 0700 In: 3949.3 [I.V.:3899.3; IV Piggyback:50] Out: 1164 [Urine:939; Drains:75; Blood:150] Intake/Output this shift: Total I/O In: 1000 [I.V.:1000] Out: 460 [Urine:450; Drains:10]  General appearance: alert. Appropriate. Mental status normal. Cooperative. Oriented. Mild distress from abdominal pain. Resp: clear to auscultation anteriorly. Diminished breath sounds at bases. No wheeze. Occasional rhonchi. Difficulty with taking a large breath. GI: abdomen firm. Bowel sounds absent. Appropriately tender. Wound clean and intact. JP drainage serosanguineous, low volume. NG bilious.  Lab Results:  No results found for this or any previous visit (from the past 24 hour(s)).   Studies/Results: @RISRSLT24 @  . enoxaparin (LOVENOX) injection  40 mg Subcutaneous Q24H  . HYDROmorphone PCA 0.3 mg/mL   Intravenous Q4H  . pantoprazole (PROTONIX) IV  40 mg Intravenous Q12H  . pneumococcal 23 valent vaccine  0.5 mL Intramuscular Tomorrow-1000     Assessment/Plan: s/p Procedure(s): RESECTION OF RETROGASTRIC CYSTIC NEOPLASM WITH PARTIAL GASTRECTOMY  POD #1. Stable. Expected ileus. Strict NPO due to gastric staple line. Mobilize out of  bed Incentive spirometry and HHN with albuterol Check labs Anticipate pathology will be out day after tomorrow.  Tobacco abuse. Monitor closely for pulmonary problems.  GERD. On Protonix.  DVT prophylaxis. Lovenox ordered.  @PROBHOSP @  LOS: 1 day    Charlea Nardo M 07/20/2013  . .prob

## 2013-07-21 DIAGNOSIS — G4730 Sleep apnea, unspecified: Secondary | ICD-10-CM | POA: Insufficient documentation

## 2013-07-21 DIAGNOSIS — F17210 Nicotine dependence, cigarettes, uncomplicated: Secondary | ICD-10-CM

## 2013-07-21 LAB — BASIC METABOLIC PANEL
BUN: 15 mg/dL (ref 6–23)
CO2: 23 mEq/L (ref 19–32)
Calcium: 8.7 mg/dL (ref 8.4–10.5)
Chloride: 99 mEq/L (ref 96–112)
Creatinine, Ser: 0.88 mg/dL (ref 0.50–1.35)
GFR calc Af Amer: >90 mL/min (ref >90)
GFR calc non Af Amer: >90 mL/min (ref >90)
Glucose, Bld: 103 mg/dL — ABNORMAL HIGH (ref 70–99)
Potassium: 4.7 mEq/L (ref 3.7–5.3)
Sodium: 134 mEq/L — ABNORMAL LOW (ref 137–147)

## 2013-07-21 LAB — CBC
HCT: 34 % — ABNORMAL LOW (ref 39.0–52.0)
Hemoglobin: 11 g/dL — ABNORMAL LOW (ref 13.0–17.0)
MCH: 29.7 pg (ref 26.0–34.0)
MCHC: 32.4 g/dL (ref 30.0–36.0)
MCV: 91.9 fL (ref 78.0–100.0)
Platelets: 281 10*3/uL (ref 150–400)
RBC: 3.7 MIL/uL — ABNORMAL LOW (ref 4.22–5.81)
RDW: 13 % (ref 11.5–15.5)
WBC: 16.9 10*3/uL — ABNORMAL HIGH (ref 4.0–10.5)

## 2013-07-21 MED ORDER — METOPROLOL TARTRATE 1 MG/ML IV SOLN
5.0000 mg | Freq: Four times a day (QID) | INTRAVENOUS | Status: DC
  Administered 2013-07-21 – 2013-07-24 (×16): 5 mg via INTRAVENOUS
  Filled 2013-07-21 (×21): qty 5

## 2013-07-21 MED ORDER — METOPROLOL TARTRATE 1 MG/ML IV SOLN
5.0000 mg | INTRAVENOUS | Status: DC | PRN
  Administered 2013-07-21 (×2): 5 mg via INTRAVENOUS
  Filled 2013-07-21 (×3): qty 5

## 2013-07-21 MED ORDER — LIP MEDEX EX OINT
1.0000 "application " | TOPICAL_OINTMENT | Freq: Two times a day (BID) | CUTANEOUS | Status: DC
  Administered 2013-07-21 – 2013-07-25 (×8): 1 via TOPICAL
  Filled 2013-07-21: qty 7

## 2013-07-21 MED ORDER — BISACODYL 10 MG RE SUPP: 10.0000 mg | Freq: Two times a day (BID) | RECTAL | Status: DC | PRN

## 2013-07-21 MED ORDER — LACTATED RINGERS IV SOLN
INTRAVENOUS | Status: DC
  Administered 2013-07-21 – 2013-07-24 (×4): via INTRAVENOUS

## 2013-07-21 MED ORDER — LACTATED RINGERS IV BOLUS (SEPSIS): 1000.0000 mL | Freq: Three times a day (TID) | INTRAVENOUS | Status: DC | PRN

## 2013-07-21 MED ORDER — METOCLOPRAMIDE HCL 5 MG/ML IJ SOLN: 5.0000 mg | Freq: Four times a day (QID) | INTRAMUSCULAR | Status: DC | PRN

## 2013-07-21 MED ORDER — ALBUTEROL SULFATE (2.5 MG/3ML) 0.083% IN NEBU
2.5000 mg | INHALATION_SOLUTION | Freq: Three times a day (TID) | RESPIRATORY_TRACT | Status: DC
  Administered 2013-07-21 – 2013-07-22 (×4): 2.5 mg via RESPIRATORY_TRACT
  Filled 2013-07-21 (×10): qty 3

## 2013-07-21 MED ORDER — ALUM & MAG HYDROXIDE-SIMETH 200-200-20 MG/5ML PO SUSP: 30.0000 mL | Freq: Four times a day (QID) | ORAL | Status: DC | PRN

## 2013-07-21 MED ORDER — MAGIC MOUTHWASH
15.0000 mL | Freq: Four times a day (QID) | ORAL | Status: DC | PRN
  Filled 2013-07-21: qty 15

## 2013-07-21 MED ORDER — NICOTINE 14 MG/24HR TD PT24
14.0000 mg | MEDICATED_PATCH | Freq: Every day | TRANSDERMAL | Status: DC
  Administered 2013-07-21 – 2013-07-25 (×5): 14 mg via TRANSDERMAL
  Filled 2013-07-21 (×6): qty 1

## 2013-07-21 MED ORDER — ACETAMINOPHEN 650 MG RE SUPP: 650.0000 mg | Freq: Four times a day (QID) | RECTAL | Status: DC | PRN

## 2013-07-21 MED ORDER — PROMETHAZINE HCL 25 MG/ML IJ SOLN: 12.5000 mg | Freq: Four times a day (QID) | INTRAMUSCULAR | Status: DC | PRN

## 2013-07-21 MED ORDER — FUROSEMIDE 10 MG/ML IJ SOLN
40.0000 mg | Freq: Once | INTRAMUSCULAR | Status: AC
  Administered 2013-07-21: 40 mg via INTRAVENOUS
  Filled 2013-07-21: qty 4

## 2013-07-21 MED ORDER — MAGIC MOUTHWASH
15.0000 mL | Freq: Four times a day (QID) | ORAL | Status: AC
  Administered 2013-07-21 – 2013-07-23 (×4): 15 mL via ORAL
  Filled 2013-07-21 (×12): qty 15

## 2013-07-21 NOTE — Progress Notes (Signed)
Pt's pulse rate sustained in range of 120-125. BP 140 78. Notified MD on call. Obtained order for metoprolol.  Will continue to monitor.

## 2013-07-21 NOTE — Progress Notes (Signed)
Timothy Hall [id number] 08/02/1955  CARE TEAM:  PCP: Sherrie Mustache, MD  Outpatient Care Team: Patient Care Team: Dione Housekeeper, MD as PCP - General (Family Medicine) Satira Sark, MD as Consulting Physician (Cardiology)  Inpatient Treatment Team: Treatment Team: Attending Provider: [name], MD; Technician: Leda Quail, NT; Registered Nurse: Kristopher Oppenheim, RN; Registered Nurse: Celedonio Savage, RN; Technician: Joesph July, NT   Subjective:  Inc HR w HTN last night  Does not like the NG tube.  Not not helping to be tolerable.  Staying in bed.  Not wanting.  Did not know he was supposed to do this.  Wife at bedside  Objective:  Vital signs:  Filed Vitals:   07/20/13 2323 07/21/13 0231 07/21/13 0540 07/21/13 0744  BP: 140/78  150/77   Pulse: 120  112   Temp:      TempSrc:   Oral   Resp:   16 17  Height:      Weight:      SpO2:  99% 97% 96%       Intake/Output   Yesterday:  12/31 0701 - 01/01 0700 In: 3016 [I.V.:3016] Out: 2810 [Urine:1550; Emesis/NG output:1250; Drains:10] This shift:     Bowel function:  Flatus: n  BM: n  Drain: Thick brown bilious NG tube output.  Physical Exam:  General: Pt awake/alert/oriented x4 in no acute distress Eyes: PERRL, normal EOM.  Sclera clear.  No icterus Neuro: CN II-XII intact w/o focal sensory/motor deficits. Lymph: No head/neck/groin lymphadenopathy Psych:  No delerium/psychosis/paranoia HENT: Normocephalic, Mucus membranes moist.  No thrush Neck: Supple, No tracheal deviation Chest: No chest wall pain w good excursion CV:  Pulses intact.  Regular rhythm MS: Normal AROM mjr joints.  No obvious deformity Abdomen: Obese.  Distended.  Mildly tender at clean dressings only.  No evidence of peritonitis.  No incarcerated hernias. Ext:  SCDs BLE.  No mjr edema.  No cyanosis Skin: No petechiae / purpura   Problem List:   Principal Problem:   Gastric neoplasm s/p  partial gastrectomy 07/19/2013 Active Problems:   Smokes 1.5 packs of cigarettes per day   Assessment  Timothy Hall  58 y.o. male  2 Days Post-Op  Procedure(s): RESECTION OF RETROGASTRIC CYSTIC NEOPLASM WITH PARTIAL GASTRECTOMY  Postoperative ileus status post partial gastrectomy for large mass.  Plan:  I believe his hypertension and tachycardia are due to inadequate pain control.  I again stressed and using the PCA.  Low-dose beta blocker in the meantime.  Mobilize as tolerated to help recovery.  He needs to get up more.  Decrease IV fluids.  Lasix x1 with pulmonary issues.  Keep him drier but follow lytes  Nicotine patch for tobacco dependence.  VTE prophylaxis- SCDs, heparin   The patient and his wife had many questions.  I tried to answer to their satisfaction.  He is very curious as to the size of the tumor and the pathology findings.  That noted will take some time to get the pathology back, but it is not change the current plan of getting him up moving around.  I updated the patient's status to the family.  Recommendations were made.  Questions were answered.  The family expressed understanding & appreciation.The patient should well and at the end of the visit, more satisfied with understanding the plan   [name], M.D., F.A.C.S. Gastrointestinal and Minimally Invasive Surgery [facility] [address] [place], [place] [address] [phone]  [phone] Main / Paging   07/21/2013   Results:   Labs: Results for orders placed during the hospital encounter of 07/19/13 (from the past 48 hour(s))  BASIC METABOLIC PANEL     Status: Abnormal   Collection Time    07/20/13  6:26 AM      Result Value Range   Sodium 137  137 - 147 mEq/L   Comment: Please note change in reference range.   Potassium 4.7  3.7 - 5.3 mEq/L   Comment: Please note change in reference range.   Chloride 101  96 - 112 mEq/L   CO2 24  19 - 32 mEq/L   Glucose, Bld  130 (*) 70 - 99 mg/dL   BUN 15  6 - 23 mg/dL   Creatinine, Ser 1.01  0.50 - 1.35 mg/dL   Calcium 8.5  8.4 - 10.5 mg/dL   GFR calc non Af Amer 81 (*) >90 mL/min   GFR calc Af Amer >90  >90 mL/min   Comment: (NOTE)     The eGFR has been calculated using the CKD EPI equation.     This calculation has not been validated in all clinical situations.     eGFR's persistently <90 mL/min signify possible Chronic Kidney     Disease.  CBC     Status: Abnormal   Collection Time    07/20/13  6:26 AM      Result Value Range   WBC 16.1 (*) 4.0 - 10.5 K/uL   RBC 3.95 (*) 4.22 - 5.81 MIL/uL   Hemoglobin 11.9 (*) 13.0 - 17.0 g/dL   HCT 36.3 (*) 39.0 - 52.0 %   MCV 91.9  78.0 - 100.0 fL   MCH 30.1  26.0 - 34.0 pg   MCHC 32.8  30.0 - 36.0 g/dL   RDW 12.8  11.5 - 15.5 %   Platelets 295  150 - 400 K/uL  CBC     Status: Abnormal   Collection Time    07/21/13  5:47 AM      Result Value Range   WBC 16.9 (*) 4.0 - 10.5 K/uL   RBC 3.70 (*) 4.22 - 5.81 MIL/uL   Hemoglobin 11.0 (*) 13.0 - 17.0 g/dL   HCT 34.0 (*) 39.0 - 52.0 %   MCV 91.9  78.0 - 100.0 fL   MCH 29.7  26.0 - 34.0 pg   MCHC 32.4  30.0 - 36.0 g/dL   RDW 13.0  11.5 - 15.5 %   Platelets 281  150 - 400 K/uL  BASIC METABOLIC PANEL     Status: Abnormal   Collection Time    07/21/13  5:47 AM      Result Value Range   Sodium 134 (*) 137 - 147 mEq/L   Comment: Please note change in reference range.   Potassium 4.7  3.7 - 5.3 mEq/L   Comment: Please note change in reference range.   Chloride 99  96 - 112 mEq/L   CO2 23  19 - 32 mEq/L   Glucose, Bld 103 (*) 70 - 99 mg/dL   BUN 15  6 - 23 mg/dL   Creatinine, Ser 0.88  0.50 - 1.35 mg/dL   Calcium 8.7  8.4 - 10.5 mg/dL   GFR calc non Af Amer >90  >90 mL/min   GFR calc Af Amer >90  >90 mL/min   Comment: (NOTE)     The eGFR has been calculated using the CKD EPI equation.  This calculation has not been validated in all clinical situations.     eGFR's persistently <90 mL/min signify possible  Chronic Kidney     Disease.    Imaging / Studies: No results found.  Medications / Allergies: per chart  Antibiotics: Anti-infectives   Start     Dose/Rate Route Frequency Ordered Stop   07/19/13 1400  ceFAZolin (ANCEF) IVPB 2 g/50 mL premix     2 g 100 mL/hr over 30 Minutes Intravenous  Once 07/19/13 1352 07/19/13 1827   07/19/13 0552  ceFAZolin (ANCEF) IVPB 2 g/50 mL premix     2 g 100 mL/hr over 30 Minutes Intravenous On call to O.R. 07/19/13 0552 07/19/13 0742       Note: This dictation was prepared with Dragon/digital dictation along with Smartphrase technology. Any transcriptional errors that result from this process are unintentional.

## 2013-07-22 LAB — BASIC METABOLIC PANEL
BUN: 25 mg/dL — ABNORMAL HIGH (ref 6–23)
CO2: 27 mEq/L (ref 19–32)
Calcium: 9 mg/dL (ref 8.4–10.5)
Chloride: 96 mEq/L (ref 96–112)
Creatinine, Ser: 0.88 mg/dL (ref 0.50–1.35)
GFR calc Af Amer: >90 mL/min (ref >90)
GFR calc non Af Amer: >90 mL/min (ref >90)
Glucose, Bld: 106 mg/dL — ABNORMAL HIGH (ref 70–99)
Potassium: 4.1 mEq/L (ref 3.7–5.3)
Sodium: 134 mEq/L — ABNORMAL LOW (ref 137–147)

## 2013-07-22 LAB — CBC
HCT: 32.3 % — ABNORMAL LOW (ref 39.0–52.0)
Hemoglobin: 10.7 g/dL — ABNORMAL LOW (ref 13.0–17.0)
MCH: 29.9 pg (ref 26.0–34.0)
MCHC: 33.1 g/dL (ref 30.0–36.0)
MCV: 90.2 fL (ref 78.0–100.0)
Platelets: 297 10*3/uL (ref 150–400)
RBC: 3.58 MIL/uL — AB (ref 4.22–5.81)
RDW: 13 % (ref 11.5–15.5)
WBC: 11.9 10*3/uL — ABNORMAL HIGH (ref 4.0–10.5)

## 2013-07-22 MED ORDER — METOCLOPRAMIDE HCL 5 MG/ML IJ SOLN
10.0000 mg | Freq: Three times a day (TID) | INTRAMUSCULAR | Status: DC
  Administered 2013-07-22 – 2013-07-24 (×8): 10 mg via INTRAVENOUS
  Filled 2013-07-22 (×8): qty 2

## 2013-07-22 NOTE — Progress Notes (Signed)
3 Days Post-Op  Subjective: Looks better. Sitting up. Ambulating frequently in the halls. Says he feels his stomach rumbling but no stool or flatus yet. NG drainage bilious but, but volume is much lower. NG tube extremely uncomfortable with throat and neck pain.  Still with intermittant regular tachycardia, despite lopressor. Denies chest pain or shortness of breath. Cough intermittently productive. Nicotine patch in place.  Objective: Vital signs in last 24 hours: Temp:  [98 F (36.7 C)-98.1 F (36.7 C)] 98.1 F (36.7 C) (01/02 0551) Pulse Rate:  [103-125] 112 (01/02 0551) Resp:  [12-17] 15 (01/02 0551) BP: (130-158)/(70-85) 141/84 mmHg (01/02 0551) SpO2:  [94 %-99 %] 97 % (01/02 0551)    Intake/Output from previous day: 01/01 0701 - 01/02 0700 In: 560.3 [I.V.:560.3] Out: 1910 [Urine:1450; Emesis/NG output:450; Drains:10] Intake/Output this shift: Total I/O In: -  Out: 705 [Urine:400; Emesis/NG output:300; Drains:5]  General appearance: alert. Cooperative. Oriented. Mental status normal. Mild distress. Does not look ill or toxic at all. Resp: lungs are basically clear to auscultation bilaterally anteriorly. Air movement at bases not as good with some egophony. No rhonchi or wheeze. GI: abdomen soft. Not really distended. Rare bowel sounds. Wound clean. JP drainage clear, serosanguineous, odorless, low-volume.  Lab Results:  No results found for this or any previous visit (from the past 24 hour(s)).   Studies/Results: @RISRSLT24 @  . albuterol  2.5 mg Nebulization TID  . enoxaparin (LOVENOX) injection  40 mg Subcutaneous Q24H  . HYDROmorphone PCA 0.3 mg/mL   Intravenous Q4H  . lip balm  1 application Topical BID  . magic mouthwash  15 mL Oral QID  . metoCLOPramide (REGLAN) injection  10 mg Intravenous Q8H  . metoprolol  5 mg Intravenous Q6H  . nicotine  14 mg Transdermal Daily  . pantoprazole (PROTONIX) IV  40 mg Intravenous Q12H  . pneumococcal 23 valent vaccine  0.5 mL  Intramuscular Tomorrow-1000     Assessment/Plan: s/p Procedure(s): RESECTION OF RETROGASTRIC CYSTIC NEOPLASM WITH PARTIAL GASTRECTOMY  POD #3. Stable. Looks better. Expected ileus. Discontinue NG tube and keep n.p.o. Continued to push ambulation. Place on IV Reglan Anticipate pathology will be out today IV at 50 cc per hour. Check labs.  Tachycardia despite beta blocker. Check EKG. Hopefully this will resolve once NG tube out and pain control improved. If not may need further evaluation. No cardiac symptoms.  Tobacco abuse. Monitor closely for pulmonary problems. On nicotine patch.  GERD. On Protonix.   DVT prophylaxis. Lovenox    @PROBHOSP @  LOS: 3 days    Onita Pfluger M. Dalbert Batman, M.D., [facility] General and Minimally invasive Surgery Breast and Colorectal Surgery Office:   [phone]   07/22/2013  . .prob

## 2013-07-23 LAB — BASIC METABOLIC PANEL
BUN: 27 mg/dL — AB (ref 6–23)
CO2: 24 mEq/L (ref 19–32)
Calcium: 9.2 mg/dL (ref 8.4–10.5)
Chloride: 100 mEq/L (ref 96–112)
Creatinine, Ser: 0.84 mg/dL (ref 0.50–1.35)
GFR calc Af Amer: >90 mL/min (ref >90)
GFR calc non Af Amer: >90 mL/min (ref >90)
GLUCOSE: 98 mg/dL (ref 70–99)
POTASSIUM: 4 meq/L (ref 3.7–5.3)
Sodium: 139 mEq/L (ref 137–147)

## 2013-07-23 LAB — CBC
HEMATOCRIT: 31.7 % — AB (ref 39.0–52.0)
HEMOGLOBIN: 10.4 g/dL — AB (ref 13.0–17.0)
MCH: 30.1 pg (ref 26.0–34.0)
MCHC: 32.8 g/dL (ref 30.0–36.0)
MCV: 91.9 fL (ref 78.0–100.0)
Platelets: 263 10*3/uL (ref 150–400)
RBC: 3.45 MIL/uL — ABNORMAL LOW (ref 4.22–5.81)
RDW: 13.2 % (ref 11.5–15.5)
WBC: 10.7 10*3/uL — ABNORMAL HIGH (ref 4.0–10.5)

## 2013-07-23 MED ORDER — ALBUTEROL SULFATE (2.5 MG/3ML) 0.083% IN NEBU: 2.5000 mg | INHALATION_SOLUTION | RESPIRATORY_TRACT | Status: DC | PRN

## 2013-07-23 NOTE — Progress Notes (Signed)
4 Days Post-Op  Subjective: Passing flatus and had a BM No nausea or bloating with NG out  Objective: Vital signs in last 24 hours: Temp:  [97.5 F (36.4 C)-98.1 F (36.7 C)] 97.7 F (36.5 C) (01/03 0600) Pulse Rate:  [93-100] 97 (01/03 0600) Resp:  [12-17] 17 (01/03 0600) BP: (130-151)/(73-92) 130/74 mmHg (01/03 0600) SpO2:  [96 %-100 %] 97 % (01/03 0600)    Intake/Output from previous day: 01/02 0701 - 01/03 0700 In: 1200 [I.V.:1200] Out: 1465 [Urine:1450; Drains:15] Intake/Output this shift:   Lungs clear Abdomen soft, drain now serosang with no bile tinge  Lab Results:   Recent Labs  07/22/13 0840 07/23/13 0533  WBC 11.9* 10.7*  HGB 10.7* 10.4*  HCT 32.3* 31.7*  PLT 297 263   BMET  Recent Labs  07/22/13 0840 07/23/13 0533  NA 134* 139  K 4.1 4.0  CL 96 100  CO2 27 24  GLUCOSE 106* 98  BUN 25* 27*  CREATININE 0.88 0.84  CALCIUM 9.0 9.2   PT/INR No results found for this basename: LABPROT, INR,  in the last 72 hours ABG No results found for this basename: PHART, PCO2, PO2, HCO3,  in the last 72 hours  Studies/Results: No results found.  Anti-infectives: Anti-infectives   Start     Dose/Rate Route Frequency Ordered Stop   07/19/13 1400  ceFAZolin (ANCEF) IVPB 2 g/50 mL premix     2 g 100 mL/hr over 30 Minutes Intravenous  Once 07/19/13 1352 07/19/13 1827   07/19/13 0552  ceFAZolin (ANCEF) IVPB 2 g/50 mL premix     2 g 100 mL/hr over 30 Minutes Intravenous On call to O.R. 07/19/13 0552 07/19/13 0742      Assessment/Plan: s/p Procedure(s): RESECTION OF RETROGASTRIC CYSTIC NEOPLASM WITH PARTIAL GASTRECTOMY (N/A)  Continue incentive spirometer Will start ice chips Out of bed  LOS: 4 days    Oliva Montecalvo A 07/23/2013

## 2013-07-24 MED ORDER — MORPHINE SULFATE 2 MG/ML IJ SOLN: 2.0000 mg | INTRAMUSCULAR | Status: DC | PRN

## 2013-07-24 MED ORDER — OXYCODONE-ACETAMINOPHEN 5-325 MG PO TABS
1.0000 | ORAL_TABLET | ORAL | Status: DC | PRN
  Administered 2013-07-24 – 2013-07-25 (×2): 1 via ORAL
  Filled 2013-07-24: qty 2
  Filled 2013-07-24: qty 1

## 2013-07-24 NOTE — Progress Notes (Signed)
5 Days Post-Op  Subjective: Comfortable No nausea, passing flatus, BM's  Objective: Vital signs in last 24 hours: Temp:  [98.1 F (36.7 C)-99.2 F (37.3 C)] 98.1 F (36.7 C) (01/04 0500) Pulse Rate:  [80-96] 80 (01/04 0500) Resp:  [14-20] 16 (01/04 0500) BP: (123-150)/(61-75) 143/70 mmHg (01/04 0500) SpO2:  [96 %-99 %] 96 % (01/04 0500) Last BM Date: 07/23/13  Intake/Output from previous day: 01/03 0701 - 01/04 0700 In: 658 [I.V.:658] Out: 205 [Urine:200; Drains:5] Intake/Output this shift:    Abdomen soft, drain sersang, incision clean  Lab Results:   Recent Labs  07/22/13 0840 07/23/13 0533  WBC 11.9* 10.7*  HGB 10.7* 10.4*  HCT 32.3* 31.7*  PLT 297 263   BMET  Recent Labs  07/22/13 0840 07/23/13 0533  NA 134* 139  K 4.1 4.0  CL 96 100  CO2 27 24  GLUCOSE 106* 98  BUN 25* 27*  CREATININE 0.88 0.84  CALCIUM 9.0 9.2   PT/INR No results found for this basename: LABPROT, INR,  in the last 72 hours ABG No results found for this basename: PHART, PCO2, PO2, HCO3,  in the last 72 hours  Studies/Results: No results found.  Anti-infectives: Anti-infectives   Start     Dose/Rate Route Frequency Ordered Stop   07/19/13 1400  ceFAZolin (ANCEF) IVPB 2 g/50 mL premix     2 g 100 mL/hr over 30 Minutes Intravenous  Once 07/19/13 1352 07/19/13 1827   07/19/13 0552  ceFAZolin (ANCEF) IVPB 2 g/50 mL premix     2 g 100 mL/hr over 30 Minutes Intravenous On call to O.R. 07/19/13 0552 07/19/13 0742      Assessment/Plan: s/p Procedure(s): RESECTION OF RETROGASTRIC CYSTIC NEOPLASM WITH PARTIAL GASTRECTOMY (N/A)  D/c PCA Start liquids   LOS: 5 days    Solara Goodchild A 07/24/2013

## 2013-07-25 MED ORDER — PANTOPRAZOLE SODIUM 40 MG PO TBEC
40.0000 mg | DELAYED_RELEASE_TABLET | Freq: Two times a day (BID) | ORAL | Status: DC
  Administered 2013-07-25 (×2): 40 mg via ORAL
  Filled 2013-07-25 (×4): qty 1

## 2013-07-25 NOTE — Progress Notes (Signed)
6 Days Post-Op  Subjective: Making good progress. Tolerating clear liquid diet. Passing some stool and flatus. No nausea. No reflux.   Final pathology report is still pending. Report is that this may be some type of GIST. I discussed this with him and his wife. Hopefully this will be out today.  Objective: Vital signs in last 24 hours: Temp:  [97.7 F (36.5 C)-98.7 F (37.1 C)] 98.7 F (37.1 C) (01/04 2300) Pulse Rate:  [76-84] 84 (01/04 2300) Resp:  [16-18] 18 (01/04 2300) BP: (133-146)/(68-81) 133/68 mmHg (01/04 2300) SpO2:  [93 %-95 %] 95 % (01/04 2300) Last BM Date: 07/24/13  Intake/Output from previous day: 01/04 0701 - 01/05 0700 In: 640 [P.O.:240; I.V.:400] Out: 220 [Urine:200; Drains:20] Intake/Output this shift: Total I/O In: 160 [I.V.:160] Out: 5 [Drains:5]   EXAM: General appearance: alert and cooperative. No distress. Mental status normal. Resp: clear to auscultation bilaterally GI: abdomen soft. Wound clean. No distention. Bowel sounds present. JP draining serosanguineous.  Lab Results:  No results found for this or any previous visit (from the past 24 hour(s)).   Studies/Results: @RISRSLT24 @  . enoxaparin (LOVENOX) injection  40 mg Subcutaneous Q24H  . lip balm  1 application Topical BID  . nicotine  14 mg Transdermal Daily  . pantoprazole  40 mg Oral BID     Assessment/Plan: s/p Procedure(s): RESECTION OF RETROGASTRIC CYSTIC NEOPLASM WITH PARTIAL GASTRECTOMY  POD #6. Partial gastrectomy and resection retroperitoneal cystic neoplasm. Advanced to low-residue diet Removed drain tomorrow if drainage benign. Discontinue Reglan Check pathology Probable home tomorrow  Tobacco abuse and atelectasis. Respiratory status stable. Discontinue albuterol nebs. Continue incentive spirometry  Tachycardia and hypertension. Probably pain related. Resolved. Discontinue routine beta blockers. Continue when necessary beta blockers  GERD. Switched to BID oral   Protonix   N. @PROBHOSP @  LOS: 6 days    Ophelia Sipe M 07/25/2013  . .prob

## 2013-07-26 ENCOUNTER — Telehealth (INDEPENDENT_AMBULATORY_CARE_PROVIDER_SITE_OTHER): Payer: Self-pay

## 2013-07-26 ENCOUNTER — Other Ambulatory Visit (INDEPENDENT_AMBULATORY_CARE_PROVIDER_SITE_OTHER): Payer: Self-pay

## 2013-07-26 DIAGNOSIS — C49A0 Gastrointestinal stromal tumor, unspecified site: Secondary | ICD-10-CM

## 2013-07-26 DIAGNOSIS — F17200 Nicotine dependence, unspecified, uncomplicated: Secondary | ICD-10-CM

## 2013-07-26 DIAGNOSIS — Z903 Acquired absence of stomach [part of]: Secondary | ICD-10-CM

## 2013-07-26 MED ORDER — HYDROCODONE-ACETAMINOPHEN 5-325 MG PO TABS: 1.0000 | ORAL_TABLET | ORAL | Status: DC | PRN

## 2013-07-26 MED ORDER — NICOTINE 21 MG/24HR TD PT24: 21.0000 mg | MEDICATED_PATCH | Freq: Every day | TRANSDERMAL | Status: DC

## 2013-07-26 NOTE — Telephone Encounter (Signed)
Pt wife called in asking for script for nicoderm patch be called into [facility] in [place] # [phone]. Spoke to dr Dalbert Batman who okayed 1 prescription. They are to follow up for any further refills with PCP. Pt wife understands.

## 2013-07-26 NOTE — Discharge Instructions (Signed)
See above

## 2013-07-26 NOTE — Discharge Summary (Addendum)
Patient ID: Timothy Hall [id number] 57 y.o. 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  Tobacco abuse GERD Remote history of alcohol abuse,  No alcohol intake since 1985. Postoperative tachycardia and hypertension, resolved, but to be secondary to pain and anxiety.  Operations: Procedure(s): RESECTION OF RETROGASTRIC CYSTIC NEOPLASM WITH PARTIAL GASTRECTOMY  Admission Condition: good  Discharged Condition: good  Indication for Admission: Timothy Hall is a 58 y.o. male. He is referred by Dr. Garfield Cornea in [place]. He was referred for evaluation and surgical management of a large retrogastric cystic neoplasm.  The patient does not have any specific abdominal symptoms, although on questioning him and his wife they stated he's had progressive upper abdominal distention over the past 2-1/2 years. He has a history of alcoholism that lasted for 3 years but he has not had anything to drink since 1985. 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 .  About 6 weeks ago he presented to [facility] Emergency room with dizziness. He also noted some discomfort in his neck and shoulders and was found to be hypertensive. He had no GI symptoms at that time. Concern was for aortic dissection and pulmonary embolism CT angiogram of the chest and abdomen. There was no dissection or pulmonary embolism. The imaging studies have been reviewed and there is a large cystic mass measuring 18.6 cm transversely by 12.2 cm AP by 16.8 cm craniocaudal does have some peripheral enhancement but is mostly cystic, suspicious for  neoplasm although duplication cyst or an old pseudocyst could have a similar appearance. The adjacent pancreas appears normal. There is no mural calcification of the mass. This appears to be adjacent to and possibly arising on the serosa of the posterior gastric wall. MRI was performed and shows similar findings. CEA and CA 19-9 tumor markers are normal.  He was referred to Dr. [name] and endoscopy and endoscopic ultrasound were performed. He felt there was a large mass abutting but not necessarily invading the inferior posterior margin of the stomach predominantly cystic and some solid components or debris noted and at least one mural nodule. The pancreatic body and tail were compressed. No obvious gastric mass. No mucosal abnormality but extrinsic compression from the retrogastric lesion noted. The pancreatic head and uncinate had multiple features of chronic pancreatitis including extensive lobularity. No biliary or pancreatic ductal dilatation was seen.  Once again. The patient has no history of nausea vomiting diarrhea constipation or weight loss. He has gained a little weight over the past couple of years. He notices the progressive painless distention.  Comorbidities include a past history of alcoholism. 2 pack a day smoker, GERD  father died of COPD. Mother living had breast cancer.  He is brought to the operating room electively   Hospital Course: On the day of admission the patient was taken to the operating room. Upper midline incision was made and a giant cystic neoplasm of the lesser omental bursa and retrogastric area was resected.  This appeared to be invading the posterior wall of the stomach at the junction between the body and antrum necessitating wedge resection of that portion of the stomach. It was adherent to the  retroperitoneum and the transverse colon mesentery but was not invading those structures. This was removed intact. There was no sign of any other tumor elsewhere in the  abdomen. The omentum was preserved. The transverse colon mesentery was repaired.  Postoperatively the patient did fairly well. He was miserable with a nasogastric tube. Tachycardia and hypertension which was well-controlled with beta blockers EKG showed no acute change. Nicotine patch was placed and this also helped.This resolved after discontinuing the NG tube on the third postop day. He progressed in his diet and activities and at the time of discharge was tolerating a low-residue diet, voiding uneventfully, and feeling in the Hall, and having bowel movements.He was weaned off of beta blockers.  Final pathology report is not printable  yet, but I have discussed this with Dr. Claudette Laws. He states that this is a gastrointestinal stromal tumor. He states that the mitotic rate is quite low, but because of the size of the cyst it will have to be signed out as a high risk tumor.The tumor is C-Kit positive. The margins are negative. I discussed these findings with the patient and his wife. I advised referral to a medical oncologist for discussion as to whether he should receive adjuvant Gleevec and he is in full agreement with this.  At the time of discharge the patient's abdomen was soft. Midline wound was healing without any sign of infection. The right upper quadrant drain was draining serosanguineous fluid that was removed. He was advised to see his primary care physician within a week or 2 to check his blood pressure. At the time of discharge he is normotensive and with a normal heart rate. He was advised to continue his proton pump inhibitors on a daily basis. He was told to resume his low-dose aspirin tomorrow. He was given a prescription for hydrocodone.  He is asked to return to see me in the office in one week for staple removal. Ambulatory referral to Dr. Newman Nickels she'll will be made for medical oncology consultation. He was about to get a followup endoscopy in one year. I will continue to follow  the patient as an outpatient. He was strongly urged to discontinue smoking.Diet and activities were discussed.  Consults: None  Significant Diagnostic Studies: Surgical pathology  Treatments: surgery: Expiratory laparotomy, partial gastrectomy, resection of cystic neoplasm.  Disposition: Home  Patient Instructions:    Medication List         aspirin EC 81 MG tablet  Take 1 tablet (81 mg total) by mouth daily.     HYDROcodone-acetaminophen 5-325 MG per tablet  Commonly known as:  NORCO/VICODIN  Take 1-2 tablets by mouth every 4 (four) hours as needed.     meclizine 25 MG tablet  Commonly known as:  ANTIVERT  Take 25 mg by mouth 3 (three) times daily.     Nasal Spray Bottle Misc  Place 2 sprays into both nostrils 2 (two) times daily before a meal. dyna mist nasal spray     omeprazole 20 MG capsule  Commonly known as:  PRILOSEC  Take 20 mg by mouth daily.        Activity: frequent ambulation. No sports or heavy lifting for 5 weeks. Diet: high-fiber, low-fat diet. Emphasis on hydration. Wound Care: bandage on drain site until drainage stops. Okay to shower.  Follow-up:  With [name] in 1 week.  Signed: Edsel Petrin. Dalbert Batman, M.D., FACS General and minimally invasive surgery Breast and Colorectal Surgery  07/26/2013, 6:25 AM

## 2013-07-27 ENCOUNTER — Telehealth: Payer: Self-pay | Admitting: *Deleted

## 2013-07-27 NOTE — Telephone Encounter (Signed)
Patient called back and confirmed appointment with [name].

## 2013-07-27 NOTE — Telephone Encounter (Signed)
Left voice message for patient to please return call for appointment.  Contact name and number provided.

## 2013-08-01 ENCOUNTER — Ambulatory Visit: Payer: BC Managed Care – PPO | Admitting: Cardiology

## 2013-08-04 ENCOUNTER — Ambulatory Visit (INDEPENDENT_AMBULATORY_CARE_PROVIDER_SITE_OTHER): Payer: BC Managed Care – PPO | Admitting: Cardiology

## 2013-08-04 ENCOUNTER — Encounter: Payer: Self-pay | Admitting: Cardiology

## 2013-08-04 ENCOUNTER — Encounter: Payer: BC Managed Care – PPO | Admitting: Cardiology

## 2013-08-04 VITALS — BP 128/62 | HR 81 | Ht 72.0 in | Wt 180.0 lb

## 2013-08-04 DIAGNOSIS — R011 Cardiac murmur, unspecified: Secondary | ICD-10-CM

## 2013-08-04 DIAGNOSIS — Z87898 Personal history of other specified conditions: Secondary | ICD-10-CM

## 2013-08-04 NOTE — Patient Instructions (Signed)
Your physician recommends that you schedule a follow-up appointment in: as needed  

## 2013-08-04 NOTE — Assessment & Plan Note (Signed)
No significant valvular abnormalities by echocardiogram.

## 2013-08-04 NOTE — Progress Notes (Signed)
    Clinical Summary Timothy Hall is a 58 y.o.male last seen in November 2014 for a preoperative evaluation. In the interim he underwent resection of a giant cystic gastrointestinal stromal tumor by [name]. Postoperative course unremarkable from a cardiac perspective. You'll be having an oncology evaluation later this month to see if he requires any additional treatments.  ECG from early January showed normal sinus rhythm. He denies any chest pain symptoms or breathlessness in the postoperative setting. Has not had any difficulty eating his usual foods. He has lost around 15 pounds through this whole process.  Preoperative echocardiogram from November 2014 showed normal LVEF of 55-60% without regional wall motion abnormalities, normal diastolic function, no significant valvular abnormalities.   Allergies  Allergen Reactions  . Nicoderm [Nicotine] Rash    Current Outpatient Prescriptions  Medication Sig Dispense Refill  . aspirin EC 81 MG tablet Take 1 tablet (81 mg total) by mouth daily.  30 tablet  0  . HYDROcodone-acetaminophen (NORCO/VICODIN) 5-325 MG per tablet Take 1-2 tablets by mouth every 4 (four) hours as needed.  30 tablet  0  . meclizine (ANTIVERT) 25 MG tablet Take 25 mg by mouth 3 (three) times daily.      . Misc. Devices (NASAL SPRAY BOTTLE) MISC Place 2 sprays into both nostrils 2 (two) times daily before a meal. dyna mist nasal spray      . omeprazole (PRILOSEC) 20 MG capsule Take 20 mg by mouth daily.        No current facility-administered medications for this visit.    Past Medical History  Diagnosis Date  . GERD (gastroesophageal reflux disease)   . Vertigo   . Sleep apnea     Intolerant of CPAP  . History of alcoholism     Quit drinking in 1985  . Gastric neoplasm     Cystic, 18 cm - recently diagnosed October 2014   . Pneumonia age 39    hx of  . Heart murmur age 59    Social History Timothy Hall reports that he has been smoking Cigarettes.  He has a 60  pack-year smoking history. He has never used smokeless tobacco. Timothy Hall reports that he does not drink alcohol.  Review of Systems No palpitations, chest pain, versus shortness of breath, syncope.  Physical Examination Filed Vitals:   08/04/13 1034  BP: 128/62  Pulse: 81   Filed Weights   08/04/13 1034  Weight: 180 lb (81.647 kg)    Patient appears comfortable at rest.  HEENT: Conjunctiva and lids normal, oropharynx clear.  Neck: Supple, no elevated JVP or carotid bruits, no thyromegaly.  Lungs: Clear to auscultation, nonlabored breathing at rest.  Cardiac: Regular rate and rhythm, no S3, soft basal systolic murmur, no pericardial rub.  Abdomen: Soft, well-healing vertical abdominal incision with staples in place.  Extremities: No pitting edema, distal pulses 2+.    Problem List and Plan   History of chest pain This has resolved following his recent resection of a giant cystic gastrointestinal stromal tumor. Perioperative ECG was normal, and his preoperative echocardiogram showed normal LVEF. No further cardiac testing is planned at this time. Followup can be as needed. Keep regular visits with Dr. Edrick Oh.  Heart murmur  No significant valvular abnormalities by echocardiogram.    Satira Sark, M.D., F.A.C.C.

## 2013-08-04 NOTE — Assessment & Plan Note (Signed)
This has resolved following his recent resection of a giant cystic gastrointestinal stromal tumor. Perioperative ECG was normal, and his preoperative echocardiogram showed normal LVEF. No further cardiac testing is planned at this time. Followup can be as needed. Keep regular visits with Dr. Edrick Oh.

## 2013-08-04 NOTE — Progress Notes (Signed)
SError This encounter was created in error - please disregard.

## 2013-08-05 ENCOUNTER — Encounter (INDEPENDENT_AMBULATORY_CARE_PROVIDER_SITE_OTHER): Payer: Self-pay | Admitting: General Surgery

## 2013-08-05 ENCOUNTER — Ambulatory Visit (INDEPENDENT_AMBULATORY_CARE_PROVIDER_SITE_OTHER): Payer: BC Managed Care – PPO | Admitting: General Surgery

## 2013-08-05 VITALS — BP 122/78 | HR 60 | Temp 97.7°F | Resp 16 | Ht 72.0 in | Wt 181.2 lb

## 2013-08-05 DIAGNOSIS — D490 Neoplasm of unspecified behavior of digestive system: Secondary | ICD-10-CM

## 2013-08-05 NOTE — Patient Instructions (Signed)
You are recovering from your abdominal surgery and gastric tumor removal without any obvious complications.  You have been given a copy of your pathology report , and we discussed this.  The wound is healing normally and we removed the staples today.  You may  remove the Steri-Strips in 2 weeks if they do not fall off.  No sports or lifting more than 20 pounds until February 14  Keep your appointment with Timothy Hall on January 29  Return to see Timothy Hall in 6 weeks.

## 2013-08-05 NOTE — Progress Notes (Signed)
Patient ID: Timothy Hall, male   DOB: 08-05-1955, 58 y.o.   MRN: [id number] History: This gentleman underwent exploratory laparotomy and resection of a giant cystic neoplasm of the posterior wall of the stomach. We resected part of the posterior wall of the stomach.Pathology report showed gastrointestinal stromal tumor, 19 cm. 12% risk of progression. Low mitotic rate. Huge size. I gave him a copy of the pathology report. Case has been presented in GI tumor board. Patient is going to Dr. [name] on January 29 for medical oncology consultation. The patient is doing well. Normal appetite. Normal bowel movements. No wound problems. Minimal pain.  Exam:   patient was well. Good spirits. Abdomen soft and nontender. Midline wound healing normally. Staples removed and Steri-Strips applied  Impression: 19 cm cystic neoplasm of posterior wall stomach, gastrointestinal stromal tumor by histology. Recovering uneventfully following exploratory laparotomy and resection  Plan: Diet & activities discussed No sports or heavy lifting until February 14. Normal activities thereafter Keep appointment with [name] January 29 Return to see me in 6 weeks.   Edsel Petrin. Dalbert Batman, M.D., [facility] General and Minimally invasive Surgery Breast and Colorectal Surgery Office:   [phone] Pager:   [phone]

## 2013-08-18 ENCOUNTER — Telehealth: Payer: Self-pay | Admitting: Oncology

## 2013-08-18 ENCOUNTER — Ambulatory Visit (HOSPITAL_BASED_OUTPATIENT_CLINIC_OR_DEPARTMENT_OTHER): Payer: BC Managed Care – PPO | Admitting: Oncology

## 2013-08-18 ENCOUNTER — Encounter: Payer: Self-pay | Admitting: Oncology

## 2013-08-18 ENCOUNTER — Ambulatory Visit (HOSPITAL_BASED_OUTPATIENT_CLINIC_OR_DEPARTMENT_OTHER): Payer: BC Managed Care – PPO

## 2013-08-18 VITALS — BP 151/88 | HR 80 | Temp 97.8°F | Resp 18 | Ht 72.0 in | Wt 182.2 lb

## 2013-08-18 DIAGNOSIS — C49A2 Gastrointestinal stromal tumor of stomach: Secondary | ICD-10-CM

## 2013-08-18 DIAGNOSIS — C49A4 Gastrointestinal stromal tumor of large intestine: Secondary | ICD-10-CM

## 2013-08-18 DIAGNOSIS — C49A0 Gastrointestinal stromal tumor, unspecified site: Secondary | ICD-10-CM

## 2013-08-18 DIAGNOSIS — D481 Neoplasm of uncertain behavior of connective and other soft tissue: Secondary | ICD-10-CM

## 2013-08-18 NOTE — Progress Notes (Signed)
Abdominal incision looks well healed-some steri strips still in place. Denies any pain or GI complaints. Anxious to get back to his job at [employer]. His wife is present at appointment today. She adds that for all his follow up endoscopy procedures they are going to return to Dr. Buford Dresser in [place].

## 2013-08-18 NOTE — Telephone Encounter (Signed)
gv and printed appt sched and avs for pt for Feb...alleta did not have lat appointment on 2.3.15.Marland KitchenMarland Kitchenpt wanted 5pm..done,...per Manuela Schwartz ok to move lisa appt to 2.24.Marland KitchenMarland Kitchen

## 2013-08-18 NOTE — Progress Notes (Signed)
[facility] New Patient Consult   Referring MD: Sun Wilensky 58 y.o.  06-06-1956    Reason for Referral: Gastrointestinal stromal tumor of the stomach     HPI: He developed acute dizziness and neck/shoulder pain and presented to the [facility] emergency room. A CT of the chest on 05/13/2013 revealed no pulmonary embolism or aortic dissection. The liver and spleen appeared normal. A large mass was noted in the upper abdomen that was intimately associated with the inferior gastric fundus. The mass had peripheral enhancement and was predominantly cystic. The pancreas appeared normal. No obstruction of the stomach.  He was referred to [name]. MRI of the abdomen on 06/06/2013 confirmed a complex cystic lesion anterior abdominal mesentery with enhancement. The lesion was noted to abut the pancreas and the inferior aspect of the greater curvature of the stomach. The liver appeared normal. The spleen, pancreas, and adrenal glands appeared normal. No suspicious lymphadenopathy.  He reported a 2-1/2 year history of progressive abdominal distention. He related this to be coming "fat ".   Timothy Hall saw Dr. [name] and was taken to an endoscopic ultrasound on 05/25/2013. A large mass was noted to abut the inferior margin of the stomach, but did not appear to involve the stomach. The lesion had solid components, but was predominantly cystic.  He was taken the operating room by [name] on 07/19/2013 and underwent a partial gastrectomy and resection of a retrogastric cystic neoplasm. The mass filled the lesser omentum. The cystic neoplasm was felt to invade the posterior wall of the stomach and resection of the gastric wall was performed. No other evidence of neoplasia. The cyst was not ruptured.  The pathology [id number]) revealed a gastrointestinal stromal tumor, epithelioid type, 19 cm. The mitotic rate was less than 5 per 50 high-powered fields. The margins  were negative. The tumor did not involve the gastric mucosa. The tumor cells were positive for CD117 and CD34.  Timothy Hall has recovered from surgery. He is referred to consider adjuvant therapy.     Past Medical History  Diagnosis Date  . GERD (gastroesophageal reflux disease)   . Vertigo   . Sleep apnea     Intolerant of CPAP  . History of alcoholism     Quit drinking in 1985  . Gastric neoplasm-gastrointestinal stromal tumor      Cystic, 18 cm - recently diagnosed October 2014   . Pneumonia age 34    hx of  . Heart murmur age 26    Past Surgical History  Procedure Laterality Date  . Tumor removal      Benign tumor removed from right breast nd back area.   . Eus N/A 05/25/2013    Procedure: ESOPHAGEAL ENDOSCOPIC ULTRASOUND (EUS) RADIAL;  Surgeon: [name], MD;  Location: [facility] ENDOSCOPY;  Service: Endoscopy;  Laterality: N/A;  . Fine needle aspiration N/A 05/25/2013    Procedure: FINE NEEDLE ASPIRATION (FNA) LINEAR;  Surgeon: [name], MD;  Location: [facility] ENDOSCOPY;  Service: Endoscopy;  Laterality: N/A;"this part never happend"  . Laparoscopic gastrectomy N/A 07/19/2013    Procedure: RESECTION OF RETROGASTRIC CYSTIC NEOPLASM WITH PARTIAL GASTRECTOMY;  Surgeon: [name], MD;  Location: [facility] ORS;  Service: General;  Laterality: N/A;    Family History  Problem Relation Age of Onset  . Colon cancer Neg Hx   . Stomach cancer Neg Hx   . Breast cancer Mother   . CAD Brother     Premature  disease  2 sisters, one brother, no other family history of cancer   Current outpatient prescriptions:meclizine (ANTIVERT) 25 MG tablet, Take 25 mg by mouth 3 (three) times daily., Disp: , Rfl: ;  omeprazole (PRILOSEC) 20 MG capsule, Take 20 mg by mouth daily. , Disp: , Rfl: ;  aspirin EC 81 MG tablet, Take 1 tablet (81 mg total) by mouth daily., Disp: 30 tablet, Rfl: 0  Allergies:  Allergies  Allergen Reactions  . Nicoderm [Nicotine] Rash    Social History:  He lives with his  wife in [place]. He works in a Agricultural engineer. He has a history of cigarette use, currently smoking one half pack per day. Remote history of heavy alcohol use-none at present. No transfusion history. No risk factor for HIV or hepatitis.    History  Alcohol Use No    Comment: quit in 1985    History  Smoking status  . Current Every Day Smoker -- 1.50 packs/day for 40 years  . Types: Cigarettes  Smokeless tobacco  . Never Used    Comment: Currently smoking one half pack of cigarettes per day      ROS:   Positives include: gradual abdominal distention for the past 2-1/2 years, "dizziness "relieved with eating "Oreo "cookies for one to 2 weeks prior to surgery, rash after using a nicotine patch  A complete ROS was otherwise negative.  Physical Exam:  Blood pressure 151/88, pulse 80, temperature 97.8 F (36.6 C), temperature source Oral, resp. rate 18, height 6' (1.829 m), weight 182 lb 3.2 oz (82.645 kg).  HEENT:  Oropharynx without visible mass, neck without mass Lungs:  Clear bilaterally, distant breath sound Cardiac:  Regular rate and rhythm Abdomen:  Healing surgical incision with Steri-Strips in place, no hepatosplenomegaly, nontender, no mass GU:  Testes without mass  Vascular:  No leg edema Lymph nodes:  No cervical, supraclavicular, axillary, or inguinal nodes Neurologic:  Alert and oriented, the motor exam appears intact in the upper and lower extremities Skin:  Multiple tattoos, no rash Musculoskeletal:  No spine tenderness   LAB:  CBC  Lab Results  Component Value Date   WBC 10.7* 07/23/2013   HGB 10.4* 07/23/2013   HCT 31.7* 07/23/2013   MCV 91.9 07/23/2013   PLT 263 07/23/2013   NEUTROABS 5.1 07/11/2013     CMP      Component Value Date/Time   NA 139 07/23/2013 0533   K 4.0 07/23/2013 0533   CL 100 07/23/2013 0533   CO2 24 07/23/2013 0533   GLUCOSE 98 07/23/2013 0533   BUN 27* 07/23/2013 0533   CREATININE 0.84 07/23/2013 0533   CALCIUM 9.2 07/23/2013 0533   PROT  7.5 07/11/2013 1100   ALBUMIN 4.0 07/11/2013 1100   AST 32 07/11/2013 1100   ALT 56* 07/11/2013 1100   ALKPHOS 95 07/11/2013 1100   BILITOT 0.3 07/11/2013 1100   GFRNONAA >90 07/23/2013 0533   GFRAA >90 07/23/2013 0533     Radiology: as per history of present illness    Assessment/Plan:   1. Gastrointestinal stromal tumor, stage II (T4 NX), most likely arising from the stomach, status post surgical resection including a partial gastrectomy on 07/19/2013, 19 cm cystic mass with a low mitotic rate   Disposition:   Timothy Hall has been diagnosed with a gastrointestinal stromal tumor. I discussed the diagnosis, prognosis, and adjuvant treatment options with Timothy Hall and his wife. We reviewed the details of the surgical pathology report. We discussed the prognostic features  associated with his tumor including the location, size, and mitotic rate. He has a good prognosis for a long-term disease-free survival. I reviewed several recurrence prediction tables and the estimated recurrence rate is in the 12-15% range. This is assuming a gastric origin for this tumor.  I reviewed the data confirming a disease-free and overall survival benefit for adjuvant Gleevec therapy in patients with high-risk resected gastrointestinal stromal tumors. We discussed the potential toxicities associated with Gleevec including the chance for nausea, diarrhea, rash, cardiac toxicity, edema, and hematologic toxicity.  He would like to proceed with adjuvant Gleevec therapy. Timothy Hall will attend a chemotherapy teaching class. The plan is to begin [place] on 08/24/2013. He will return for an office and lab visit on 09/06/2013.  Approximately 50 minutes were spent with the patient today. The majority of the time was used for counseling and coordination of care.  [place], [place] 08/18/2013, 5:34 PM

## 2013-08-18 NOTE — Progress Notes (Signed)
Met with Timothy Hall and family. Explained role of nurse navigator. Educational information provided on GIST.  Referral made to dietician for diet education. [place] resources provided to patient, including SW service information and the smoking cessation program.  Patient declined offer to assist patient with registration for the program.  Contact names and phone numbers were provided for entire [facility] team.  Teach back method was used.  No barriers to care identified.  Will continue to follow as needed.

## 2013-08-19 ENCOUNTER — Other Ambulatory Visit: Payer: Self-pay | Admitting: *Deleted

## 2013-08-19 ENCOUNTER — Encounter: Payer: Self-pay | Admitting: Oncology

## 2013-08-19 DIAGNOSIS — D481 Neoplasm of uncertain behavior of connective and other soft tissue: Secondary | ICD-10-CM

## 2013-08-19 MED ORDER — IMATINIB MESYLATE 400 MG PO TABS: 400.0000 mg | ORAL_TABLET | Freq: Every day | ORAL | Status: DC

## 2013-08-19 NOTE — Progress Notes (Signed)
Faxed gleevec prescription to [facility] °

## 2013-08-19 NOTE — Progress Notes (Signed)
RECEIVED A FAX FROM [facility] CONCERNING A CONFIRMATION OF FACSIMILE RECEIPT FOR PT. REFERRAL. 

## 2013-08-23 ENCOUNTER — Telehealth: Payer: Self-pay | Admitting: *Deleted

## 2013-08-23 NOTE — Telephone Encounter (Signed)
Message from Maudie Mercury with [facility] reporting [facility] has been transferred to [facility] [phone]) due to insurance coverage. Called pharmacy to confirm receipt of Rx. They have Rx for Gleevec 400 mg daily with 1 refill.

## 2013-08-24 ENCOUNTER — Encounter: Payer: Self-pay | Admitting: *Deleted

## 2013-08-30 ENCOUNTER — Encounter (INDEPENDENT_AMBULATORY_CARE_PROVIDER_SITE_OTHER): Payer: Self-pay

## 2013-08-30 ENCOUNTER — Telehealth (INDEPENDENT_AMBULATORY_CARE_PROVIDER_SITE_OTHER): Payer: Self-pay

## 2013-08-30 ENCOUNTER — Encounter: Payer: Self-pay | Admitting: *Deleted

## 2013-08-30 ENCOUNTER — Other Ambulatory Visit: Payer: BC Managed Care – PPO

## 2013-08-30 ENCOUNTER — Telehealth: Payer: Self-pay | Admitting: *Deleted

## 2013-08-30 NOTE — Telephone Encounter (Signed)
Spoke with patient re: appointments.  His schedule was reviewed.  He is coming for the chemo class tonight.  He was told by drug company that his [place] is being delivered today, for start date tomorrow.  He understands to contact this office if he does not receive the drug.  All questions were answered.

## 2013-08-30 NOTE — Telephone Encounter (Signed)
Called and spoke with patient's wife [name]) regarding RTW note.  Per [name] patient can return to work without restrictions.  RTW note will be at the front desk for patient to pick up.

## 2013-09-07 ENCOUNTER — Telehealth: Payer: Self-pay | Admitting: *Deleted

## 2013-09-07 DIAGNOSIS — C49A2 Gastrointestinal stromal tumor of stomach: Secondary | ICD-10-CM

## 2013-09-07 NOTE — Telephone Encounter (Signed)
Received phone call from patient's wife re: insurance and cancer policy.  She stated she spoke with the financial counselor and was given a number to call.  She has not called that number for billing.  This RN advised her to call the number and see if that is helpful.  This RN asked that she bring all paperwork in to next week's visit and that a SW referral will be made for assistance.  She was appreciative of the information and stated she would contact this RN if needed for further assist,.

## 2013-09-13 ENCOUNTER — Ambulatory Visit: Payer: BC Managed Care – PPO | Admitting: Nutrition

## 2013-09-13 ENCOUNTER — Ambulatory Visit (INDEPENDENT_AMBULATORY_CARE_PROVIDER_SITE_OTHER): Payer: BC Managed Care – PPO | Admitting: General Surgery

## 2013-09-13 ENCOUNTER — Other Ambulatory Visit (HOSPITAL_BASED_OUTPATIENT_CLINIC_OR_DEPARTMENT_OTHER): Payer: BC Managed Care – PPO

## 2013-09-13 ENCOUNTER — Ambulatory Visit (HOSPITAL_BASED_OUTPATIENT_CLINIC_OR_DEPARTMENT_OTHER): Payer: BC Managed Care – PPO | Admitting: Nurse Practitioner

## 2013-09-13 ENCOUNTER — Encounter: Payer: Self-pay | Admitting: Oncology

## 2013-09-13 ENCOUNTER — Telehealth: Payer: Self-pay | Admitting: Oncology

## 2013-09-13 ENCOUNTER — Encounter (INDEPENDENT_AMBULATORY_CARE_PROVIDER_SITE_OTHER): Payer: Self-pay | Admitting: General Surgery

## 2013-09-13 ENCOUNTER — Encounter: Payer: Self-pay | Admitting: *Deleted

## 2013-09-13 VITALS — BP 133/79 | HR 69 | Temp 97.1°F | Resp 18 | Ht 72.0 in | Wt 190.5 lb

## 2013-09-13 VITALS — BP 124/80 | HR 68 | Temp 98.6°F | Resp 14 | Ht 72.0 in | Wt 189.6 lb

## 2013-09-13 DIAGNOSIS — C49A2 Gastrointestinal stromal tumor of stomach: Secondary | ICD-10-CM

## 2013-09-13 DIAGNOSIS — D481 Neoplasm of uncertain behavior of connective and other soft tissue: Secondary | ICD-10-CM

## 2013-09-13 DIAGNOSIS — C49A0 Gastrointestinal stromal tumor, unspecified site: Secondary | ICD-10-CM

## 2013-09-13 LAB — CBC WITH DIFFERENTIAL/PLATELET
BASO%: 0.9 % (ref 0.0–2.0)
Basophils Absolute: 0.1 10*3/uL (ref 0.0–0.1)
EOS%: 4.3 % (ref 0.0–7.0)
Eosinophils Absolute: 0.3 10*3/uL (ref 0.0–0.5)
HCT: 39.9 % (ref 38.4–49.9)
HGB: 12.9 g/dL — ABNORMAL LOW (ref 13.0–17.1)
LYMPH%: 32.3 % (ref 14.0–49.0)
MCH: 28.5 pg (ref 27.2–33.4)
MCHC: 32.2 g/dL (ref 32.0–36.0)
MCV: 88.5 fL (ref 79.3–98.0)
MONO#: 0.5 10*3/uL (ref 0.1–0.9)
MONO%: 6.8 % (ref 0.0–14.0)
NEUT#: 4 10*3/uL (ref 1.5–6.5)
NEUT%: 55.7 % (ref 39.0–75.0)
Platelets: 335 10*3/uL (ref 140–400)
RBC: 4.51 10*6/uL (ref 4.20–5.82)
RDW: 13.8 % (ref 11.0–14.6)
WBC: 7.1 10*3/uL (ref 4.0–10.3)
lymph#: 2.3 10*3/uL (ref 0.9–3.3)

## 2013-09-13 LAB — COMPREHENSIVE METABOLIC PANEL (CC13)
ALK PHOS: 100 U/L (ref 40–150)
ALT: 49 U/L (ref 0–55)
AST: 29 U/L (ref 5–34)
Albumin: 3.9 g/dL (ref 3.5–5.0)
Anion Gap: 9 mEq/L (ref 3–11)
BILIRUBIN TOTAL: 0.31 mg/dL (ref 0.20–1.20)
BUN: 16.7 mg/dL (ref 7.0–26.0)
CO2: 25 mEq/L (ref 22–29)
Calcium: 9.3 mg/dL (ref 8.4–10.4)
Chloride: 108 mEq/L (ref 98–109)
Creatinine: 1.1 mg/dL (ref 0.7–1.3)
Glucose: 92 mg/dl (ref 70–140)
POTASSIUM: 4.4 meq/L (ref 3.5–5.1)
SODIUM: 142 meq/L (ref 136–145)
TOTAL PROTEIN: 7.2 g/dL (ref 6.4–8.3)

## 2013-09-13 NOTE — Progress Notes (Signed)
[place] Work  Holiday representative received referral from patient navigator for financial questions and concerns.  CSW met with pt and pt's wife in exam room to assess for psychosocial needs and offer support.  Pt's wife expressed feeling overwhelmed with bills and paperwork.  CSW and pt's wife reviewed paper work she brought with her today.  Paper work included Information systems manager for pt's wife's and Patent attorney.  CSW informed pt the the paper work would be given to the appropriate people to be completed.  CSW provided paper work to [place] who will complete and submit as needed.  CSW informed pt and pt's wife and encouraged them to call with any additional questions or concerns.    Timothy Hall, MSW, [place] Worker [facility] [phone]

## 2013-09-13 NOTE — Patient Instructions (Signed)
Your incision has healed very well. You may return to normal activities without restriction. Try not  to lift anything that is excessively heavy, however  I agree with the [place] that you're taking.  I recommend that you see Dr. Gala Romney in December and get another upper endoscopy.   We probably should get a CT scan of the abdomen and pelvis then  Return to see [name] in January 2016.

## 2013-09-13 NOTE — Progress Notes (Addendum)
OFFICE PROGRESS NOTE  Interval history:  Timothy Hall returns for a followup of gastrointestinal stromal tumor most likely arising from the stomach. He began adjuvant Gleevec on 08/31/2013. He denies nausea/vomiting. He has had a few loose stools. He denies leg swelling. His wife notes that his face is "puffy". He denies shortness of breath. No skin rash. His wife notes that his skin is dry.   Objective: Filed Vitals:   09/13/13 1319  BP: 133/79  Pulse: 69  Temp: 97.1 F (36.2 C)  Resp: 18   Mild periorbital edema. Oropharynx without thrush or ulceration. Lungs clear. Regular cardiac rhythm. Abdomen soft and nontender. Well-healed midline incision. No organomegaly. No leg edema. Faint fine erythematous rash scattered over the upper and mid back and upper arms.   Lab Results: Lab Results  Component Value Date   WBC 7.1 09/13/2013   HGB 12.9* 09/13/2013   HCT 39.9 09/13/2013   MCV 88.5 09/13/2013   PLT 335 09/13/2013   NEUTROABS 4.0 09/13/2013    Chemistry:    Chemistry      Component Value Date/Time   NA 142 09/13/2013 1249   NA 139 07/23/2013 0533   K 4.4 09/13/2013 1249   K 4.0 07/23/2013 0533   CL 100 07/23/2013 0533   CO2 25 09/13/2013 1249   CO2 24 07/23/2013 0533   BUN 16.7 09/13/2013 1249   BUN 27* 07/23/2013 0533   CREATININE 1.1 09/13/2013 1249   CREATININE 0.84 07/23/2013 0533      Component Value Date/Time   CALCIUM 9.3 09/13/2013 1249   CALCIUM 9.2 07/23/2013 0533   ALKPHOS 100 09/13/2013 1249   ALKPHOS 95 07/11/2013 1100   AST 29 09/13/2013 1249   AST 32 07/11/2013 1100   ALT 49 09/13/2013 1249   ALT 56* 07/11/2013 1100   BILITOT 0.31 09/13/2013 1249   BILITOT 0.3 07/11/2013 1100       Studies/Results: No results found.  Medications: I have reviewed the patient's current medications.  Assessment/Plan: 1. Gastrointestinal stromal tumor stage II (T4 NX), most likely arising from the stomach, status post surgical resection including a partial gastrectomy on 07/19/2013, 19 cm  cystic mass with a low mitotic rate.   Initiation of adjuvant Gleevec 08/31/2013. 2. Skin rash most likely secondary to [place]. 3. Mild periorbital edema secondary to [place].  Dispositon-he appears stable. He continues [place]. He will return for labs and a followup visit in 4 weeks. He will contact the office in the interim with any problems.  Patient seen with [name].   [name] ANP/GNP-BC    This was a shared visit with [name]. Timothy Hall appears to be tolerating the [place] well. Timothy Hall was interviewed and examined.  [name], M.D.

## 2013-09-13 NOTE — Progress Notes (Signed)
Patient ID: Timothy Hall, male   DOB: 18-May-1956, 58 y.o.   MRN: [id number]  History:  This gentleman underwent exploratory laparotomy and resection of a giant cystic neoplasm of the posterior wall of the stomach 07/19/2013.  We resected part of the posterior wall of the stomach.Pathology report showed gastrointestinal stromal tumor, 19 cm. 12% risk of progression. Low mitotic rate. Huge size.  I gave him a copy of the pathology report.  Case has been presented in GI tumor board.  He saw [name] and has been started on oral Gleevec. The patient is doing well. Normal appetite. Normal bowel movements. No wound problems. Minimal pain.   Exam:  patient is well. Good spirits.  Abdomen soft and nontender. Midline wound healing normally.    Impression:  19 cm cystic neoplasm of posterior wall stomach, gastrointestinal stromal tumor by histology.  Recovering uneventfully following exploratory laparotomy and resection   Plan:  Diet & activities discussed. Return to normal duties. No restriction. Try to avoid lifting ridiculously heavy things such as engine blocks(his hobby)  Oral Gleevic  Continue close clinical followup with Dr. [name]. Advised followup upper endoscopy by Dr. Gala Romney in December Advised CT scan abdomen and pelvis in December See me in January, 2016.[name]     Edsel Petrin. Dalbert Batman, M.D., [facility]  General and Minimally invasive Surgery  Breast and Colorectal Surgery  Office: [phone]  Pager: [phone]

## 2013-09-13 NOTE — Telephone Encounter (Signed)
Gave pt appt for lab and MD for march 2015 °

## 2013-09-13 NOTE — Progress Notes (Signed)
Put wife's fmla form and cancer policy on nurse's desk.

## 2013-09-13 NOTE — Progress Notes (Signed)
58 year old male diagnosed with GIST status post partial gastrectomy on December 30.  Past medical history includes GERD, sleep apnea, alcohol intake, pneumonia, and tobacco.  Medications include oral Gleevec and Prilosec.  Labs include BUN 27 on January 3.  Height: 6 feet 0 inches. Weight: 189.6 pounds February 24. Usual body weight: 195 pounds December 2014. BMI: 25.71.  Patient is healing well and has been permitted to return to normal activities.  Patient has been taking Gleevec for 2 weeks and denies nutrition side effects.  Per patient's wife, patient is not a very healthy eater.  She hopes that I can convince him to improve his diet.  Patient verbalizes that he will continue to eat what he wants to eat.  Nutrition diagnosis: Food and nutrition related knowledge deficit related to diagnosis of GIST as evidenced by no prior need for nutrition related information.  Intervention: Patient educated on strategies for eating if he does develop nausea or diarrhea.  I have encouraged patient to improve his diet in small amounts over time.  We've discussed making small changes gradually, and I gave him specific examples of ways to improve intake of healthy, plant-based foods.  Fact sheets were provided.  Teach back method used.  Patient verbalizes that he will consider what was discussed.  Monitoring, evaluation, goals: Patient will tolerate oral diet while on chemotherapy for weight maintenance with minimal side effects.  Next visit: No followup has been made.  Patient can call me if he develops questions or concerns.

## 2013-09-16 ENCOUNTER — Encounter: Payer: Self-pay | Admitting: Oncology

## 2013-09-16 NOTE — Progress Notes (Signed)
Faxed cancer form to [facility] @ [phone], put wife's fmla form and [employer] form in registration desk.

## 2013-09-30 ENCOUNTER — Telehealth: Payer: Self-pay | Admitting: Oncology

## 2013-09-30 NOTE — Telephone Encounter (Signed)
Faxed pt medical records to Dr. [name] °

## 2013-10-12 ENCOUNTER — Other Ambulatory Visit (HOSPITAL_BASED_OUTPATIENT_CLINIC_OR_DEPARTMENT_OTHER): Payer: BC Managed Care – PPO

## 2013-10-12 ENCOUNTER — Telehealth: Payer: Self-pay | Admitting: *Deleted

## 2013-10-12 DIAGNOSIS — C49A2 Gastrointestinal stromal tumor of stomach: Secondary | ICD-10-CM

## 2013-10-12 DIAGNOSIS — D481 Neoplasm of uncertain behavior of connective and other soft tissue: Secondary | ICD-10-CM

## 2013-10-12 LAB — CBC WITH DIFFERENTIAL/PLATELET
BASO%: 1 % (ref 0.0–2.0)
Basophils Absolute: 0 10*3/uL (ref 0.0–0.1)
EOS%: 8.8 % — AB (ref 0.0–7.0)
Eosinophils Absolute: 0.2 10*3/uL (ref 0.0–0.5)
HEMATOCRIT: 39.8 % (ref 38.4–49.9)
HGB: 12.9 g/dL — ABNORMAL LOW (ref 13.0–17.1)
LYMPH%: 57.9 % — ABNORMAL HIGH (ref 14.0–49.0)
MCH: 28.8 pg (ref 27.2–33.4)
MCHC: 32.5 g/dL (ref 32.0–36.0)
MCV: 88.6 fL (ref 79.3–98.0)
MONO#: 0.7 10*3/uL (ref 0.1–0.9)
MONO%: 25.2 % — ABNORMAL HIGH (ref 0.0–14.0)
NEUT#: 0.2 10*3/uL — CL (ref 1.5–6.5)
NEUT%: 7.1 % — AB (ref 39.0–75.0)
Platelets: 293 10*3/uL (ref 140–400)
RBC: 4.49 10*6/uL (ref 4.20–5.82)
RDW: 15.4 % — ABNORMAL HIGH (ref 11.0–14.6)
WBC: 2.6 10*3/uL — ABNORMAL LOW (ref 4.0–10.3)
lymph#: 1.5 10*3/uL (ref 0.9–3.3)

## 2013-10-12 LAB — COMPREHENSIVE METABOLIC PANEL (CC13)
ALT: 26 U/L (ref 0–55)
ANION GAP: 10 meq/L (ref 3–11)
AST: 24 U/L (ref 5–34)
Albumin: 3.8 g/dL (ref 3.5–5.0)
Alkaline Phosphatase: 103 U/L (ref 40–150)
BUN: 15.4 mg/dL (ref 7.0–26.0)
CO2: 22 meq/L (ref 22–29)
Calcium: 9 mg/dL (ref 8.4–10.4)
Chloride: 109 mEq/L (ref 98–109)
Creatinine: 1.1 mg/dL (ref 0.7–1.3)
Glucose: 123 mg/dl (ref 70–140)
Potassium: 3.7 mEq/L (ref 3.5–5.1)
Sodium: 142 mEq/L (ref 136–145)
Total Bilirubin: 0.27 mg/dL (ref 0.20–1.20)
Total Protein: 7 g/dL (ref 6.4–8.3)

## 2013-10-12 NOTE — Telephone Encounter (Signed)
Labs today: ANC 0.2 Per [name] : Hold Gleevec and call for fever. Repeat labs tomorrow. Was able to reach patient on his home # and gave above instructions. He will come in at 12:00 for lab. He does not know why his labs were done today-said "that's what they gave me".

## 2013-10-12 NOTE — Telephone Encounter (Signed)
Now remembers why his labs were done today-his wife can't get off work in time to get here with him at 12:00 for lab. Will have CBC done after visit tomorrow and wait in lobby for results.

## 2013-10-13 ENCOUNTER — Other Ambulatory Visit: Payer: Self-pay | Admitting: *Deleted

## 2013-10-13 ENCOUNTER — Telehealth: Payer: Self-pay | Admitting: Oncology

## 2013-10-13 ENCOUNTER — Other Ambulatory Visit: Payer: BC Managed Care – PPO

## 2013-10-13 ENCOUNTER — Ambulatory Visit (HOSPITAL_BASED_OUTPATIENT_CLINIC_OR_DEPARTMENT_OTHER): Payer: BC Managed Care – PPO | Admitting: Oncology

## 2013-10-13 VITALS — BP 134/68 | HR 81 | Temp 98.1°F | Resp 18 | Ht 72.0 in | Wt 191.6 lb

## 2013-10-13 DIAGNOSIS — D490 Neoplasm of unspecified behavior of digestive system: Secondary | ICD-10-CM

## 2013-10-13 MED ORDER — CIPROFLOXACIN HCL 500 MG PO TABS: 500.0000 mg | ORAL_TABLET | Freq: Two times a day (BID) | ORAL | Status: DC

## 2013-10-13 NOTE — Telephone Encounter (Signed)
per pof sch labs & Dr app for 4/28 @3 :15 lab & 3:45 appt per Dr Sherrill-printesd sch for pt

## 2013-10-13 NOTE — Progress Notes (Signed)
  [place] OFFICE PROGRESS NOTE   Diagnosis:   INTERVAL HISTORY:   Mr. Stiff returns as scheduled. He reports stable. Oral edema. No diarrhea. No fever. No other complaint. He continues to work.  Objective:  Vital signs in last 24 hours:  Blood pressure 134/68, pulse 81, temperature 98.1 F (36.7 C), temperature source Oral, resp. rate 18, height 6' (1.829 m), weight 191 lb 9.6 oz (86.909 kg), SpO2 99.00%.    HEENT: Few 2-3 mm erythematous lesions at the posterior palate, one of the lesions hasn't overlying white exudate. Resp: Lungs clear bilaterally Cardio: Regular rate and rhythm GI: No hepatosplenomegaly, nontender Vascular: No leg edema  Skin: Mild fine rash at the upper back    Lab Results:  Lab Results  Component Value Date   WBC 2.6* 10/12/2013   HGB 12.9* 10/12/2013   HCT 39.8 10/12/2013   MCV 88.6 10/12/2013   PLT 293 10/12/2013   NEUTROABS 0.2* 10/12/2013    Lab Results  Component Value Date   NA 142 10/12/2013    Lab Results  Component Value Date   CEA 0.9 05/31/2013    Imaging:  No results found.  Medications: I have reviewed the patient's current medications.  Assessment/Plan: 1. Gastrointestinal stromal tumor stage II (T4 NX), most likely arising from the stomach, status post surgical resection including a partial gastrectomy on 07/19/2013, 19 cm cystic mass with a low mitotic rate.  Initiation of adjuvant Gleevec 08/31/2013. 2. Mild Skin rash most likely secondary to [place]. 3. Mild periorbital edema secondary to [place]. 4. Severe neutropenia secondary to [facility] 10/12/2013   Disposition:  Mr. Banko has developed severe neutropenia. This is very likely related to [place]. Gleevec was placed on hold 10/12/2013. He knows to contact us for a fever or symptoms of infection. He will begin prophylactic ciprofloxacin and return for a CBC 10/18/2013.  The plan is to begin dose reduced Gleevec when the white count  recovers.  Betsy Coder, MD  10/13/2013  1:10 PM

## 2013-10-13 NOTE — Telephone Encounter (Signed)
THIS REFILL REQUEST FOR GLEEVEC WAS GIVEN TO DR.SHERRILL'S NURSE, TANYA WHITLOCK,RN. 

## 2013-10-18 ENCOUNTER — Other Ambulatory Visit (HOSPITAL_BASED_OUTPATIENT_CLINIC_OR_DEPARTMENT_OTHER): Payer: BC Managed Care – PPO

## 2013-10-18 DIAGNOSIS — D490 Neoplasm of unspecified behavior of digestive system: Secondary | ICD-10-CM

## 2013-10-18 DIAGNOSIS — C49A2 Gastrointestinal stromal tumor of stomach: Secondary | ICD-10-CM

## 2013-10-18 LAB — CBC WITH DIFFERENTIAL/PLATELET
BASO%: 0.6 % (ref 0.0–2.0)
BASOS ABS: 0.1 10*3/uL (ref 0.0–0.1)
EOS%: 3.4 % (ref 0.0–7.0)
Eosinophils Absolute: 0.3 10*3/uL (ref 0.0–0.5)
HCT: 41.2 % (ref 38.4–49.9)
HEMOGLOBIN: 13.4 g/dL (ref 13.0–17.1)
LYMPH%: 33.6 % (ref 14.0–49.0)
MCH: 28.8 pg (ref 27.2–33.4)
MCHC: 32.5 g/dL (ref 32.0–36.0)
MCV: 88.8 fL (ref 79.3–98.0)
MONO#: 0.9 10*3/uL (ref 0.1–0.9)
MONO%: 9.2 % (ref 0.0–14.0)
NEUT#: 4.9 10*3/uL (ref 1.5–6.5)
NEUT%: 53.2 % (ref 39.0–75.0)
Platelets: 325 10*3/uL (ref 140–400)
RBC: 4.64 10*6/uL (ref 4.20–5.82)
RDW: 15.6 % — ABNORMAL HIGH (ref 11.0–14.6)
WBC: 9.3 10*3/uL (ref 4.0–10.3)
lymph#: 3.1 10*3/uL (ref 0.9–3.3)

## 2013-10-19 ENCOUNTER — Telehealth: Payer: Self-pay | Admitting: Oncology

## 2013-10-19 ENCOUNTER — Telehealth: Payer: Self-pay | Admitting: *Deleted

## 2013-10-19 DIAGNOSIS — D481 Neoplasm of uncertain behavior of connective and other soft tissue: Secondary | ICD-10-CM

## 2013-10-19 DIAGNOSIS — C49A2 Gastrointestinal stromal tumor of stomach: Secondary | ICD-10-CM

## 2013-10-19 MED ORDER — IMATINIB MESYLATE 400 MG PO TABS
400.0000 mg | ORAL_TABLET | ORAL | Status: DC
Start: 2013-10-19 — End: 2013-11-14

## 2013-10-19 NOTE — Telephone Encounter (Signed)
Message copied by Brien Few on Wed Oct 19, 2013 10:09 AM ------      Message from: Timothy Hall      Created: Tue Oct 18, 2013  9:17 PM       Please call patient, white count is now normal, d/c Cipro, start gleevec at 400mg  every other day, f/u  Cbc 2 weeks ------

## 2013-10-19 NOTE — Telephone Encounter (Signed)
s.w. pt wife and advised on April 14 lab ok and aware

## 2013-10-19 NOTE — Telephone Encounter (Signed)
Spoke with pt's wife. Instructions given to stop Cipro, Start Gleevec at 400 mg every other day. Mrs. Reith voiced understanding.

## 2013-11-01 ENCOUNTER — Other Ambulatory Visit: Payer: BC Managed Care – PPO

## 2013-11-01 ENCOUNTER — Ambulatory Visit (HOSPITAL_BASED_OUTPATIENT_CLINIC_OR_DEPARTMENT_OTHER): Payer: BC Managed Care – PPO

## 2013-11-01 DIAGNOSIS — D481 Neoplasm of uncertain behavior of connective and other soft tissue: Secondary | ICD-10-CM

## 2013-11-01 LAB — CBC WITH DIFFERENTIAL/PLATELET
BASO%: 0.4 % (ref 0.0–2.0)
BASOS ABS: 0 10*3/uL (ref 0.0–0.1)
EOS%: 3.2 % (ref 0.0–7.0)
Eosinophils Absolute: 0.2 10*3/uL (ref 0.0–0.5)
HEMATOCRIT: 42.3 % (ref 38.4–49.9)
HEMOGLOBIN: 13.8 g/dL (ref 13.0–17.1)
LYMPH%: 28.5 % (ref 14.0–49.0)
MCH: 28.9 pg (ref 27.2–33.4)
MCHC: 32.7 g/dL (ref 32.0–36.0)
MCV: 88.6 fL (ref 79.3–98.0)
MONO#: 0.5 10*3/uL (ref 0.1–0.9)
MONO%: 7 % (ref 0.0–14.0)
NEUT#: 4.8 10*3/uL (ref 1.5–6.5)
NEUT%: 60.9 % (ref 39.0–75.0)
PLATELETS: 337 10*3/uL (ref 140–400)
RBC: 4.78 10*6/uL (ref 4.20–5.82)
RDW: 16.1 % — ABNORMAL HIGH (ref 11.0–14.6)
WBC: 7.8 10*3/uL (ref 4.0–10.3)
lymph#: 2.2 10*3/uL (ref 0.9–3.3)

## 2013-11-02 ENCOUNTER — Telehealth: Payer: Self-pay | Admitting: *Deleted

## 2013-11-02 NOTE — Telephone Encounter (Signed)
Message from pt's wife requesting lab results. Returned call, spoke with pt. CBC results given. Instructed pt to continue same dose of Gleevec. He voiced understanding.

## 2013-11-03 ENCOUNTER — Telehealth: Payer: Self-pay | Admitting: *Deleted

## 2013-11-03 NOTE — Telephone Encounter (Signed)
Received message from call service, pt's wife requests lab values. Returned call, results given. Recommended they request copy of result at next lab visit.

## 2013-11-14 ENCOUNTER — Other Ambulatory Visit (HOSPITAL_BASED_OUTPATIENT_CLINIC_OR_DEPARTMENT_OTHER): Payer: BC Managed Care – PPO

## 2013-11-14 ENCOUNTER — Other Ambulatory Visit: Payer: Self-pay | Admitting: *Deleted

## 2013-11-14 ENCOUNTER — Telehealth: Payer: Self-pay | Admitting: *Deleted

## 2013-11-14 ENCOUNTER — Ambulatory Visit (HOSPITAL_BASED_OUTPATIENT_CLINIC_OR_DEPARTMENT_OTHER): Payer: BC Managed Care – PPO | Admitting: Oncology

## 2013-11-14 ENCOUNTER — Telehealth: Payer: Self-pay | Admitting: Oncology

## 2013-11-14 VITALS — BP 136/84 | HR 74 | Temp 98.7°F | Resp 18 | Ht 72.0 in | Wt 188.5 lb

## 2013-11-14 DIAGNOSIS — D481 Neoplasm of uncertain behavior of connective and other soft tissue: Secondary | ICD-10-CM

## 2013-11-14 DIAGNOSIS — D490 Neoplasm of unspecified behavior of digestive system: Secondary | ICD-10-CM

## 2013-11-14 DIAGNOSIS — C49A2 Gastrointestinal stromal tumor of stomach: Secondary | ICD-10-CM

## 2013-11-14 LAB — CBC WITH DIFFERENTIAL/PLATELET
BASO%: 0.4 % (ref 0.0–2.0)
BASOS ABS: 0 10*3/uL (ref 0.0–0.1)
EOS%: 3.2 % (ref 0.0–7.0)
Eosinophils Absolute: 0.3 10*3/uL (ref 0.0–0.5)
HCT: 41.8 % (ref 38.4–49.9)
HGB: 13.6 g/dL (ref 13.0–17.1)
LYMPH%: 25.9 % (ref 14.0–49.0)
MCH: 28.8 pg (ref 27.2–33.4)
MCHC: 32.6 g/dL (ref 32.0–36.0)
MCV: 88.4 fL (ref 79.3–98.0)
MONO#: 0.6 10*3/uL (ref 0.1–0.9)
MONO%: 5.9 % (ref 0.0–14.0)
NEUT#: 6.5 10*3/uL (ref 1.5–6.5)
NEUT%: 64.6 % (ref 39.0–75.0)
Platelets: 297 10*3/uL (ref 140–400)
RBC: 4.73 10*6/uL (ref 4.20–5.82)
RDW: 16.4 % — ABNORMAL HIGH (ref 11.0–14.6)
WBC: 10.1 10*3/uL (ref 4.0–10.3)
lymph#: 2.6 10*3/uL (ref 0.9–3.3)

## 2013-11-14 MED ORDER — IMATINIB MESYLATE 100 MG PO TABS: 200.0000 mg | ORAL_TABLET | Freq: Every day | ORAL | Status: DC

## 2013-11-14 NOTE — Telephone Encounter (Signed)
Called pt with appt for today. [name] on call 4/28. Pt in agreement, instructed him to come in for lab at 1445. Request to schedulers to change appts.

## 2013-11-14 NOTE — Telephone Encounter (Signed)
Gave pt appt for for lab and MD on May 2015

## 2013-11-14 NOTE — Progress Notes (Signed)
  Timothy Hall OFFICE PROGRESS NOTE   Diagnosis: Gastrointestinal stromal tumor  INTERVAL HISTORY:   [name] returns as scheduled. He resumed Gleevec at a dose of 400 mg every other day 10/18/2013. He reports tolerating the [place] well. No edema, rash, or diarrhea.  Objective:  Vital signs in last 24 hours:  Blood pressure 136/84, pulse 74, temperature 98.7 F (37.1 C), resp. rate 18, height 6' (1.829 m), weight 188 lb 8 oz (85.503 kg), SpO2 99.00%.    HEENT: No thrush or ulcers Resp: Lungs clear bilaterally Cardio: Regular rate and rhythm GI: No hepatosplenomegaly, no mass, no apparent ascites, the abdomen is protuberant Vascular: No leg edema  Skin: Very mild fine rash over the back     Lab Results:  Lab Results  Component Value Date   WBC 10.1 11/14/2013   HGB 13.6 11/14/2013   HCT 41.8 11/14/2013   MCV 88.4 11/14/2013   PLT 297 11/14/2013   NEUTROABS 6.5 11/14/2013    Lab Results  Component Value Date   NA 142 10/12/2013    Lab Results  Component Value Date   CEA 0.9 05/31/2013    Imaging:  No results found.  Medications: I have reviewed the patient's current medications.  Assessment/Plan: 1. Gastrointestinal stromal tumor stage II (T4 NX), most likely arising from the stomach, status post surgical resection including a partial gastrectomy on 07/19/2013, 19 cm cystic mass with a low mitotic rate.  Initiation of adjuvant Gleevec 08/31/2013. Gleevec dose reduced to 400 mg every other day beginning 10/18/2013 secondary to neutropenia 2. Mild Skin rash most likely secondary to [place]. 3. Mild periorbital edema secondary to [place]. 4. Severe neutropenia secondary to [place] 10/12/2013-resolved   Disposition:  He appears stable. He is tolerating Gleevec at the 400 mg every other day dose. He will be switched to 100 mg Gleevec tablets and start a 200 mg daily dose. We will titrate to 300 mg daily as tolerated.  [name] will return for an  office and lab visit in one month.  Timothy Hall, Timothy Hall  11/14/2013  4:16 PM

## 2013-11-15 ENCOUNTER — Other Ambulatory Visit: Payer: BC Managed Care – PPO

## 2013-11-15 ENCOUNTER — Ambulatory Visit: Payer: BC Managed Care – PPO | Admitting: Oncology

## 2013-11-15 ENCOUNTER — Telehealth: Payer: Self-pay | Admitting: *Deleted

## 2013-11-15 NOTE — Telephone Encounter (Signed)
Call from [place] to confirm [place] dose. Confirmed dose decreased to 200 mg daily due to neutropenia on 400 mg dose.

## 2013-12-13 ENCOUNTER — Other Ambulatory Visit (HOSPITAL_BASED_OUTPATIENT_CLINIC_OR_DEPARTMENT_OTHER): Payer: BC Managed Care – PPO

## 2013-12-13 ENCOUNTER — Ambulatory Visit: Payer: Self-pay | Admitting: Oncology

## 2013-12-13 ENCOUNTER — Other Ambulatory Visit: Payer: Self-pay

## 2013-12-13 ENCOUNTER — Ambulatory Visit (HOSPITAL_BASED_OUTPATIENT_CLINIC_OR_DEPARTMENT_OTHER): Payer: BC Managed Care – PPO | Admitting: Nurse Practitioner

## 2013-12-13 ENCOUNTER — Telehealth: Payer: Self-pay | Admitting: Oncology

## 2013-12-13 VITALS — BP 129/61 | HR 84 | Temp 99.1°F | Resp 18 | Ht 72.0 in | Wt 190.2 lb

## 2013-12-13 DIAGNOSIS — C49A2 Gastrointestinal stromal tumor of stomach: Secondary | ICD-10-CM

## 2013-12-13 DIAGNOSIS — R609 Edema, unspecified: Secondary | ICD-10-CM

## 2013-12-13 DIAGNOSIS — D481 Neoplasm of uncertain behavior of connective and other soft tissue: Secondary | ICD-10-CM

## 2013-12-13 DIAGNOSIS — R21 Rash and other nonspecific skin eruption: Secondary | ICD-10-CM

## 2013-12-13 LAB — CBC WITH DIFFERENTIAL/PLATELET
BASO%: 0.2 % (ref 0.0–2.0)
Basophils Absolute: 0 10*3/uL (ref 0.0–0.1)
EOS ABS: 0.3 10*3/uL (ref 0.0–0.5)
EOS%: 3.8 % (ref 0.0–7.0)
HCT: 41 % (ref 38.4–49.9)
HGB: 13.5 g/dL (ref 13.0–17.1)
LYMPH#: 2.5 10*3/uL (ref 0.9–3.3)
LYMPH%: 30.5 % (ref 14.0–49.0)
MCH: 29.2 pg (ref 27.2–33.4)
MCHC: 32.9 g/dL (ref 32.0–36.0)
MCV: 88.6 fL (ref 79.3–98.0)
MONO#: 0.6 10*3/uL (ref 0.1–0.9)
MONO%: 7.6 % (ref 0.0–14.0)
NEUT#: 4.8 10*3/uL (ref 1.5–6.5)
NEUT%: 57.9 % (ref 39.0–75.0)
PLATELETS: 282 10*3/uL (ref 140–400)
RBC: 4.63 10*6/uL (ref 4.20–5.82)
RDW: 15.2 % — ABNORMAL HIGH (ref 11.0–14.6)
WBC: 8.3 10*3/uL (ref 4.0–10.3)

## 2013-12-13 LAB — COMPREHENSIVE METABOLIC PANEL (CC13)
ALBUMIN: 3.8 g/dL (ref 3.5–5.0)
ALT: 30 U/L (ref 0–55)
ANION GAP: 11 meq/L (ref 3–11)
AST: 21 U/L (ref 5–34)
Alkaline Phosphatase: 85 U/L (ref 40–150)
BUN: 15.2 mg/dL (ref 7.0–26.0)
CALCIUM: 9.6 mg/dL (ref 8.4–10.4)
CHLORIDE: 105 meq/L (ref 98–109)
CO2: 24 meq/L (ref 22–29)
Creatinine: 1.2 mg/dL (ref 0.7–1.3)
GLUCOSE: 112 mg/dL (ref 70–140)
POTASSIUM: 4.2 meq/L (ref 3.5–5.1)
SODIUM: 140 meq/L (ref 136–145)
TOTAL PROTEIN: 7.1 g/dL (ref 6.4–8.3)
Total Bilirubin: 0.27 mg/dL (ref 0.20–1.20)

## 2013-12-13 MED ORDER — IMATINIB MESYLATE 100 MG PO TABS
300.0000 mg | ORAL_TABLET | Freq: Every day | ORAL | Status: DC
Start: 2013-12-13 — End: 2014-01-24

## 2013-12-13 MED ORDER — IMATINIB MESYLATE 100 MG PO TABS
300.0000 mg | ORAL_TABLET | Freq: Every day | ORAL | Status: DC
Start: 2013-12-13 — End: 2013-12-13

## 2013-12-13 NOTE — Progress Notes (Signed)
  Timothy OFFICE PROGRESS NOTE   Diagnosis:  Gastrointestinal stromal tumor.  INTERVAL HISTORY:   Timothy Hall returns as scheduled. He began Gleevec 200 mg daily around 11/29/2013. Prior to that date his wife reports he was off of [place] for about 6 days due to an insurance issue. He denies nausea/vomiting. No mouth sores. No diarrhea. Since beginning [place] he has noted more frequent bowel movements, estimating 2-3 a day. He continues to note a rash at the upper back. The rash is unchanged. He reports a good appetite. He denies pain.  Objective:  Vital signs in last 24 hours:  Blood pressure 129/61, pulse 84, temperature 99.1 F (37.3 C), temperature source Oral, resp. rate 18, height 6' (1.829 m), weight 190 lb 3.2 oz (86.274 kg).    HEENT: No thrush or ulcerations. Resp: Lungs clear. Cardio: Regular cardiac rhythm. GI: Abdomen is soft and nontender. No organomegaly. No mass. Protuberant. Vascular: No leg edema.  Skin: Faint fine rash over the upper back.    Lab Results:  Lab Results  Component Value Date   WBC 8.3 12/13/2013   HGB 13.5 12/13/2013   HCT 41.0 12/13/2013   MCV 88.6 12/13/2013   PLT 282 12/13/2013   NEUTROABS 4.8 12/13/2013    Imaging:  No results found.  Medications: I have reviewed the patient's current medications.  Assessment/Plan: 1. Gastrointestinal stromal tumor stage II (T4 NX), most likely arising from the stomach, status post surgical resection including a partial gastrectomy on 07/19/2013, 19 cm cystic mass with a low mitotic rate.  Initiation of adjuvant Gleevec 08/31/2013.  Gleevec dose reduced to 400 mg every other day beginning 10/18/2013 secondary to neutropenia. Gleevec dose adjusted to 200 mg daily beginning 11/29/2013. 2. Mild skin rash most likely secondary to [place]. 3. Mild periorbital edema secondary to [place]. 4. Severe neutropenia secondary to [facility] 10/12/2013. Resolved.   Disposition: He appears stable. He  will increase Gleevec to 300 mg daily. We will repeat a CBC in 3 weeks and see him in 6 weeks. He will contact the office in the interim with any problems. We specifically discussed fever, chills, other signs of infection.  Plan reviewed with [name].    Owens Shark ANP/GNP-BC   12/13/2013  4:37 PM

## 2013-12-13 NOTE — Telephone Encounter (Signed)
gv adn printed appt sched and avs for pt for June and July.... °

## 2014-01-03 ENCOUNTER — Other Ambulatory Visit (HOSPITAL_BASED_OUTPATIENT_CLINIC_OR_DEPARTMENT_OTHER): Payer: BC Managed Care – PPO

## 2014-01-03 ENCOUNTER — Telehealth: Payer: Self-pay | Admitting: *Deleted

## 2014-01-03 DIAGNOSIS — C49A2 Gastrointestinal stromal tumor of stomach: Secondary | ICD-10-CM

## 2014-01-03 DIAGNOSIS — D481 Neoplasm of uncertain behavior of connective and other soft tissue: Secondary | ICD-10-CM

## 2014-01-03 LAB — CBC WITH DIFFERENTIAL/PLATELET
BASO%: 0.9 % (ref 0.0–2.0)
Basophils Absolute: 0.1 10*3/uL (ref 0.0–0.1)
EOS%: 3.6 % (ref 0.0–7.0)
Eosinophils Absolute: 0.3 10*3/uL (ref 0.0–0.5)
HCT: 38.4 % (ref 38.4–49.9)
HGB: 12.8 g/dL — ABNORMAL LOW (ref 13.0–17.1)
LYMPH#: 2.4 10*3/uL (ref 0.9–3.3)
LYMPH%: 28.9 % (ref 14.0–49.0)
MCH: 30.3 pg (ref 27.2–33.4)
MCHC: 33.4 g/dL (ref 32.0–36.0)
MCV: 90.8 fL (ref 79.3–98.0)
MONO#: 0.7 10*3/uL (ref 0.1–0.9)
MONO%: 8.6 % (ref 0.0–14.0)
NEUT#: 4.8 10*3/uL (ref 1.5–6.5)
NEUT%: 58 % (ref 39.0–75.0)
Platelets: 257 10*3/uL (ref 140–400)
RBC: 4.23 10*6/uL (ref 4.20–5.82)
RDW: 14.6 % (ref 11.0–14.6)
WBC: 8.3 10*3/uL (ref 4.0–10.3)

## 2014-01-03 NOTE — Telephone Encounter (Signed)
Left VM asking for CBC results and if his Gleevec dose should remain the same? Made her aware that counts are stable and to continue same dose Gleevec of 300 mg daily. MD will review lab tomorrow. She reports he could not tolerate the 400 mg dose in past.

## 2014-01-05 ENCOUNTER — Telehealth: Payer: Self-pay | Admitting: *Deleted

## 2014-01-05 NOTE — Telephone Encounter (Signed)
Reports he was fired from his last job and now has new job and only day off is Fridays. Asking to move 7/2 visit to a Friday and is willing to see NP or [name]. Called back and gave appointment for 7/10 with [name], NP. She was agreeable.

## 2014-01-19 ENCOUNTER — Other Ambulatory Visit: Payer: BC Managed Care – PPO

## 2014-01-19 ENCOUNTER — Ambulatory Visit: Payer: BC Managed Care – PPO | Admitting: Oncology

## 2014-01-23 ENCOUNTER — Other Ambulatory Visit: Payer: Self-pay | Admitting: *Deleted

## 2014-01-23 NOTE — Telephone Encounter (Signed)
THIS REFILL REQUEST FOR GLEEVEC WAS GIVEN TO DR.SHERRILL'S NURSE, TANYA WHITLOCK,RN.

## 2014-01-24 ENCOUNTER — Other Ambulatory Visit: Payer: Self-pay | Admitting: *Deleted

## 2014-01-24 DIAGNOSIS — C49A2 Gastrointestinal stromal tumor of stomach: Secondary | ICD-10-CM

## 2014-01-24 MED ORDER — IMATINIB MESYLATE 100 MG PO TABS: 300.0000 mg | ORAL_TABLET | Freq: Every day | ORAL | Status: DC

## 2014-01-27 ENCOUNTER — Telehealth: Payer: Self-pay | Admitting: *Deleted

## 2014-01-27 ENCOUNTER — Other Ambulatory Visit (HOSPITAL_BASED_OUTPATIENT_CLINIC_OR_DEPARTMENT_OTHER): Payer: BC Managed Care – PPO

## 2014-01-27 ENCOUNTER — Ambulatory Visit (HOSPITAL_BASED_OUTPATIENT_CLINIC_OR_DEPARTMENT_OTHER): Payer: BC Managed Care – PPO

## 2014-01-27 ENCOUNTER — Ambulatory Visit (HOSPITAL_BASED_OUTPATIENT_CLINIC_OR_DEPARTMENT_OTHER): Payer: BC Managed Care – PPO | Admitting: Nurse Practitioner

## 2014-01-27 ENCOUNTER — Telehealth: Payer: Self-pay | Admitting: Oncology

## 2014-01-27 VITALS — BP 148/81 | HR 64 | Temp 97.1°F | Resp 18 | Ht 72.0 in | Wt 189.9 lb

## 2014-01-27 DIAGNOSIS — R82998 Other abnormal findings in urine: Secondary | ICD-10-CM

## 2014-01-27 DIAGNOSIS — M545 Low back pain, unspecified: Secondary | ICD-10-CM

## 2014-01-27 DIAGNOSIS — R609 Edema, unspecified: Secondary | ICD-10-CM

## 2014-01-27 DIAGNOSIS — D481 Neoplasm of uncertain behavior of connective and other soft tissue: Secondary | ICD-10-CM

## 2014-01-27 DIAGNOSIS — R21 Rash and other nonspecific skin eruption: Secondary | ICD-10-CM

## 2014-01-27 DIAGNOSIS — D490 Neoplasm of unspecified behavior of digestive system: Secondary | ICD-10-CM

## 2014-01-27 DIAGNOSIS — C49A2 Gastrointestinal stromal tumor of stomach: Secondary | ICD-10-CM

## 2014-01-27 DIAGNOSIS — R8290 Unspecified abnormal findings in urine: Secondary | ICD-10-CM

## 2014-01-27 LAB — COMPREHENSIVE METABOLIC PANEL (CC13)
ALBUMIN: 3.7 g/dL (ref 3.5–5.0)
ALT: 22 U/L (ref 0–55)
ANION GAP: 8 meq/L (ref 3–11)
AST: 18 U/L (ref 5–34)
Alkaline Phosphatase: 84 U/L (ref 40–150)
BUN: 17 mg/dL (ref 7.0–26.0)
CALCIUM: 8.9 mg/dL (ref 8.4–10.4)
CO2: 23 meq/L (ref 22–29)
Chloride: 110 mEq/L — ABNORMAL HIGH (ref 98–109)
Creatinine: 1.2 mg/dL (ref 0.7–1.3)
Glucose: 123 mg/dl (ref 70–140)
POTASSIUM: 3.9 meq/L (ref 3.5–5.1)
Sodium: 141 mEq/L (ref 136–145)
Total Bilirubin: 0.28 mg/dL (ref 0.20–1.20)
Total Protein: 6.6 g/dL (ref 6.4–8.3)

## 2014-01-27 LAB — URINALYSIS, MICROSCOPIC - CHCC
BLOOD: NEGATIVE
Bilirubin (Urine): NEGATIVE
Glucose: NEGATIVE mg/dL
KETONES: NEGATIVE mg/dL
Leukocyte Esterase: NEGATIVE
Nitrite: NEGATIVE
Protein: <30 mg/dL
Specific Gravity, Urine: 1.025 (ref 1.003–1.035)
Urobilinogen, UR: 0.2 mg/dL (ref 0.2–1)
pH: 6 (ref 4.6–8.0)

## 2014-01-27 LAB — CBC WITH DIFFERENTIAL/PLATELET
BASO%: 0.8 % (ref 0.0–2.0)
BASOS ABS: 0 10*3/uL (ref 0.0–0.1)
EOS%: 4.5 % (ref 0.0–7.0)
Eosinophils Absolute: 0.3 10*3/uL (ref 0.0–0.5)
HCT: 39.7 % (ref 38.4–49.9)
HEMOGLOBIN: 13 g/dL (ref 13.0–17.1)
LYMPH%: 27.9 % (ref 14.0–49.0)
MCH: 30.5 pg (ref 27.2–33.4)
MCHC: 32.8 g/dL (ref 32.0–36.0)
MCV: 92.9 fL (ref 79.3–98.0)
MONO#: 0.6 10*3/uL (ref 0.1–0.9)
MONO%: 8.4 % (ref 0.0–14.0)
NEUT#: 3.8 10*3/uL (ref 1.5–6.5)
NEUT%: 58.4 % (ref 39.0–75.0)
Platelets: 272 10*3/uL (ref 140–400)
RBC: 4.28 10*6/uL (ref 4.20–5.82)
RDW: 14.6 % (ref 11.0–14.6)
WBC: 6.6 10*3/uL (ref 4.0–10.3)
lymph#: 1.8 10*3/uL (ref 0.9–3.3)

## 2014-01-27 NOTE — Telephone Encounter (Signed)
gv and printed appt sched and avs for pt for Aug and SEpt °

## 2014-01-27 NOTE — Telephone Encounter (Signed)
Message from pt's wife requesting urinalysis results. Reviewed with Lattie Haw, NP: UA negative. Will await culture and call with results next week. She voiced understanding.

## 2014-01-27 NOTE — Progress Notes (Addendum)
  [place] OFFICE PROGRESS NOTE   Diagnosis:  Gastrointestinal stromal tumor.  INTERVAL HISTORY:   Timothy Hall returns as scheduled. He continues Gleevec 300 mg daily. He denies nausea/vomiting. No mouth sores. No diarrhea. He denies shortness of breath. No leg swelling. Persistent skin rash at the upper back.  He has had right-sided low back pain over the past 3-4 days. The pain occurs intermittently and is relieved with Aleve. 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 He is also concerned that the back pain indicates recurrence of the cancer. For the past 3 weeks he has noted that his urine is dark and has a foul odor.  Objective:  Vital signs in last 24 hours:  Blood pressure 148/81, pulse 64, temperature 97.1 F (36.2 C), temperature source Oral, resp. rate 18, height 6' (1.829 m), weight 189 lb 14.4 oz (86.138 kg).    HEENT: No thrush or ulcerations. Resp: Lungs clear. Cardio: Regular cardiac rhythm. GI: Abdomen is soft and nontender. No mass. No organomegaly. Vascular: No leg edema. Neuro: Motor strength 5 over 5. Knee DTRs 2+, symmetric.  Skin: Faint, fine, erythematous rash over the upper back and upper arms. MSK: Tender over the right posterior iliac region.   Lab Results:  Lab Results  Component Value Date   WBC 6.6 01/27/2014   HGB 13.0 01/27/2014   HCT 39.7 01/27/2014   MCV 92.9 01/27/2014   PLT 272 01/27/2014   NEUTROABS 3.8 01/27/2014    Imaging:  No results found.  Medications: I have reviewed the patient's current medications.  Assessment/Plan: 1. Gastrointestinal stromal tumor stage II (T4 NX), most likely arising from the stomach, status post surgical resection including a partial gastrectomy on 07/19/2013, 19 cm cystic mass with a low mitotic rate.  Initiation of adjuvant Gleevec 08/31/2013.  Gleevec dose reduced to 400 mg every other day beginning 10/18/2013 secondary to  neutropenia.  Gleevec dose adjusted to 200 mg daily beginning 11/29/2013. Gleevec dose adjusted to 300 mg daily beginning 12/13/2013. 2. Mild skin rash most likely secondary to [place]. 3. Mild periorbital edema secondary to [place]. 4. Severe neutropenia secondary to [facility] 10/12/2013. Resolved. 5. Urine with abnormal color and odor. 6. Right mid to low back pain.   Disposition: Timothy Hall appears stable. He will continue Gleevec 300 mg daily.  The back pain is most likely related to a benign musculoskeletal condition/injury. He will rest his back over the weekend and continue Aleve as needed. He understands to contact the office if the back pain persists, worsens or he develops any new symptoms.  We will obtain a urinalysis and culture to followup the recent changes he has noted to his urine.  He will return for a repeat CBC in 4 weeks and followup visit in 8 weeks.   Patient seen with [name].   [name] ANP/GNP-BC   01/27/2014  2:57 PM   This was a shared visit with [name]. Timothy Hall was interviewed and examined.  The right lower back pain is likely benign musculoskeletal pain.  [name], M.D.

## 2014-01-28 LAB — URINE CULTURE

## 2014-01-31 ENCOUNTER — Other Ambulatory Visit: Payer: Self-pay | Admitting: Nurse Practitioner

## 2014-01-31 ENCOUNTER — Telehealth: Payer: Self-pay | Admitting: *Deleted

## 2014-01-31 DIAGNOSIS — N390 Urinary tract infection, site not specified: Secondary | ICD-10-CM

## 2014-01-31 DIAGNOSIS — C49A2 Gastrointestinal stromal tumor of stomach: Secondary | ICD-10-CM

## 2014-01-31 MED ORDER — AMOXICILLIN 500 MG PO CAPS: 500.0000 mg | ORAL_CAPSULE | Freq: Three times a day (TID) | ORAL | Status: DC

## 2014-01-31 NOTE — Telephone Encounter (Signed)
Called and informed patient's wife [name]) that patient's urine culture came back positive for infection and a prescription for amoxicillin has been sent to his pharmacy.  Per [name].  Patient's wife verbalized understanding.

## 2014-01-31 NOTE — Telephone Encounter (Signed)
Left message for patient to call back regarding lab results.

## 2014-01-31 NOTE — Telephone Encounter (Signed)
Message copied by Wardell Heath on Tue Jan 31, 2014 10:08 AM ------      Message from: [name] K      Created: Tue Jan 31, 2014  8:55 AM       Please let him know urine culture was positive for infection. I will send prescription for Amoxicillin to his pharmacy.       ----- Message -----         From: [facility] Interface         Sent: 01/27/2014   3:20 PM           To: Owens Shark, NP                   ------

## 2014-02-23 ENCOUNTER — Encounter: Payer: Self-pay | Admitting: Oncology

## 2014-02-23 NOTE — Progress Notes (Signed)
Put wife's fmla form on nurse's desk. °

## 2014-02-24 ENCOUNTER — Telehealth: Payer: Self-pay | Admitting: *Deleted

## 2014-02-24 ENCOUNTER — Encounter: Payer: Self-pay | Admitting: Oncology

## 2014-02-24 ENCOUNTER — Other Ambulatory Visit (HOSPITAL_BASED_OUTPATIENT_CLINIC_OR_DEPARTMENT_OTHER): Payer: BC Managed Care – PPO

## 2014-02-24 DIAGNOSIS — D490 Neoplasm of unspecified behavior of digestive system: Secondary | ICD-10-CM

## 2014-02-24 DIAGNOSIS — D481 Neoplasm of uncertain behavior of connective and other soft tissue: Secondary | ICD-10-CM

## 2014-02-24 LAB — CBC WITH DIFFERENTIAL/PLATELET
BASO%: 1.7 % (ref 0.0–2.0)
Basophils Absolute: 0.1 10*3/uL (ref 0.0–0.1)
EOS ABS: 0.3 10*3/uL (ref 0.0–0.5)
EOS%: 3.8 % (ref 0.0–7.0)
HCT: 40.5 % (ref 38.4–49.9)
HGB: 13.1 g/dL (ref 13.0–17.1)
LYMPH#: 2 10*3/uL (ref 0.9–3.3)
LYMPH%: 26.8 % (ref 14.0–49.0)
MCH: 30.4 pg (ref 27.2–33.4)
MCHC: 32.4 g/dL (ref 32.0–36.0)
MCV: 93.6 fL (ref 79.3–98.0)
MONO#: 0.6 10*3/uL (ref 0.1–0.9)
MONO%: 8.2 % (ref 0.0–14.0)
NEUT%: 59.5 % (ref 39.0–75.0)
NEUTROS ABS: 4.5 10*3/uL (ref 1.5–6.5)
Platelets: 281 10*3/uL (ref 140–400)
RBC: 4.32 10*6/uL (ref 4.20–5.82)
RDW: 14.3 % (ref 11.0–14.6)
WBC: 7.5 10*3/uL (ref 4.0–10.3)

## 2014-02-24 NOTE — Telephone Encounter (Signed)
Pt's wife returned call, lab results given. She voiced understanding. Pt will continue same Gleevec dose.

## 2014-02-24 NOTE — Progress Notes (Signed)
Put wife's fmla form in registration desk °

## 2014-02-24 NOTE — Telephone Encounter (Signed)
Message copied by Wardell Heath on Fri Feb 24, 2014  2:25 PM ------      Message from: [place], [place] K      Created: Fri Feb 24, 2014  2:21 PM       Please let him know CBC is stable. Continue Gleevec at current dose.      ----- Message -----         From: [facility]         Sent: 02/24/2014  11:07 AM           To: Owens Shark, NP                   ------

## 2014-02-24 NOTE — Telephone Encounter (Signed)
Left message for patient to call back regarding lab results.

## 2014-02-24 NOTE — Progress Notes (Signed)
Put wife's fmla form on nurse's desk. °

## 2014-03-31 ENCOUNTER — Telehealth: Payer: Self-pay | Admitting: Nurse Practitioner

## 2014-03-31 ENCOUNTER — Telehealth: Payer: Self-pay

## 2014-03-31 ENCOUNTER — Other Ambulatory Visit: Payer: Self-pay | Admitting: *Deleted

## 2014-03-31 ENCOUNTER — Ambulatory Visit (HOSPITAL_BASED_OUTPATIENT_CLINIC_OR_DEPARTMENT_OTHER): Payer: BC Managed Care – PPO | Admitting: Nurse Practitioner

## 2014-03-31 ENCOUNTER — Other Ambulatory Visit (HOSPITAL_BASED_OUTPATIENT_CLINIC_OR_DEPARTMENT_OTHER): Payer: BC Managed Care – PPO

## 2014-03-31 VITALS — BP 136/63 | HR 69 | Temp 98.0°F | Resp 18 | Ht 72.0 in | Wt 186.4 lb

## 2014-03-31 DIAGNOSIS — R21 Rash and other nonspecific skin eruption: Secondary | ICD-10-CM

## 2014-03-31 DIAGNOSIS — R609 Edema, unspecified: Secondary | ICD-10-CM

## 2014-03-31 DIAGNOSIS — D481 Neoplasm of uncertain behavior of connective and other soft tissue: Secondary | ICD-10-CM

## 2014-03-31 DIAGNOSIS — D490 Neoplasm of unspecified behavior of digestive system: Secondary | ICD-10-CM

## 2014-03-31 DIAGNOSIS — C49A2 Gastrointestinal stromal tumor of stomach: Secondary | ICD-10-CM

## 2014-03-31 DIAGNOSIS — Z23 Encounter for immunization: Secondary | ICD-10-CM

## 2014-03-31 DIAGNOSIS — R252 Cramp and spasm: Secondary | ICD-10-CM

## 2014-03-31 LAB — CBC WITH DIFFERENTIAL/PLATELET
BASO%: 0.9 % (ref 0.0–2.0)
BASOS ABS: 0.1 10*3/uL (ref 0.0–0.1)
EOS ABS: 0.3 10*3/uL (ref 0.0–0.5)
EOS%: 4.2 % (ref 0.0–7.0)
HCT: 40.1 % (ref 38.4–49.9)
HGB: 13.2 g/dL (ref 13.0–17.1)
LYMPH%: 25.1 % (ref 14.0–49.0)
MCH: 31.2 pg (ref 27.2–33.4)
MCHC: 33 g/dL (ref 32.0–36.0)
MCV: 94.6 fL (ref 79.3–98.0)
MONO#: 0.6 10*3/uL (ref 0.1–0.9)
MONO%: 8.2 % (ref 0.0–14.0)
NEUT%: 61.6 % (ref 39.0–75.0)
NEUTROS ABS: 4.5 10*3/uL (ref 1.5–6.5)
PLATELETS: 265 10*3/uL (ref 140–400)
RBC: 4.24 10*6/uL (ref 4.20–5.82)
RDW: 13.7 % (ref 11.0–14.6)
WBC: 7.2 10*3/uL (ref 4.0–10.3)
lymph#: 1.8 10*3/uL (ref 0.9–3.3)

## 2014-03-31 LAB — COMPREHENSIVE METABOLIC PANEL (CC13)
ALT: 21 U/L (ref 0–55)
AST: 24 U/L (ref 5–34)
Albumin: 3.7 g/dL (ref 3.5–5.0)
Alkaline Phosphatase: 81 U/L (ref 40–150)
Anion Gap: 10 mEq/L (ref 3–11)
BUN: 15.7 mg/dL (ref 7.0–26.0)
CO2: 24 mEq/L (ref 22–29)
Calcium: 9 mg/dL (ref 8.4–10.4)
Chloride: 108 mEq/L (ref 98–109)
Creatinine: 1.3 mg/dL (ref 0.7–1.3)
Glucose: 114 mg/dl (ref 70–140)
Potassium: 3.8 mEq/L (ref 3.5–5.1)
Sodium: 141 mEq/L (ref 136–145)
Total Bilirubin: 0.34 mg/dL (ref 0.20–1.20)
Total Protein: 6.9 g/dL (ref 6.4–8.3)

## 2014-03-31 LAB — MAGNESIUM (CC13): MAGNESIUM: 2.4 mg/dL (ref 1.5–2.5)

## 2014-03-31 MED ORDER — INFLUENZA VAC SPLIT QUAD 0.5 ML IM SUSY
0.5000 mL | PREFILLED_SYRINGE | Freq: Once | INTRAMUSCULAR | Status: AC
  Administered 2014-03-31: 0.5 mL via INTRAMUSCULAR
  Filled 2014-03-31: qty 0.5

## 2014-03-31 MED ORDER — IMATINIB MESYLATE 100 MG PO TABS: 300.0000 mg | ORAL_TABLET | Freq: Every day | ORAL | Status: DC

## 2014-03-31 NOTE — Telephone Encounter (Signed)
Pt's wife returned call. Magnesium results given. She requests refill to be faxed back to [facility] so pt may pick up this afternoon. Same done.

## 2014-03-31 NOTE — Telephone Encounter (Signed)
THIS REFILL REQUEST FOR GLEEVEC WAS PLACED ON DR.SHERRILL'S DESK.

## 2014-03-31 NOTE — Telephone Encounter (Signed)
Pt confirmed labs/ov per 09/11 POF, gave pt AVS...Marland KitchenMarland KitchenKJ

## 2014-03-31 NOTE — Telephone Encounter (Signed)
Message copied by Bevelyn Ngo on Fri Mar 31, 2014 12:08 PM ------      Message from: [place], [place] K      Created: Fri Mar 31, 2014 11:59 AM       Please let him know magnesium level was normal. If the cramps persist please ask him to call the office next week. ------

## 2014-03-31 NOTE — Progress Notes (Addendum)
  Timothy Hall OFFICE PROGRESS NOTE   Diagnosis:  Gastrointestinal stromal tumor  INTERVAL HISTORY:   Timothy Hall returns as scheduled. He continues Gleevec 300 mg daily. He denies nausea/vomiting. No diarrhea. No shortness of breath or cough. He continues to note mild periorbital edema. Skin rash is stable. Over the past 3-4 weeks he has noted intermittent ankle edema as well as "cramps" involving various muscles including the legs, arms and hands. His wife reports overall poor fluid intake but no different than in the past. He mainly drinks sodas.  Objective:  Vital signs in last 24 hours:  Blood pressure 136/63, pulse 69, temperature 98 F (36.7 C), temperature source Oral, resp. rate 18, height 6' (1.829 m), weight 186 lb 6.4 oz (84.55 kg), SpO2 100.00%.    HEENT: Mild periorbital edema. No thrush or ulcers. Resp: Lungs clear bilaterally. Cardio: Regular rate and rhythm. GI: Abdomen soft and nontender. No hepatomegaly. No mass. Vascular: Trace pretibial and ankle edema bilaterally.  Skin: Faint, fine, erythematous rash over the upper back.   Lab Results:  Lab Results  Component Value Date   WBC 7.2 03/31/2014   HGB 13.2 03/31/2014   HCT 40.1 03/31/2014   MCV 94.6 03/31/2014   PLT 265 03/31/2014   NEUTROABS 4.5 03/31/2014    Imaging:  No results found.  Medications: I have reviewed the patient's current medications.  Assessment/Plan: 1. Gastrointestinal stromal tumor stage II (T4 NX), most likely arising from the stomach, status post surgical resection including a partial gastrectomy on 07/19/2013, 19 cm cystic mass with a low mitotic rate.  Initiation of adjuvant Gleevec 08/31/2013.  Gleevec dose reduced to 400 mg every other day beginning 10/18/2013 secondary to neutropenia.  Gleevec dose adjusted to 200 mg daily beginning 11/29/2013.  Gleevec dose adjusted to 300 mg daily beginning 12/13/2013. 2. Mild skin rash most likely secondary to [place]. 3. Mild  periorbital edema secondary to [place]. 4. Severe neutropenia secondary to [facility] 10/12/2013. Resolved. 5. Right mid to low back pain reported when here 01/27/2014. He did not complain of back pain at today's visit. 6. "Cramps" involving various muscles. 7. Mild lower extremity edema likely related to [place].   Disposition: Timothy Hall will continue Gleevec 300 mg daily. The etiology of the muscle cramps is unclear. We will check a magnesium today. He will try to increase fluid intake with more water. He will contact the office next week if the cramps persist and we will hold the [place] for 3 or 4 days to see if there is any improvement.   The mild leg edema is likely related to [place].   He will return for a lab visit in 4 weeks and a followup visit in 8 weeks. He will contact the office in the interim as outlined above or with any other problems.  Influenza vaccine was administered at today's visit.  Patient seen with [name]. 25 minutes were spent face-to-face at today's visit with the majority of the time involved in counseling/coordination of care.   [name] ANP/GNP-BC   03/31/2014  11:27 AM  This was a shared visit with [name].  The mild leg edema is likely related to [place]. The "cramps "may be related to [place]. If the cramps do not improve with oral hydration he will discontinue Gleevec for several days and report back to [place].  [name], M.D.

## 2014-03-31 NOTE — Telephone Encounter (Signed)
Called and informed him Magnesium level was normal and to call us next week if cramps persist per [name], NP Pt verbalized understanding and denies any questions or concerns at this time.

## 2014-04-03 ENCOUNTER — Telehealth: Payer: Self-pay | Admitting: Oncology

## 2014-04-03 NOTE — Telephone Encounter (Signed)
Per NP/LT [facility] is on hold moved pt to [facility] schedule, lft msg w/pt and mailed out schedule w/new time...KJ

## 2014-04-28 ENCOUNTER — Other Ambulatory Visit (HOSPITAL_BASED_OUTPATIENT_CLINIC_OR_DEPARTMENT_OTHER): Payer: BC Managed Care – PPO

## 2014-04-28 DIAGNOSIS — C49A2 Gastrointestinal stromal tumor of stomach: Secondary | ICD-10-CM

## 2014-04-28 DIAGNOSIS — D481 Neoplasm of uncertain behavior of connective and other soft tissue: Secondary | ICD-10-CM

## 2014-04-28 LAB — CBC WITH DIFFERENTIAL/PLATELET
BASO%: 1 % (ref 0.0–2.0)
BASOS ABS: 0.1 10*3/uL (ref 0.0–0.1)
EOS ABS: 0.2 10*3/uL (ref 0.0–0.5)
EOS%: 3.8 % (ref 0.0–7.0)
HEMATOCRIT: 37.6 % — AB (ref 38.4–49.9)
HEMOGLOBIN: 12.4 g/dL — AB (ref 13.0–17.1)
LYMPH#: 1.9 10*3/uL (ref 0.9–3.3)
LYMPH%: 30 % (ref 14.0–49.0)
MCH: 31 pg (ref 27.2–33.4)
MCHC: 32.9 g/dL (ref 32.0–36.0)
MCV: 94.1 fL (ref 79.3–98.0)
MONO#: 0.5 10*3/uL (ref 0.1–0.9)
MONO%: 7.9 % (ref 0.0–14.0)
NEUT#: 3.7 10*3/uL (ref 1.5–6.5)
NEUT%: 57.3 % (ref 39.0–75.0)
PLATELETS: 264 10*3/uL (ref 140–400)
RBC: 3.99 10*6/uL — ABNORMAL LOW (ref 4.20–5.82)
RDW: 12.9 % (ref 11.0–14.6)
WBC: 6.4 10*3/uL (ref 4.0–10.3)

## 2014-05-08 ENCOUNTER — Encounter: Payer: Self-pay | Admitting: Internal Medicine

## 2014-05-26 ENCOUNTER — Other Ambulatory Visit (HOSPITAL_BASED_OUTPATIENT_CLINIC_OR_DEPARTMENT_OTHER): Payer: BC Managed Care – PPO

## 2014-05-26 ENCOUNTER — Ambulatory Visit (HOSPITAL_BASED_OUTPATIENT_CLINIC_OR_DEPARTMENT_OTHER): Payer: BC Managed Care – PPO | Admitting: Oncology

## 2014-05-26 ENCOUNTER — Ambulatory Visit: Payer: BC Managed Care – PPO | Admitting: Nurse Practitioner

## 2014-05-26 ENCOUNTER — Telehealth: Payer: Self-pay | Admitting: Oncology

## 2014-05-26 ENCOUNTER — Other Ambulatory Visit: Payer: BC Managed Care – PPO

## 2014-05-26 VITALS — BP 148/65 | HR 64 | Temp 98.1°F | Resp 18 | Ht 72.0 in | Wt 185.8 lb

## 2014-05-26 DIAGNOSIS — D481 Neoplasm of uncertain behavior of connective and other soft tissue: Secondary | ICD-10-CM

## 2014-05-26 DIAGNOSIS — D490 Neoplasm of unspecified behavior of digestive system: Secondary | ICD-10-CM

## 2014-05-26 DIAGNOSIS — R609 Edema, unspecified: Secondary | ICD-10-CM

## 2014-05-26 DIAGNOSIS — R21 Rash and other nonspecific skin eruption: Secondary | ICD-10-CM

## 2014-05-26 DIAGNOSIS — C49A2 Gastrointestinal stromal tumor of stomach: Secondary | ICD-10-CM

## 2014-05-26 LAB — COMPREHENSIVE METABOLIC PANEL (CC13)
ALBUMIN: 3.6 g/dL (ref 3.5–5.0)
ALT: 20 U/L (ref 0–55)
AST: 18 U/L (ref 5–34)
Alkaline Phosphatase: 84 U/L (ref 40–150)
Anion Gap: 8 mEq/L (ref 3–11)
BUN: 17.5 mg/dL (ref 7.0–26.0)
CALCIUM: 9.3 mg/dL (ref 8.4–10.4)
CO2: 26 meq/L (ref 22–29)
Chloride: 106 mEq/L (ref 98–109)
Creatinine: 1.1 mg/dL (ref 0.7–1.3)
Glucose: 106 mg/dl (ref 70–140)
POTASSIUM: 3.7 meq/L (ref 3.5–5.1)
SODIUM: 141 meq/L (ref 136–145)
TOTAL PROTEIN: 6.5 g/dL (ref 6.4–8.3)
Total Bilirubin: 0.24 mg/dL (ref 0.20–1.20)

## 2014-05-26 LAB — CBC WITH DIFFERENTIAL/PLATELET
BASO%: 0.3 % (ref 0.0–2.0)
Basophils Absolute: 0 10*3/uL (ref 0.0–0.1)
EOS%: 4.6 % (ref 0.0–7.0)
Eosinophils Absolute: 0.3 10*3/uL (ref 0.0–0.5)
HCT: 38 % — ABNORMAL LOW (ref 38.4–49.9)
HGB: 12.6 g/dL — ABNORMAL LOW (ref 13.0–17.1)
LYMPH#: 2.6 10*3/uL (ref 0.9–3.3)
LYMPH%: 34.8 % (ref 14.0–49.0)
MCH: 31.2 pg (ref 27.2–33.4)
MCHC: 33.2 g/dL (ref 32.0–36.0)
MCV: 94.1 fL (ref 79.3–98.0)
MONO#: 0.7 10*3/uL (ref 0.1–0.9)
MONO%: 8.8 % (ref 0.0–14.0)
NEUT#: 3.9 10*3/uL (ref 1.5–6.5)
NEUT%: 51.5 % (ref 39.0–75.0)
Platelets: 265 10*3/uL (ref 140–400)
RBC: 4.04 10*6/uL — ABNORMAL LOW (ref 4.20–5.82)
RDW: 12.9 % (ref 11.0–14.6)
WBC: 7.5 10*3/uL (ref 4.0–10.3)

## 2014-05-26 NOTE — Patient Instructions (Signed)
[place] Discharge Instructions  RECOMMENDATIONS MADE BY THE CONSULTANT AND ANY TEST RESULTS WILL BE SENT TO YOUR REFERRING PHYSICIAN.  MEDICATIONS PRESCRIBED:    INSTRUCTIONS GIVEN AND DISCUSSED:   SPECIAL INSTRUCTIONS/FOLLOW-UP:   Thank you for choosing [place] [facility] [place] to provide your oncology and hematology care.  To afford each patient quality time with our providers, please arrive at least 30 minutes before your scheduled appointment time.  With your help, our goal is to use those 30 minutes to complete the necessary work-up to ensure our physicians have the information they need to help with your evaluation and healthcare recommendations.     ___________________  Should you have questions after your visit to [facility], please contact our office at [phone] between the hours of 8:30 a.m. and 4:30 p.m.  Voicemails left after 4:00 p.m. will not be returned until the following business day.  For prescription refill requests, have your pharmacy contact our office with your prescription refill request. We request 24 hour notice for all refill requests.

## 2014-05-26 NOTE — Telephone Encounter (Signed)
Gave avs & Cal for Dec.

## 2014-05-26 NOTE — Progress Notes (Signed)
  Timothy Hall OFFICE PROGRESS NOTE   Diagnosis: gastrointestinal stromal tumor  INTERVAL HISTORY:   Timothy Hall returns as scheduled. Timothy Hall continues Gleevec daily. No diarrhea. Minimal skin rash. The leg swelling has improved. The leg cramps resolved. Timothy Hall had a transient episode of vertigo one evening recently. Timothy Hall  Is working full-time.  Objective:  Vital signs in last 24 hours:  Blood pressure 148/65, pulse 64, temperature 98.1 F (36.7 C), temperature source Oral, resp. rate 18, height 6' (1.829 m), weight 185 lb 12.8 oz (84.278 kg).    HEENT: no thrush or ulcers Resp: lungs clear bilaterally Cardio: regular rate and rhythm GI: no hepatomegaly, nontender, no mass Vascular: trace pitting edema at the left greater than right lower leg. No erythema or tenderness.  Skin: faint erythematous rash over the back    Lab Results:  Lab Results  Component Value Date   WBC 7.5 05/26/2014   HGB 12.6* 05/26/2014   HCT 38.0* 05/26/2014   MCV 94.1 05/26/2014   PLT 265 05/26/2014   NEUTROABS 3.9 05/26/2014    Medications: I have reviewed the patient's current medications.  Assessment/Plan: 1. Gastrointestinal stromal tumor stage II (T4 NX), most likely arising from the stomach, status post surgical resection including a partial gastrectomy on 07/19/2013, 19 cm cystic mass with a low mitotic rate.  Initiation of adjuvant Gleevec 08/31/2013.   Gleevec dose reduced to 400 mg every other day beginning 10/18/2013 secondary to neutropenia.   Gleevec dose adjusted to 200 mg daily beginning 11/29/2013.   Gleevec dose adjusted to 300 mg daily beginning 12/13/2013. 2. Mild skin rash most likely secondary to [place]. 3. Mild periorbital edema secondary to [place]. 4. Severe neutropenia secondary to [facility] 10/12/2013. Resolved. 5. Mild lower extremity edema likely related to [place].   Disposition:  Timothy Hall remains in clinical remission from the gastrointestinal stromal tumor.Timothy Hall will  continue [place]. The leg edema is most likely related to [place]. Timothy Hall will contact us for increased edema or leg pain. I have a low clinical suspicion for a DVT.  Timothy Hall will return for an office and lab visit in 6 weeks.  Betsy Coder, MD  05/26/2014  9:38 AM

## 2014-05-30 ENCOUNTER — Other Ambulatory Visit: Payer: Self-pay | Admitting: *Deleted

## 2014-05-30 DIAGNOSIS — C49A2 Gastrointestinal stromal tumor of stomach: Secondary | ICD-10-CM

## 2014-05-30 NOTE — Telephone Encounter (Signed)
THIS REFILL REQUEST FOR GLEEVEC WAS PLACED ON DR.SHERRILL'S DESK.

## 2014-05-31 ENCOUNTER — Other Ambulatory Visit: Payer: Self-pay | Admitting: Oncology

## 2014-06-09 ENCOUNTER — Other Ambulatory Visit (INDEPENDENT_AMBULATORY_CARE_PROVIDER_SITE_OTHER): Payer: Self-pay

## 2014-06-09 DIAGNOSIS — C49A0 Gastrointestinal stromal tumor, unspecified site: Secondary | ICD-10-CM

## 2014-06-12 ENCOUNTER — Telehealth: Payer: Self-pay | Admitting: Internal Medicine

## 2014-06-12 NOTE — Telephone Encounter (Signed)
PATENT WIFE CALLED INQUIRING ABOUT GETTING AN ENDO BEFORE THE END OF December AND i TOLD HER THAT WE DID NOT HAVE ANY OPENINGS UNTIL THE END OF December.

## 2014-06-12 NOTE — Telephone Encounter (Signed)
I spoke with the pts wife. Pt is doing ok, he is not currently having any problems. He has a follow up appt with Dr.Ingram the first week of January and was told last week that he would need an EGD prior to his ov with them. She called [facility] and they are going to work him in and get procedure done there.

## 2014-06-13 NOTE — Telephone Encounter (Signed)
Noted  

## 2014-07-06 ENCOUNTER — Encounter (HOSPITAL_COMMUNITY): Payer: Self-pay | Admitting: *Deleted

## 2014-07-07 ENCOUNTER — Telehealth: Payer: Self-pay | Admitting: Nurse Practitioner

## 2014-07-07 ENCOUNTER — Ambulatory Visit
Admission: RE | Admit: 2014-07-07 | Discharge: 2014-07-07 | Disposition: A | Discharge status: Home / Self Care | Payer: BC Managed Care – PPO | Source: Ambulatory Visit | Attending: General Surgery | Admitting: General Surgery

## 2014-07-07 ENCOUNTER — Ambulatory Visit (HOSPITAL_BASED_OUTPATIENT_CLINIC_OR_DEPARTMENT_OTHER): Payer: BC Managed Care – PPO | Admitting: Nurse Practitioner

## 2014-07-07 ENCOUNTER — Other Ambulatory Visit (HOSPITAL_BASED_OUTPATIENT_CLINIC_OR_DEPARTMENT_OTHER): Payer: BC Managed Care – PPO

## 2014-07-07 VITALS — BP 136/59 | HR 69 | Temp 98.1°F | Resp 19 | Ht 72.0 in | Wt 184.1 lb

## 2014-07-07 DIAGNOSIS — Z8500 Personal history of malignant neoplasm of unspecified digestive organ: Secondary | ICD-10-CM

## 2014-07-07 DIAGNOSIS — D490 Neoplasm of unspecified behavior of digestive system: Secondary | ICD-10-CM

## 2014-07-07 DIAGNOSIS — C49A0 Gastrointestinal stromal tumor, unspecified site: Secondary | ICD-10-CM

## 2014-07-07 DIAGNOSIS — C49A2 Gastrointestinal stromal tumor of stomach: Secondary | ICD-10-CM

## 2014-07-07 LAB — COMPREHENSIVE METABOLIC PANEL (CC13)
ALT: 25 U/L (ref 0–55)
ANION GAP: 9 meq/L (ref 3–11)
AST: 24 U/L (ref 5–34)
Albumin: 3.8 g/dL (ref 3.5–5.0)
Alkaline Phosphatase: 90 U/L (ref 40–150)
BUN: 15 mg/dL (ref 7.0–26.0)
CALCIUM: 8.9 mg/dL (ref 8.4–10.4)
CHLORIDE: 103 meq/L (ref 98–109)
CO2: 27 meq/L (ref 22–29)
CREATININE: 1.1 mg/dL (ref 0.7–1.3)
EGFR: 71 mL/min/{1.73_m2} — AB (ref >90)
Glucose: 105 mg/dl (ref 70–140)
Potassium: 4 mEq/L (ref 3.5–5.1)
Sodium: 139 mEq/L (ref 136–145)
TOTAL PROTEIN: 6.6 g/dL (ref 6.4–8.3)
Total Bilirubin: 0.25 mg/dL (ref 0.20–1.20)

## 2014-07-07 LAB — CBC WITH DIFFERENTIAL/PLATELET
BASO%: 0.6 % (ref 0.0–2.0)
BASOS ABS: 0 10*3/uL (ref 0.0–0.1)
EOS%: 3.5 % (ref 0.0–7.0)
Eosinophils Absolute: 0.3 10*3/uL (ref 0.0–0.5)
HCT: 39.4 % (ref 38.4–49.9)
HEMOGLOBIN: 12.8 g/dL — AB (ref 13.0–17.1)
LYMPH#: 2 10*3/uL (ref 0.9–3.3)
LYMPH%: 27.6 % (ref 14.0–49.0)
MCH: 30.9 pg (ref 27.2–33.4)
MCHC: 32.5 g/dL (ref 32.0–36.0)
MCV: 95.1 fL (ref 79.3–98.0)
MONO#: 0.6 10*3/uL (ref 0.1–0.9)
MONO%: 7.8 % (ref 0.0–14.0)
NEUT#: 4.5 10*3/uL (ref 1.5–6.5)
NEUT%: 60.5 % (ref 39.0–75.0)
Platelets: 280 10*3/uL (ref 140–400)
RBC: 4.14 10*6/uL — AB (ref 4.20–5.82)
RDW: 13.4 % (ref 11.0–14.6)
WBC: 7.4 10*3/uL (ref 4.0–10.3)

## 2014-07-07 MED ORDER — IOHEXOL 300 MG/ML  SOLN
100.0000 mL | Freq: Once | INTRAMUSCULAR | Status: AC | PRN
  Administered 2014-07-07: 100 mL via INTRAVENOUS

## 2014-07-07 NOTE — Progress Notes (Signed)
  [facility] OFFICE PROGRESS NOTE   Diagnosis:  Gastrointestinal stromal tumor  INTERVAL HISTORY:   Mr. Knightly returns as scheduled. He continues [place]. He denies nausea/vomiting. Mouth sores. No diarrhea except this morning which he relates to CT contrast. He intermittently notes bright red blood on the toilet tissue after a "hard" bowel movement. His primary doctor gave him stool cards to complete. He has not yet completed the stool cards. He has a stable rash at the upper back. He continues to have muscle cramps. No abdominal pain.  Objective:  Vital signs in last 24 hours:  Blood pressure 136/59, pulse 69, temperature 98.1 F (36.7 C), temperature source Oral, resp. rate 19, height 6' (1.829 m), weight 184 lb 1.6 oz (83.507 kg), SpO2 100 %.    HEENT: No thrush or ulcers. Resp: Lungs clear bilaterally. Cardio: Regular rate and rhythm. GI: Abdomen soft and nontender. No hepatomegaly. No mass. Vascular: No leg edema.  Skin: Faint erythematous rash at the upper back.    Lab Results:  Lab Results  Component Value Date   WBC 7.4 07/07/2014   HGB 12.8* 07/07/2014   HCT 39.4 07/07/2014   MCV 95.1 07/07/2014   PLT 280 07/07/2014   NEUTROABS 4.5 07/07/2014     Medications: I have reviewed the patient's current medications.  Assessment/Plan: 1. Gastrointestinal stromal tumor stage II (T4 NX), most likely arising from the stomach, status post surgical resection including a partial gastrectomy on 07/19/2013, 19 cm cystic mass with a low mitotic rate.  Initiation of adjuvant Gleevec 08/31/2013.   Gleevec dose reduced to 400 mg every other day beginning 10/18/2013 secondary to neutropenia.   Gleevec dose adjusted to 200 mg daily beginning 11/29/2013.   Gleevec dose adjusted to 300 mg daily beginning 12/13/2013. 2. Mild skin rash most likely secondary to [place]. 3. Mild periorbital edema secondary to [place]. 4. Severe neutropenia secondary to [facility]  10/12/2013. Resolved. 5. Mild lower extremity edema likely related to [place]. No edema on exam 07/07/2014.   Disposition: Mr. Windt remains in clinical remission from the gastrointestinal stromal tumor. He will continue Gleevec.  He had a CT scan this morning and has a follow-up visit with [name] in January to review the results.  He has an upcoming appointment with Dr. [name]. He will discuss the rectal bleeding with him. Of note, he has never had a screening colonoscopy.  We scheduled a return visit with labs in 6 weeks. He will contact the office in the interim with any problems.  Plan reviewed with [name].    [name] ANP/GNP-BC   07/07/2014  10:25 AM

## 2014-07-07 NOTE — Telephone Encounter (Signed)
Gave avs & cal for Jan. °

## 2014-07-11 ENCOUNTER — Encounter: Payer: Self-pay | Admitting: Oncology

## 2014-07-11 ENCOUNTER — Other Ambulatory Visit: Payer: Self-pay | Admitting: Gastroenterology

## 2014-07-11 NOTE — Addendum Note (Signed)
Addended by: Jonae Renshaw on: 07/11/2014 05:02 PM   Modules accepted: Orders  

## 2014-07-12 ENCOUNTER — Encounter (HOSPITAL_COMMUNITY)
Admission: RE | Disposition: A | Discharge status: Home / Self Care | Payer: BC Managed Care – PPO | Source: Ambulatory Visit | Attending: Gastroenterology

## 2014-07-12 ENCOUNTER — Ambulatory Visit (HOSPITAL_COMMUNITY)
Admission: RE | Admit: 2014-07-12 | Discharge: 2014-07-12 | Disposition: A | Discharge status: Home / Self Care | Payer: BC Managed Care – PPO | Source: Ambulatory Visit | Attending: Gastroenterology | Admitting: Gastroenterology

## 2014-07-12 ENCOUNTER — Encounter (HOSPITAL_COMMUNITY): Payer: Self-pay | Admitting: Registered Nurse

## 2014-07-12 ENCOUNTER — Ambulatory Visit (HOSPITAL_COMMUNITY): Payer: BC Managed Care – PPO | Admitting: Anesthesiology

## 2014-07-12 DIAGNOSIS — G4730 Sleep apnea, unspecified: Secondary | ICD-10-CM | POA: Diagnosis not present

## 2014-07-12 DIAGNOSIS — K449 Diaphragmatic hernia without obstruction or gangrene: Secondary | ICD-10-CM | POA: Diagnosis not present

## 2014-07-12 DIAGNOSIS — R1900 Intra-abdominal and pelvic swelling, mass and lump, unspecified site: Secondary | ICD-10-CM | POA: Diagnosis present

## 2014-07-12 DIAGNOSIS — K219 Gastro-esophageal reflux disease without esophagitis: Secondary | ICD-10-CM | POA: Insufficient documentation

## 2014-07-12 DIAGNOSIS — I10 Essential (primary) hypertension: Secondary | ICD-10-CM | POA: Insufficient documentation

## 2014-07-12 DIAGNOSIS — F17200 Nicotine dependence, unspecified, uncomplicated: Secondary | ICD-10-CM | POA: Insufficient documentation

## 2014-07-12 HISTORY — DX: Other complications of anesthesia, initial encounter: T88.59XA

## 2014-07-12 HISTORY — DX: Malignant (primary) neoplasm, unspecified: C80.1

## 2014-07-12 HISTORY — DX: Rash and other nonspecific skin eruption: R21

## 2014-07-12 HISTORY — PX: EUS: SHX5427

## 2014-07-12 HISTORY — DX: Adverse effect of unspecified anesthetic, initial encounter: T41.45XA

## 2014-07-12 SURGERY — ESOPHAGEAL ENDOSCOPIC ULTRASOUND (EUS) RADIAL
Anesthesia: Monitor Anesthesia Care

## 2014-07-12 MED ORDER — PROPOFOL 10 MG/ML IV BOLUS
INTRAVENOUS | Status: AC
  Filled 2014-07-12: qty 20

## 2014-07-12 MED ORDER — BUTAMBEN-TETRACAINE-BENZOCAINE 2-2-14 % EX AERO
INHALATION_SPRAY | CUTANEOUS | Status: DC | PRN
  Administered 2014-07-12: 2 via TOPICAL

## 2014-07-12 MED ORDER — SODIUM CHLORIDE 0.9 % IV SOLN: INTRAVENOUS | Status: DC

## 2014-07-12 MED ORDER — PROPOFOL INFUSION 10 MG/ML OPTIME
INTRAVENOUS | Status: DC | PRN
  Administered 2014-07-12: 300 ug/kg/min via INTRAVENOUS

## 2014-07-12 MED ORDER — LACTATED RINGERS IV SOLN
INTRAVENOUS | Status: DC
  Administered 2014-07-12: 1000 mL via INTRAVENOUS

## 2014-07-12 NOTE — Interval H&P Note (Signed)
History and Physical Interval Note:  07/12/2014 8:49 AM  Timothy Hall  has presented today for surgery, with the diagnosis of possible tumor regrowth abdominal cystic mass  The various methods of treatment have been discussed with the patient and family. After consideration of risks, benefits and other options for treatment, the patient has consented to  Procedure(s): ESOPHAGEAL ENDOSCOPIC ULTRASOUND (EUS) RADIAL (N/A) as a surgical intervention .  The patient's history has been reviewed, patient examined, no change in status, stable for surgery.  I have reviewed the patient's chart and labs.  Questions were answered to the patient's satisfaction.     Marsh Heckler M  Assessment:  1.  GIST resection one year ago.    Plan:  1.  Endoscopy to assess for tumor recurrence. 2.  Case discussed in detail today with [name], who agrees with doing the endoscopy. 3.  Risks (bleeding, infection, bowel perforation that could require surgery, sedation-related changes in cardiopulmonary systems), benefits (identification and possible treatment of source of symptoms, exclusion of certain causes of symptoms), and alternatives (watchful waiting, radiographic imaging studies, empiric medical treatment) of upper endoscopy (EGD) were explained to patient/family in detail and patient wishes to proceed.

## 2014-07-12 NOTE — Discharge Instructions (Signed)
Endoscopy Care After Please read the instructions outlined below and refer to this sheet in the next few weeks. These discharge instructions provide you with general information on caring for yourself after you leave the hospital. Your doctor may also give you specific instructions. While your treatment has been planned according to the most current medical practices available, unavoidable complications occasionally occur. If you have any problems or questions after discharge, please call Dr. [name] [facility]) at [phone].  HOME CARE INSTRUCTIONS Activity  You may resume your regular activity but move at a slower pace for the next 24 hours.   Take frequent rest periods for the next 24 hours.   Walking will help expel (get rid of) the air and reduce the bloated feeling in your abdomen.   No driving for 24 hours (because of the anesthesia (medicine) used during the test).   You may shower.   Do not sign any important legal documents or operate any machinery for 24 hours (because of the anesthesia used during the test).  Nutrition  Drink plenty of fluids.   You may resume your normal diet.   Begin with a light meal and progress to your normal diet.   Avoid alcoholic beverages for 24 hours or as instructed by your caregiver.  Medications You may resume your normal medications unless your caregiver tells you otherwise. What you can expect today  You may experience abdominal discomfort such as a feeling of fullness or "gas" pains.   You may experience a sore throat for 2 to 3 days. This is normal. Gargling with salt water may help this.    SEEK IMMEDIATE MEDICAL CARE IF:  You have excessive nausea (feeling sick to your stomach) and/or vomiting.   You have severe abdominal pain and distention (swelling).   You have trouble swallowing.   You have a temperature over 100 F (37.8 C).   You have rectal bleeding or vomiting of blood.  Document Released:  02/19/2004 Document Revised: 03/19/2011 Document Reviewed: 09/01/2007 [facility] Patient Information 2012 [place].Gastrointestinal Endoscopy, Care After Refer to this sheet in the next few weeks. These instructions provide you with information on caring for yourself after your procedure. Your caregiver may also give you more specific instructions. Your treatment has been planned according to current medical practices, but problems sometimes occur. Call your caregiver if you have any problems or questions after your procedure. HOME CARE INSTRUCTIONS  If you were given medicine to help you relax (sedative), do not drive, operate machinery, or sign important documents for 24 hours.  Avoid alcohol and hot or warm beverages for the first 24 hours after the procedure.  Only take over-the-counter or prescription medicines for pain, discomfort, or fever as directed by your caregiver. You may resume taking your normal medicines unless your caregiver tells you otherwise. Ask your caregiver when you may resume taking medicines that may cause bleeding, such as aspirin, clopidogrel, or warfarin.  You may return to your normal diet and activities on the day after your procedure, or as directed by your caregiver. Walking may help to reduce any bloated feeling in your abdomen.  Drink enough fluids to keep your urine clear or pale yellow.  You may gargle with salt water if you have a sore throat. SEEK IMMEDIATE MEDICAL CARE IF:  You have severe nausea or vomiting.  You have severe abdominal pain, abdominal cramps that last longer than 6 hours, or abdominal swelling (distention).  You have severe shoulder or back pain.  You  have trouble swallowing.  You have shortness of breath, your breathing is shallow, or you are breathing faster than normal.  You have a fever or a rapid heartbeat.  You vomit blood or material that looks like coffee grounds.  You have bloody, black, or tarry stools. MAKE SURE  YOU:  Understand these instructions.  Will watch your condition.  Will get help right away if you are not doing well or get worse. Document Released: 02/19/2004 Document Revised: 11/21/2013 Document Reviewed: 10/07/2011 [facility] Patient Information 2015 [place], [place]. This information is not intended to replace advice given to you by your health care provider. Make sure you discuss any questions you have with your health care provider.

## 2014-07-12 NOTE — Anesthesia Preprocedure Evaluation (Addendum)
Anesthesia Evaluation  Patient identified by MRN, date of birth, ID band Patient awake    Reviewed: Allergy & Precautions, H&P , NPO status , Patient's Chart, lab work & pertinent test results  History of Anesthesia Complications Negative for: history of anesthetic complications (denies problems with anesthesia)  Airway Mallampati: II  TM Distance: >3 FB Neck ROM: Full    Dental no notable dental hx. (+) Dental Advisory Given, Poor Dentition   Pulmonary sleep apnea (does not wear CPAP) , pneumonia -, resolved, Current Smoker,  breath sounds clear to auscultation  Pulmonary exam normal       Cardiovascular hypertension (not on any medication at home), + Valvular Problems/Murmurs Rhythm:Regular Rate:Normal     Neuro/Psych vertigo negative psych ROS   GI/Hepatic Neg liver ROS, GERD-  Medicated and Controlled,Gastrointestinal stromal tumor   Endo/Other  negative endocrine ROS  Renal/GU negative Renal ROS  negative genitourinary   Musculoskeletal negative musculoskeletal ROS (+)   Abdominal   Peds negative pediatric ROS (+)  Hematology negative hematology ROS (+)   Anesthesia Other Findings   Reproductive/Obstetrics negative OB ROS                          Anesthesia Physical Anesthesia Plan  ASA: II  Anesthesia Plan: MAC   Post-op Pain Management:    Induction: Intravenous  Airway Management Planned: Nasal Cannula  Additional Equipment:   Intra-op Plan:   Post-operative Plan: Extubation in OR  Informed Consent: I have reviewed the patients History and Physical, chart, labs and discussed the procedure including the risks, benefits and alternatives for the proposed anesthesia with the patient or authorized representative who has indicated his/her understanding and acceptance.   Dental advisory given  Plan Discussed with: CRNA  Anesthesia Plan Comments:         Anesthesia  Quick Evaluation

## 2014-07-12 NOTE — Transfer of Care (Signed)
Immediate Anesthesia Transfer of Care Note  Patient: Timothy Hall  Procedure(s) Performed: Procedure(s): ESOPHAGEAL ENDOSCOPIC ULTRASOUND (EUS) RADIAL (N/A)  Patient Location: PACU  Anesthesia Type:MAC  Level of Consciousness: awake, alert , oriented and patient cooperative  Airway & Oxygen Therapy: Patient Spontanous Breathing and Patient connected to nasal cannula oxygen  Post-op Assessment: Report given to PACU RN, Post -op Vital signs reviewed and stable and Patient moving all extremities X 4  Post vital signs: stable  Complications: No apparent anesthesia complications

## 2014-07-12 NOTE — Op Note (Signed)
[facility] [place], [address]   ENDOSCOPY PROCEDURE REPORT  PATIENT: Timothy Hall, Timothy Hall  MR#: [id number] BIRTHDATE: Oct 02, 1955 , 55  yrs. old GENDER: male ENDOSCOPIST: [name], MD REFERRED BY:  Fanny Skates, M.D. PROCEDURE DATE:  Aug 10, 2014 PROCEDURE:  EGD, diagnostic ASA CLASS:     Class II INDICATIONS:  GIST tumor resection one year ago, evaluate for recurrence. MEDICATIONS: Monitored anesthesia care TOPICAL ANESTHETIC:  DESCRIPTION OF PROCEDURE: After the risks benefits and alternatives of the procedure were thoroughly explained, informed consent was obtained.  The diagnostic endoscope was introduced through the mouth and advanced to the second portion of the duodenum. The instrument was slowly withdrawn as the mucosa was fully examined.    Findings:  Small hiatal hernia.  Widely patent Schatzki's ring. Evidence of prior small gastric wedge resection along the body of the stomach.  Otherwise normal exam to the second portion of the duodenum, including retroflexion into the cardia.  No evidence of recurrent tumor seen in the stomach.              The scope was then withdrawn from the patient and the procedure completed.  COMPLICATIONS: There were no immediate complications.  ENDOSCOPIC IMPRESSION:     As above.  No evidence of residual gastric tumor.  RECOMMENDATIONS:     1.  Watch for potential complications of procedure. 2.  Patient to see [name] in January 2016. 3.  If no new recommendations at his upcoming appointment with [name], patient would likely benefit from colonoscopy, which can be done in [facility] or with Dr. Gala Romney. 4.  Follow-up with [facility] on as-needed basis.  eSigned:  [name], MD August 10, 2014 9:13 AM   CC:  CPT CODES: ICD CODES:  The ICD and CPT codes recommended by this software are interpretations from the data that the clinical staff has captured with the software.  The verification  of the translation of this report to the ICD and CPT codes and modifiers is the sole responsibility of the health care institution and practicing physician where this report was generated.  [employer] will not be held responsible for the validity of the ICD and CPT codes included on this report.  AMA assumes no liability for data contained or not contained herein. CPT is a Designer, television/film set of the [employer].

## 2014-07-12 NOTE — Anesthesia Postprocedure Evaluation (Signed)
  Anesthesia Post-op Note  Patient: Timothy Hall  Procedure(s) Performed: Procedure(s) (LRB): ESOPHAGEAL ENDOSCOPIC ULTRASOUND (EUS) RADIAL (N/A)  Patient Location: PACU  Anesthesia Type: MAC  Level of Consciousness: awake and alert   Airway and Oxygen Therapy: Patient Spontanous Breathing  Post-op Pain: mild  Post-op Assessment: Post-op Vital signs reviewed, Patient's Cardiovascular Status Stable, Respiratory Function Stable, Patent Airway and No signs of Nausea or vomiting  Last Vitals:  Filed Vitals:   07/12/14 0913  BP: 119/69  Pulse:   Temp:   Resp:     Post-op Vital Signs: stable   Complications: No apparent anesthesia complications

## 2014-07-12 NOTE — H&P (View-Only) (Signed)
  [facility] OFFICE PROGRESS NOTE   Diagnosis:  Gastrointestinal stromal tumor  INTERVAL HISTORY:   Mr. Carew returns as scheduled. He continues [place]. He denies nausea/vomiting. Mouth sores. No diarrhea except this morning which he relates to CT contrast. He intermittently notes bright red blood on the toilet tissue after a "hard" bowel movement. His primary doctor gave him stool cards to complete. He has not yet completed the stool cards. He has a stable rash at the upper back. He continues to have muscle cramps. No abdominal pain.  Objective:  Vital signs in last 24 hours:  Blood pressure 136/59, pulse 69, temperature 98.1 F (36.7 C), temperature source Oral, resp. rate 19, height 6' (1.829 m), weight 184 lb 1.6 oz (83.507 kg), SpO2 100 %.    HEENT: No thrush or ulcers. Resp: Lungs clear bilaterally. Cardio: Regular rate and rhythm. GI: Abdomen soft and nontender. No hepatomegaly. No mass. Vascular: No leg edema.  Skin: Faint erythematous rash at the upper back.    Lab Results:  Lab Results  Component Value Date   WBC 7.4 07/07/2014   HGB 12.8* 07/07/2014   HCT 39.4 07/07/2014   MCV 95.1 07/07/2014   PLT 280 07/07/2014   NEUTROABS 4.5 07/07/2014     Medications: I have reviewed the patient's current medications.  Assessment/Plan: 1. Gastrointestinal stromal tumor stage II (T4 NX), most likely arising from the stomach, status post surgical resection including a partial gastrectomy on 07/19/2013, 19 cm cystic mass with a low mitotic rate.  Initiation of adjuvant Gleevec 08/31/2013.   Gleevec dose reduced to 400 mg every other day beginning 10/18/2013 secondary to neutropenia.   Gleevec dose adjusted to 200 mg daily beginning 11/29/2013.   Gleevec dose adjusted to 300 mg daily beginning 12/13/2013. 2. Mild skin rash most likely secondary to [place]. 3. Mild periorbital edema secondary to [place]. 4. Severe neutropenia secondary to [facility]  10/12/2013. Resolved. 5. Mild lower extremity edema likely related to [place]. No edema on exam 07/07/2014.   Disposition: Mr. Macpherson remains in clinical remission from the gastrointestinal stromal tumor. He will continue Gleevec.  He had a CT scan this morning and has a follow-up visit with [name] in January to review the results.  He has an upcoming appointment with Dr. [name]. He will discuss the rectal bleeding with him. Of note, he has never had a screening colonoscopy.  We scheduled a return visit with labs in 6 weeks. He will contact the office in the interim with any problems.  Plan reviewed with [name].    [name] ANP/GNP-BC   07/07/2014  10:25 AM

## 2014-07-13 ENCOUNTER — Encounter (HOSPITAL_COMMUNITY): Payer: Self-pay | Admitting: Gastroenterology

## 2014-07-18 ENCOUNTER — Emergency Department (HOSPITAL_COMMUNITY): Payer: BC Managed Care – PPO

## 2014-07-18 ENCOUNTER — Inpatient Hospital Stay (HOSPITAL_COMMUNITY)
Admission: EM | Admit: 2014-07-18 | Discharge: 2014-07-23 | DRG: 389 | Disposition: A | Discharge status: Home / Self Care | Payer: BC Managed Care – PPO | Attending: Internal Medicine | Admitting: Internal Medicine

## 2014-07-18 ENCOUNTER — Encounter (HOSPITAL_COMMUNITY): Payer: Self-pay | Admitting: *Deleted

## 2014-07-18 DIAGNOSIS — Z888 Allergy status to other drugs, medicaments and biological substances status: Secondary | ICD-10-CM

## 2014-07-18 DIAGNOSIS — Z8249 Family history of ischemic heart disease and other diseases of the circulatory system: Secondary | ICD-10-CM

## 2014-07-18 DIAGNOSIS — G4733 Obstructive sleep apnea (adult) (pediatric): Secondary | ICD-10-CM | POA: Diagnosis present

## 2014-07-18 DIAGNOSIS — K5660 Unspecified intestinal obstruction: Secondary | ICD-10-CM | POA: Diagnosis not present

## 2014-07-18 DIAGNOSIS — K219 Gastro-esophageal reflux disease without esophagitis: Secondary | ICD-10-CM | POA: Diagnosis present

## 2014-07-18 DIAGNOSIS — R112 Nausea with vomiting, unspecified: Secondary | ICD-10-CM | POA: Diagnosis not present

## 2014-07-18 DIAGNOSIS — Z79899 Other long term (current) drug therapy: Secondary | ICD-10-CM

## 2014-07-18 DIAGNOSIS — R109 Unspecified abdominal pain: Secondary | ICD-10-CM

## 2014-07-18 DIAGNOSIS — Z803 Family history of malignant neoplasm of breast: Secondary | ICD-10-CM

## 2014-07-18 DIAGNOSIS — R030 Elevated blood-pressure reading, without diagnosis of hypertension: Secondary | ICD-10-CM | POA: Diagnosis present

## 2014-07-18 DIAGNOSIS — Z8701 Personal history of pneumonia (recurrent): Secondary | ICD-10-CM

## 2014-07-18 DIAGNOSIS — F1021 Alcohol dependence, in remission: Secondary | ICD-10-CM | POA: Diagnosis present

## 2014-07-18 DIAGNOSIS — K920 Hematemesis: Secondary | ICD-10-CM | POA: Diagnosis present

## 2014-07-18 DIAGNOSIS — F17210 Nicotine dependence, cigarettes, uncomplicated: Secondary | ICD-10-CM | POA: Diagnosis present

## 2014-07-18 DIAGNOSIS — D490 Neoplasm of unspecified behavior of digestive system: Secondary | ICD-10-CM | POA: Diagnosis present

## 2014-07-18 DIAGNOSIS — K56609 Unspecified intestinal obstruction, unspecified as to partial versus complete obstruction: Secondary | ICD-10-CM

## 2014-07-18 DIAGNOSIS — Z903 Acquired absence of stomach [part of]: Secondary | ICD-10-CM | POA: Diagnosis present

## 2014-07-18 LAB — CBC WITH DIFFERENTIAL/PLATELET
BASOS ABS: 0 10*3/uL (ref 0.0–0.1)
BASOS PCT: 0 % (ref 0–1)
EOS ABS: 0.1 10*3/uL (ref 0.0–0.7)
Eosinophils Relative: 1 % (ref 0–5)
HCT: 43.4 % (ref 39.0–52.0)
Hemoglobin: 14.1 g/dL (ref 13.0–17.0)
Lymphocytes Relative: 11 % — ABNORMAL LOW (ref 12–46)
Lymphs Abs: 1.4 10*3/uL (ref 0.7–4.0)
MCH: 30.9 pg (ref 26.0–34.0)
MCHC: 32.5 g/dL (ref 30.0–36.0)
MCV: 95 fL (ref 78.0–100.0)
Monocytes Absolute: 0.5 10*3/uL (ref 0.1–1.0)
Monocytes Relative: 4 % (ref 3–12)
NEUTROS PCT: 84 % — AB (ref 43–77)
Neutro Abs: 10.4 10*3/uL — ABNORMAL HIGH (ref 1.7–7.7)
Platelets: 307 10*3/uL (ref 150–400)
RBC: 4.57 MIL/uL (ref 4.22–5.81)
RDW: 13.2 % (ref 11.5–15.5)
WBC: 12.3 10*3/uL — ABNORMAL HIGH (ref 4.0–10.5)

## 2014-07-18 LAB — COMPREHENSIVE METABOLIC PANEL
ALBUMIN: 4.4 g/dL (ref 3.5–5.2)
ALK PHOS: 83 U/L (ref 39–117)
ALT: 27 U/L (ref 0–53)
ANION GAP: 8 (ref 5–15)
AST: 24 U/L (ref 0–37)
BUN: 17 mg/dL (ref 6–23)
CO2: 26 mmol/L (ref 19–32)
Calcium: 9.2 mg/dL (ref 8.4–10.5)
Chloride: 104 mEq/L (ref 96–112)
Creatinine, Ser: 1.06 mg/dL (ref 0.50–1.35)
GFR calc Af Amer: 88 mL/min — ABNORMAL LOW (ref >90)
GFR calc non Af Amer: 76 mL/min — ABNORMAL LOW (ref >90)
Glucose, Bld: 121 mg/dL — ABNORMAL HIGH (ref 70–99)
POTASSIUM: 4.3 mmol/L (ref 3.5–5.1)
SODIUM: 138 mmol/L (ref 135–145)
TOTAL PROTEIN: 7.5 g/dL (ref 6.0–8.3)
Total Bilirubin: 0.6 mg/dL (ref 0.3–1.2)

## 2014-07-18 LAB — PROTIME-INR
INR: 0.96 (ref 0.00–1.49)
PROTHROMBIN TIME: 12.9 s (ref 11.6–15.2)

## 2014-07-18 LAB — LIPASE, BLOOD: Lipase: 36 U/L (ref 11–59)

## 2014-07-18 MED ORDER — IOHEXOL 300 MG/ML  SOLN
100.0000 mL | Freq: Once | INTRAMUSCULAR | Status: AC | PRN
  Administered 2014-07-18: 100 mL via INTRAVENOUS

## 2014-07-18 MED ORDER — PANTOPRAZOLE SODIUM 40 MG IV SOLR
40.0000 mg | Freq: Once | INTRAVENOUS | Status: AC
  Administered 2014-07-18: 40 mg via INTRAVENOUS
  Filled 2014-07-18: qty 40

## 2014-07-18 NOTE — ED Notes (Signed)
Pt reports sudden on set of n/v today which started at 1430.  Pt reports vomiting x 3, reports vomiting blood as well.  Pt reports mid-abd pain.  Pt reports he takes 2 chemo pills daily.  Pt's wife reports pt's temp is normally 7 -had a low grade fever of 99.9 today.  Pt reports feeling chills and weak as well.

## 2014-07-18 NOTE — ED Provider Notes (Signed)
CSN: [id number]     Arrival date & time 07/18/14  2143 History   First MD Initiated Contact with Patient 07/18/14 2153     Chief Complaint  Patient presents with  . Emesis  . Hematemesis     (Consider location/radiation/quality/duration/timing/severity/associated sxs/prior Treatment) Patient is a 58 y.o. male presenting with vomiting. The history is provided by the patient.  Emesis Severity:  Moderate Timing:  Constant Quality:  Stomach contents Progression:  Unchanged Recent urination:  Normal Relieved by:  Nothing Worsened by:  Nothing tried Ineffective treatments:  None tried Associated symptoms: abdominal pain   Associated symptoms: no chills, no fever and no URI     Past Medical History  Diagnosis Date  . GERD (gastroesophageal reflux disease)   . Vertigo   . Sleep apnea     Intolerant of CPAP  . History of alcoholism     Quit drinking in 1985  . Gastric neoplasm     Cystic, 18 cm - recently diagnosed October 2014   . Pneumonia age 26    hx of  . Heart murmur age 33  . Cancer   . Complication of anesthesia     slow to awaken  . Rash     upper back due to gleevac   Past Surgical History  Procedure Laterality Date  . Tumor removal      Benign tumor removed from left breast and back area.   . Eus N/A 05/25/2013    Procedure: ESOPHAGEAL ENDOSCOPIC ULTRASOUND (EUS) RADIAL;  Surgeon: [name], MD;  Location: [facility];  Service: Endoscopy;  Laterality: N/A;  . Fine needle aspiration N/A 05/25/2013    Procedure: FINE NEEDLE ASPIRATION (FNA) LINEAR;  Surgeon: [name], MD;  Location: [facility];  Service: Endoscopy;  Laterality: N/A;"this part never happend"  . Laparoscopic gastrectomy N/A 07/19/2013    Procedure: RESECTION OF RETROGASTRIC CYSTIC NEOPLASM WITH PARTIAL GASTRECTOMY;  Surgeon: [name], MD;  Location: [facility]S;  Service: General;  Laterality: N/A;  . Lipoma removed from back  yrs ago  . Breast tumor removed Right 1976  . Eus N/A  07/12/2014    Procedure: ESOPHAGEAL ENDOSCOPIC ULTRASOUND (EUS) RADIAL;  Surgeon: [name], MD;  Location: [facility];  Service: Endoscopy;  Laterality: N/A;   Family History  Problem Relation Age of Onset  . Colon cancer Neg Hx   . Stomach cancer Neg Hx   . Breast cancer Mother   . CAD Brother     Premature disease   History  Substance Use Topics  . Smoking status: Current Every Day Smoker -- 0.50 packs/day for 40 years    Types: Cigarettes  . Smokeless tobacco: Never Used     Comment: 1 to 1.5 packs per day- not currently smoking; trying to quit  . Alcohol Use: No     Comment: quit in 1985    Review of Systems  Constitutional: Negative for fever and chills.  Respiratory: Negative for cough and shortness of breath.   Gastrointestinal: Positive for nausea, vomiting and abdominal pain.  All other systems reviewed and are negative.     Allergies  Nicoderm  Home Medications   Prior to Admission medications   Medication Sig Start Date End Date Taking? Authorizing Provider  imatinib (GLEEVEC) 100 MG tablet Take 3 tablets (300 mg total) by mouth daily. Take with meals and large glass of water.Caution:Chemotherapy 03/31/14  Yes Ladell Pier, MD  omeprazole (PRILOSEC) 20 MG capsule Take 20 mg by mouth daily.  05/16/13  Yes Orvil Feil, NP   BP 155/70 mmHg  Pulse 85  Temp(Src) 98.6 F (37 C) (Oral)  Resp 18  SpO2 98% Physical Exam  Constitutional: He is oriented to person, place, and time. He appears well-developed and well-nourished. No distress.  HENT:  Head: Normocephalic and atraumatic.  Mouth/Throat: No oropharyngeal exudate.  Eyes: EOM are normal. Pupils are equal, round, and reactive to light.  Neck: Normal range of motion. Neck supple.  Cardiovascular: Normal rate and regular rhythm.  Exam reveals no friction rub.   No murmur heard. Pulmonary/Chest: Effort normal and breath sounds normal. No respiratory distress. He has no wheezes. He has no rales.   Abdominal: Soft. He exhibits no distension. There is no tenderness. There is no rebound.  Musculoskeletal: Normal range of motion. He exhibits no edema.  Neurological: He is alert and oriented to person, place, and time.  Skin: No rash noted. He is not diaphoretic.  Nursing note and vitals reviewed.   ED Course  Procedures (including critical care time) Labs Review Labs Reviewed  CBC WITH DIFFERENTIAL  COMPREHENSIVE METABOLIC PANEL  LIPASE, BLOOD    Imaging Review Ct Abdomen Pelvis W Contrast  07/19/2014   CLINICAL DATA:  Sudden onset nausea and vomiting  EXAM: CT ABDOMEN AND PELVIS WITH CONTRAST  TECHNIQUE: Multidetector CT imaging of the abdomen and pelvis was performed using the standard protocol following bolus administration of intravenous contrast.  CONTRAST:  148mL OMNIPAQUE IOHEXOL 300 MG/ML  SOLN  COMPARISON:  07/07/2014  FINDINGS: BODY WALL: Status post right chest wall surgery, partially visualized.  LOWER CHEST: Unremarkable.  ABDOMEN/PELVIS:  Liver: No metastatic focus identified.  Biliary: No evidence of biliary obstruction or stone.  Pancreas: Unremarkable.  Spleen: Unremarkable.  Adrenals: Unremarkable.  Kidneys and ureters: Presumed sub cm cysts in the left upper pole. No hydronephrosis.  Bladder: Unremarkable.  Reproductive: Unremarkable.  Bowel: There is bowel dilatation and fluid filling above a transition point in the central abdomen on image 59. The more distal bowel is decompressed. This is in a region of previously described fat lesion with peripheral soft tissue rim, measuring up to 8 cm. As previously suggested, this structure favors a omental infarct. Status post resection of a gastric GIST. No abnormal soft tissue thickening in the region of the suture line. No evidence of bowel perforation or ischemia.  Retroperitoneum: No mass or adenopathy.  Peritoneum: No ascites or pneumoperitoneum.  Vascular: No acute abnormality.  OSSEOUS: No acute abnormalities.  IMPRESSION:  High-grade small bowel obstruction. The transition point is near a probable omental infarct, suspect adhesion or internal hernia.   Electronically Signed   By: Jorje Guild M.D.   On: 07/19/2014 00:14     EKG Interpretation None      MDM   Final diagnoses:  Abdominal pain  Small bowel obstruction    11M here with abdominal pain. Severe, epigastric. Began today. Had some vomiting, started as stomach contents, progressed to frank blood.  Hx of GIST tumor. No hx of hematemesis or abdominal pain with his prior GIST tumor. This was removed last year. AFVSS here. Epigastric and severe L sided abdominal pain on exam with guarding.  CT shows high grade SBO. Dr. Marlou Starks with surgery consulted, will see patient in the am.  Patient admitted to hospitalists.  I have reviewed all labs and imaging and considered them in my medical decision making.    Evelina Bucy, MD 07/19/14 [phone]

## 2014-07-19 ENCOUNTER — Encounter (HOSPITAL_COMMUNITY): Payer: Self-pay | Admitting: Internal Medicine

## 2014-07-19 DIAGNOSIS — R030 Elevated blood-pressure reading, without diagnosis of hypertension: Secondary | ICD-10-CM | POA: Diagnosis present

## 2014-07-19 DIAGNOSIS — R110 Nausea: Secondary | ICD-10-CM

## 2014-07-19 DIAGNOSIS — Z8249 Family history of ischemic heart disease and other diseases of the circulatory system: Secondary | ICD-10-CM | POA: Diagnosis not present

## 2014-07-19 DIAGNOSIS — K219 Gastro-esophageal reflux disease without esophagitis: Secondary | ICD-10-CM | POA: Diagnosis present

## 2014-07-19 DIAGNOSIS — Z8701 Personal history of pneumonia (recurrent): Secondary | ICD-10-CM | POA: Diagnosis not present

## 2014-07-19 DIAGNOSIS — Z79899 Other long term (current) drug therapy: Secondary | ICD-10-CM | POA: Diagnosis not present

## 2014-07-19 DIAGNOSIS — K56609 Unspecified intestinal obstruction, unspecified as to partial versus complete obstruction: Secondary | ICD-10-CM | POA: Diagnosis present

## 2014-07-19 DIAGNOSIS — K920 Hematemesis: Secondary | ICD-10-CM | POA: Diagnosis present

## 2014-07-19 DIAGNOSIS — Z803 Family history of malignant neoplasm of breast: Secondary | ICD-10-CM | POA: Diagnosis not present

## 2014-07-19 DIAGNOSIS — R112 Nausea with vomiting, unspecified: Secondary | ICD-10-CM | POA: Diagnosis present

## 2014-07-19 DIAGNOSIS — F17210 Nicotine dependence, cigarettes, uncomplicated: Secondary | ICD-10-CM | POA: Diagnosis present

## 2014-07-19 DIAGNOSIS — K5669 Other intestinal obstruction: Secondary | ICD-10-CM

## 2014-07-19 DIAGNOSIS — G4733 Obstructive sleep apnea (adult) (pediatric): Secondary | ICD-10-CM | POA: Diagnosis present

## 2014-07-19 DIAGNOSIS — F1021 Alcohol dependence, in remission: Secondary | ICD-10-CM | POA: Diagnosis present

## 2014-07-19 DIAGNOSIS — K5660 Unspecified intestinal obstruction: Secondary | ICD-10-CM | POA: Diagnosis present

## 2014-07-19 DIAGNOSIS — Z888 Allergy status to other drugs, medicaments and biological substances status: Secondary | ICD-10-CM | POA: Diagnosis not present

## 2014-07-19 DIAGNOSIS — Z903 Acquired absence of stomach [part of]: Secondary | ICD-10-CM | POA: Diagnosis present

## 2014-07-19 LAB — CBC
HCT: 41.3 % (ref 39.0–52.0)
HEMATOCRIT: 39.6 % (ref 39.0–52.0)
HEMATOCRIT: 40.4 % (ref 39.0–52.0)
Hemoglobin: 13.4 g/dL (ref 13.0–17.0)
Hemoglobin: 13 g/dL (ref 13.0–17.0)
Hemoglobin: 12.9 g/dL — ABNORMAL LOW (ref 13.0–17.0)
MCH: 30.8 pg (ref 26.0–34.0)
MCH: 31.3 pg (ref 26.0–34.0)
MCH: 30.6 pg (ref 26.0–34.0)
MCHC: 32.4 g/dL (ref 30.0–36.0)
MCHC: 32.8 g/dL (ref 30.0–36.0)
MCHC: 31.9 g/dL (ref 30.0–36.0)
MCV: 94.9 fL (ref 78.0–100.0)
MCV: 95.2 fL (ref 78.0–100.0)
MCV: 95.7 fL (ref 78.0–100.0)
Platelets: 292 10*3/uL (ref 150–400)
Platelets: 275 10*3/uL (ref 150–400)
Platelets: 277 10*3/uL (ref 150–400)
RBC: 4.35 MIL/uL (ref 4.22–5.81)
RBC: 4.16 MIL/uL — AB (ref 4.22–5.81)
RBC: 4.22 MIL/uL (ref 4.22–5.81)
RDW: 13.3 % (ref 11.5–15.5)
RDW: 13.4 % (ref 11.5–15.5)
RDW: 13.5 % (ref 11.5–15.5)
WBC: 10.6 10*3/uL — ABNORMAL HIGH (ref 4.0–10.5)
WBC: 9.7 10*3/uL (ref 4.0–10.5)
WBC: 9.3 10*3/uL (ref 4.0–10.5)

## 2014-07-19 LAB — TYPE AND SCREEN
ABO/RH(D): O POS
Antibody Screen: NEGATIVE

## 2014-07-19 LAB — GLUCOSE, CAPILLARY
Glucose-Capillary: 110 mg/dL — ABNORMAL HIGH (ref 70–99)
Glucose-Capillary: 114 mg/dL — ABNORMAL HIGH (ref 70–99)
Glucose-Capillary: 126 mg/dL — ABNORMAL HIGH (ref 70–99)
Glucose-Capillary: 138 mg/dL — ABNORMAL HIGH (ref 70–99)

## 2014-07-19 LAB — COMPREHENSIVE METABOLIC PANEL
ALBUMIN: 4.1 g/dL (ref 3.5–5.2)
ALT: 25 U/L (ref 0–53)
ANION GAP: 11 (ref 5–15)
AST: 22 U/L (ref 0–37)
Alkaline Phosphatase: 77 U/L (ref 39–117)
BUN: 17 mg/dL (ref 6–23)
CO2: 26 mmol/L (ref 19–32)
Calcium: 8.6 mg/dL (ref 8.4–10.5)
Chloride: 97 mEq/L (ref 96–112)
Creatinine, Ser: 1.08 mg/dL (ref 0.50–1.35)
GFR calc Af Amer: 86 mL/min — ABNORMAL LOW (ref >90)
GFR calc non Af Amer: 74 mL/min — ABNORMAL LOW (ref >90)
GLUCOSE: 135 mg/dL — AB (ref 70–99)
POTASSIUM: 3.8 mmol/L (ref 3.5–5.1)
Sodium: 134 mmol/L — ABNORMAL LOW (ref 135–145)
Total Bilirubin: 0.5 mg/dL (ref 0.3–1.2)
Total Protein: 7.1 g/dL (ref 6.0–8.3)

## 2014-07-19 LAB — LACTIC ACID, PLASMA: LACTIC ACID, VENOUS: 0.9 mmol/L (ref 0.5–2.2)

## 2014-07-19 MED ORDER — ACETAMINOPHEN 650 MG RE SUPP: 650.0000 mg | Freq: Four times a day (QID) | RECTAL | Status: DC | PRN

## 2014-07-19 MED ORDER — DEXTROSE-NACL 5-0.9 % IV SOLN
INTRAVENOUS | Status: AC
  Administered 2014-07-19 – 2014-07-20 (×3): via INTRAVENOUS

## 2014-07-19 MED ORDER — LIDOCAINE HCL 2 % EX GEL
CUTANEOUS | Status: AC
  Administered 2014-07-19: 10
  Filled 2014-07-19: qty 10

## 2014-07-19 MED ORDER — PANTOPRAZOLE SODIUM 40 MG IV SOLR
40.0000 mg | Freq: Two times a day (BID) | INTRAVENOUS | Status: DC
  Administered 2014-07-19 – 2014-07-23 (×10): 40 mg via INTRAVENOUS
  Filled 2014-07-19 (×11): qty 40

## 2014-07-19 MED ORDER — CHLORHEXIDINE GLUCONATE 0.12 % MT SOLN
15.0000 mL | Freq: Two times a day (BID) | OROMUCOSAL | Status: DC
  Administered 2014-07-19 – 2014-07-23 (×7): 15 mL via OROMUCOSAL
  Filled 2014-07-19 (×11): qty 15

## 2014-07-19 MED ORDER — ACETAMINOPHEN 325 MG PO TABS
650.0000 mg | ORAL_TABLET | Freq: Four times a day (QID) | ORAL | Status: DC | PRN
  Administered 2014-07-19: 650 mg via ORAL
  Filled 2014-07-19: qty 2

## 2014-07-19 MED ORDER — CETYLPYRIDINIUM CHLORIDE 0.05 % MT LIQD
7.0000 mL | Freq: Two times a day (BID) | OROMUCOSAL | Status: DC
  Administered 2014-07-19 – 2014-07-20 (×2): 7 mL via OROMUCOSAL

## 2014-07-19 MED ORDER — ENOXAPARIN SODIUM 40 MG/0.4ML ~~LOC~~ SOLN
40.0000 mg | SUBCUTANEOUS | Status: DC
  Filled 2014-07-19: qty 0.4

## 2014-07-19 MED ORDER — HYDRALAZINE HCL 20 MG/ML IJ SOLN: 10.0000 mg | INTRAMUSCULAR | Status: DC | PRN

## 2014-07-19 MED ORDER — ONDANSETRON HCL 4 MG/2ML IJ SOLN
4.0000 mg | Freq: Four times a day (QID) | INTRAMUSCULAR | Status: DC | PRN
  Administered 2014-07-23: 4 mg via INTRAVENOUS
  Filled 2014-07-19: qty 2

## 2014-07-19 MED ORDER — MORPHINE SULFATE 2 MG/ML IJ SOLN
1.0000 mg | INTRAMUSCULAR | Status: DC | PRN
  Administered 2014-07-23 (×2): 1 mg via INTRAVENOUS
  Filled 2014-07-19 (×3): qty 1

## 2014-07-19 MED ORDER — ONDANSETRON HCL 4 MG PO TABS: 4.0000 mg | ORAL_TABLET | Freq: Four times a day (QID) | ORAL | Status: DC | PRN

## 2014-07-19 NOTE — H&P (Signed)
[facility] History and Physical  Timothy Hall KDT:[id number] DOB: 08-04-1955 DOA: 07/18/2014  Referring physician: ER physician. PCP: Timothy Mustache, MD   Chief Complaint: Nausea vomiting abdominal pain.  HPI: Timothy Hall is a 58 y.o. male with history of Gist tumor who has had surgery last year presents to the ER because of nausea and vomiting with abdominal pain. Patient states he started having abdominal pain since yesterday morning. And by afternoon patient started developing nausea vomiting. Eventually patient started having hematemesis. Denies any chest pain or shortness of breath. Abdominal pain is constant. In the ER CT abdomen and pelvis shows features concerning for small bowel obstruction and on call surgeon has been consulted. Patient is about to be placed on NG tube suction. Patient's last bowel movement was 2 days ago and has not had any flatus for last 24 hours. Patient has had a recent EGD by his gastroenterologist Dr. [name].   Review of Systems: As presented in the history of presenting illness, rest negative.  Past Medical History  Diagnosis Date  . GERD (gastroesophageal reflux disease)   . Vertigo   . Sleep apnea     Intolerant of CPAP  . History of alcoholism     Quit drinking in 1985  . Gastric neoplasm     Cystic, 18 cm - recently diagnosed October 2014   . Pneumonia age 74    hx of  . Heart murmur age 59  . Cancer   . Complication of anesthesia     slow to awaken  . Rash     upper back due to gleevac   Past Surgical History  Procedure Laterality Date  . Tumor removal      Benign tumor removed from left breast and back area.   . Eus N/A 05/25/2013    Procedure: ESOPHAGEAL ENDOSCOPIC ULTRASOUND (EUS) RADIAL;  Surgeon: [name], MD;  Location: [facility];  Service: Endoscopy;  Laterality: N/A;  . Fine needle aspiration N/A 05/25/2013    Procedure: FINE NEEDLE ASPIRATION (FNA) LINEAR;  Surgeon: [name], MD;  Location: [facility];  Service: Endoscopy;  Laterality: N/A;"this part never happend"  . Laparoscopic gastrectomy N/A 07/19/2013    Procedure: RESECTION OF RETROGASTRIC CYSTIC NEOPLASM WITH PARTIAL GASTRECTOMY;  Surgeon: [name], MD;  Location: [facility];  Service: General;  Laterality: N/A;  . Lipoma removed from back  yrs ago  . Breast tumor removed Right 1976  . Eus N/A 07/12/2014    Procedure: ESOPHAGEAL ENDOSCOPIC ULTRASOUND (EUS) RADIAL;  Surgeon: [name], MD;  Location: [facility];  Service: Endoscopy;  Laterality: N/A;   Social History:  reports that he has been smoking Cigarettes.  He has a 20 pack-year smoking history. He has never used smokeless tobacco. He reports that he does not drink alcohol or use illicit drugs. Where does patient live home. Can patient participate in ADLs? Yes.  Allergies  Allergen Reactions  . Nicoderm [Nicotine] Rash    Family History:  Family History  Problem Relation Age of Onset  . Colon cancer Neg Hx   . Stomach cancer Neg Hx   . Breast cancer Mother   . CAD Brother     Premature disease      Prior to Admission medications   Medication Sig Start Date End Date Taking? Authorizing Provider  imatinib (GLEEVEC) 100 MG tablet Take 3 tablets (300 mg total) by mouth daily. Take with meals and large glass of water.Caution:Chemotherapy 03/31/14  Yes Ladell Pier,  MD  omeprazole (PRILOSEC) 20 MG capsule Take 20 mg by mouth daily.  05/16/13  Yes Orvil Feil, NP    Physical Exam: Filed Vitals:   07/18/14 2148 07/18/14 2158 07/19/14 0046  BP: 161/83 155/70 140/84  Pulse: 105 85 92  Temp: 98.7 F (37.1 C) 98.6 F (37 C)   TempSrc: Oral Oral   Resp: 20 18 16   SpO2: 97% 98% 99%     General:  Well-developed and nourished.  Eyes: Anicteric no pallor.  ENT: No discharge from the ears eyes nose or mouth.  Neck: No mass felt.  Cardiovascular: S1-S2 heard.  Respiratory: No rhonchi or crepitations.  Abdomen: Distended bowel sounds not  appreciated nontender no guarding or rigidity.  Skin: No rash.  Musculoskeletal: No edema.  Psychiatric: Appears normal.  Neurologic: Alert awake oriented to time place and person. Moves all extremities.  Labs on Admission:  Basic Metabolic Panel:  Recent Labs Lab 07/18/14 2300  NA 138  K 4.3  CL 104  CO2 26  GLUCOSE 121*  BUN 17  CREATININE 1.06  CALCIUM 9.2   Liver Function Tests:  Recent Labs Lab 07/18/14 2300  AST 24  ALT 27  ALKPHOS 83  BILITOT 0.6  PROT 7.5  ALBUMIN 4.4    Recent Labs Lab 07/18/14 2300  LIPASE 36   No results for input(s): AMMONIA in the last 168 hours. CBC:  Recent Labs Lab 07/18/14 2300  WBC 12.3*  NEUTROABS 10.4*  HGB 14.1  HCT 43.4  MCV 95.0  PLT 307   Cardiac Enzymes: No results for input(s): CKTOTAL, CKMB, CKMBINDEX, TROPONINI in the last 168 hours.  BNP (last 3 results) No results for input(s): PROBNP in the last 8760 hours. CBG: No results for input(s): GLUCAP in the last 168 hours.  Radiological Exams on Admission: Ct Abdomen Pelvis W Contrast  07/19/2014   CLINICAL DATA:  Sudden onset nausea and vomiting  EXAM: CT ABDOMEN AND PELVIS WITH CONTRAST  TECHNIQUE: Multidetector CT imaging of the abdomen and pelvis was performed using the standard protocol following bolus administration of intravenous contrast.  CONTRAST:  110mL OMNIPAQUE IOHEXOL 300 MG/ML  SOLN  COMPARISON:  07/07/2014  FINDINGS: BODY WALL: Status post right chest wall surgery, partially visualized.  LOWER CHEST: Unremarkable.  ABDOMEN/PELVIS:  Liver: No metastatic focus identified.  Biliary: No evidence of biliary obstruction or stone.  Pancreas: Unremarkable.  Spleen: Unremarkable.  Adrenals: Unremarkable.  Kidneys and ureters: Presumed sub cm cysts in the left upper pole. No hydronephrosis.  Bladder: Unremarkable.  Reproductive: Unremarkable.  Bowel: There is bowel dilatation and fluid filling above a transition point in the central abdomen on image 59.  The more distal bowel is decompressed. This is in a region of previously described fat lesion with peripheral soft tissue rim, measuring up to 8 cm. As previously suggested, this structure favors a omental infarct. Status post resection of a gastric GIST. No abnormal soft tissue thickening in the region of the suture line. No evidence of bowel perforation or ischemia.  Retroperitoneum: No mass or adenopathy.  Peritoneum: No ascites or pneumoperitoneum.  Vascular: No acute abnormality.  OSSEOUS: No acute abnormalities.  IMPRESSION: High-grade small bowel obstruction. The transition point is near a probable omental infarct, suspect adhesion or internal hernia.   Electronically Signed   By: Jorje Guild M.D.   On: 07/19/2014 00:14     Assessment/Plan Principal Problem:   SBO (small bowel obstruction) Active Problems:   Gastric neoplasm s/p partial gastrectomy 07/19/2013  Hematemesis   1. Small bowel obstruction - on-call surgeon has been consulted. Patient will be kept nothing by mouth. NG tube suction has been ordered and is about to be placed. Gentle hydration pain relief medications and further recommendations per surgeon. 2. Hematemesis - closely follow CBC. Patient has had recent EGD which was unremarkable. Patient has been placed on Protonix IV. 3. Elevated blood pressure - closely follow blood pressure trends. When necessary IV hydralazine for systolic blood pressure more than 160. 4. History of OSA - patient states he does not use his CPAP. 5. Gist tumor status post resection - per oncology surgery and GI.   DVT Prophylaxis on SCDs since patient has hematemesis we will avoid Lovenox.  Code Status: Full code.  Family Communication: Patient's wife at the bedside.  Disposition Plan: Admit to inpatient.    Thadius Smisek N. [facility] Pager [phone].  If 7PM-7AM, please contact night-coverage [email or web address] Password [email or web address] 07/19/2014, 2:09 AM

## 2014-07-19 NOTE — Progress Notes (Signed)
Subjective: He says his abdomen isn't like a board since NG place, no flatus, still distended..  This is his first problem since surgery.  NG working.  [place] 1/2 full.  Objective: Vital signs in last 24 hours: Temp:  [98 F (36.7 C)-98.7 F (37.1 C)] 98 F (36.7 C) (12/30 0617) Pulse Rate:  [85-105] 90 (12/30 0617) Resp:  [16-20] 16 (12/30 0617) BP: (138-163)/(69-84) 138/69 mmHg (12/30 0617) SpO2:  [97 %-99 %] 97 % (12/30 0617) Weight:  [83.1 kg (183 lb 3.2 oz)] 83.1 kg (183 lb 3.2 oz) (12/30 0217) Last BM Date: 07/18/14   from the NG last shift Afebrile VSS Labs OK No film this AM Intake/Output from previous day: 12/29 0701 - 12/30 0700 In: -  Out: 400 [Emesis/NG output:400] Intake/Output this shift:    General appearance: alert, cooperative and no distress GI: soft, but somewhat distended, few hyperactive bowel sounds.    Lab Results:   Recent Labs  07/19/14 0312 07/19/14 0625  WBC 10.6* 9.7  HGB 13.4 13.0  HCT 41.3 39.6  PLT 292 275    BMET  Recent Labs  07/18/14 2300 07/19/14 0312  NA 138 134*  K 4.3 3.8  CL 104 97  CO2 26 26  GLUCOSE 121* 135*  BUN 17 17  CREATININE 1.06 1.08  CALCIUM 9.2 8.6   PT/INR  Recent Labs  07/18/14 2300  LABPROT 12.9  INR 0.96     Recent Labs Lab 07/18/14 2300 07/19/14 0312  AST 24 22  ALT 27 25  ALKPHOS 83 77  BILITOT 0.6 0.5  PROT 7.5 7.1  ALBUMIN 4.4 4.1     Lipase     Component Value Date/Time   LIPASE 36 07/18/2014 2300     Studies/Results: Ct Abdomen Pelvis W Contrast  07/19/2014   CLINICAL DATA:  Sudden onset nausea and vomiting  EXAM: CT ABDOMEN AND PELVIS WITH CONTRAST  TECHNIQUE: Multidetector CT imaging of the abdomen and pelvis was performed using the standard protocol following bolus administration of intravenous contrast.  CONTRAST:  147mL OMNIPAQUE IOHEXOL 300 MG/ML  SOLN  COMPARISON:  07/07/2014  FINDINGS: BODY WALL: Status post right chest wall surgery, partially visualized.   LOWER CHEST: Unremarkable.  ABDOMEN/PELVIS:  Liver: No metastatic focus identified.  Biliary: No evidence of biliary obstruction or stone.  Pancreas: Unremarkable.  Spleen: Unremarkable.  Adrenals: Unremarkable.  Kidneys and ureters: Presumed sub cm cysts in the left upper pole. No hydronephrosis.  Bladder: Unremarkable.  Reproductive: Unremarkable.  Bowel: There is bowel dilatation and fluid filling above a transition point in the central abdomen on image 59. The more distal bowel is decompressed. This is in a region of previously described fat lesion with peripheral soft tissue rim, measuring up to 8 cm. As previously suggested, this structure favors a omental infarct. Status post resection of a gastric GIST. No abnormal soft tissue thickening in the region of the suture line. No evidence of bowel perforation or ischemia.  Retroperitoneum: No mass or adenopathy.  Peritoneum: No ascites or pneumoperitoneum.  Vascular: No acute abnormality.  OSSEOUS: No acute abnormalities.  IMPRESSION: High-grade small bowel obstruction. The transition point is near a probable omental infarct, suspect adhesion or internal hernia.   Electronically Signed   By: Jorje Guild M.D.   On: 07/19/2014 00:14    Medications: . antiseptic oral rinse  7 mL Mouth Rinse q12n4p  . chlorhexidine  15 mL Mouth Rinse BID  . pantoprazole (PROTONIX) IV  40 mg Intravenous Q12H  Assessment/Plan SBO Hx of GIST tumor; s/p RESECTION OF RETROGASTRIC CYSTIC NEOPLASM WITH PARTIAL GASTRECTOMY; Surgeon: [name], 07/19/13.   Hx of pneumonia Sleep apnea CPAP Intolerant Hx of ETOH/none since 1985 Hx of GERD Hx of vertigo Lovenox for DVT prophylaxis   Plan:  Mobilize, continue bowel rest, hydration, NG.  Film in AM. Recheck BMP AM.   LOS: 1 day    Brandi Tomlinson 07/19/2014

## 2014-07-19 NOTE — ED Notes (Signed)
Dr. Kakrakandy at bedside. 

## 2014-07-19 NOTE — Progress Notes (Signed)
[place] for Lovenox Indication: VTE Prophylaxis  Allergies  Allergen Reactions  . Nicoderm [Nicotine] Rash    Patient Measurements: Height: 6' (182.9 cm) Weight: 183 lb 3.2 oz (83.1 kg) IBW/kg (Calculated) : 77.6   Vital Signs: Temp: 98 F (36.7 C) (12/30 0617) Temp Source: Oral (12/30 0617) BP: 138/69 mmHg (12/30 0617) Pulse Rate: 90 (12/30 0617)  Labs:  Recent Labs  07/18/14 2300 07/19/14 0312 07/19/14 0625 07/19/14 1010  HGB 14.1 13.4 13.0 12.9*  HCT 43.4 41.3 39.6 40.4  PLT 307 292 275 277  LABPROT 12.9  --   --   --   INR 0.96  --   --   --   CREATININE 1.06 1.08  --   --     Estimated Creatinine Clearance: 82.8 mL/min (by C-G formula based on Cr of 1.08).   Medical History: Past Medical History  Diagnosis Date  . GERD (gastroesophageal reflux disease)   . Vertigo   . Sleep apnea     Intolerant of CPAP  . History of alcoholism     Quit drinking in 1985  . Gastric neoplasm     Cystic, 18 cm - recently diagnosed October 2014   . Pneumonia age 43    hx of  . Heart murmur age 64  . Cancer   . Complication of anesthesia     slow to awaken  . Rash     upper back due to gleevac    Medications:  Scheduled:  . antiseptic oral rinse  7 mL Mouth Rinse q12n4p  . chlorhexidine  15 mL Mouth Rinse BID  . enoxaparin (LOVENOX) injection  40 mg Subcutaneous Q24H  . pantoprazole (PROTONIX) IV  40 mg Intravenous Q12H   Infusions:  . dextrose 5 % and 0.9% NaCl 100 mL/hr at 07/19/14 0247   PRN: acetaminophen **OR** acetaminophen, hydrALAZINE, morphine injection, ondansetron **OR** ondansetron (ZOFRAN) IV  Assessment: 58 y/o M s/p partial gastrectomy and resection of GIST one year ago, presented 12/29 with n/v, abdominal distention and admitted with SBO.  Orders received to begin Lovenox for VTE prophylaxis with pharmacy dosing assistance.  Wt and estimated CrCl are adequate for standard prophylactic Lovenox  dosage.  Plan:  Begin Lovenox 40 mg SQ q24h CBC q 72 h Will follow at a distance.  Clayburn Pert, PharmD, BCPS Pager: [phone] 07/19/2014  10:41 AM    .

## 2014-07-19 NOTE — Consult Note (Signed)
Reason for Consult:vomiting Referring Physician: Dr. Arvella Nigh is an 58 y.o. male.  HPI: The patient is a 58 yo white male who presents with abdominal pain that started late Sunday and into Monday. This was associated with abdominal distension and nausea and vomiting. His last bm was Monday. He is receiving chemo for a GIST tumor that was removed from his stomach by [name] about a year ago. CT shows sbo  Past Medical History  Diagnosis Date  . GERD (gastroesophageal reflux disease)   . Vertigo   . Sleep apnea     Intolerant of CPAP  . History of alcoholism     Quit drinking in 1985  . Gastric neoplasm     Cystic, 18 cm - recently diagnosed October 2014   . Pneumonia age 67    hx of  . Heart murmur age 34  . Cancer   . Complication of anesthesia     slow to awaken  . Rash     upper back due to gleevac    Past Surgical History  Procedure Laterality Date  . Tumor removal      Benign tumor removed from left breast and back area.   . Eus N/A 05/25/2013    Procedure: ESOPHAGEAL ENDOSCOPIC ULTRASOUND (EUS) RADIAL;  Surgeon: [name], MD;  Location: [facility];  Service: Endoscopy;  Laterality: N/A;  . Fine needle aspiration N/A 05/25/2013    Procedure: FINE NEEDLE ASPIRATION (FNA) LINEAR;  Surgeon: [name], MD;  Location: [facility];  Service: Endoscopy;  Laterality: N/A;"this part never happend"  . Laparoscopic gastrectomy N/A 07/19/2013    Procedure: RESECTION OF RETROGASTRIC CYSTIC NEOPLASM WITH PARTIAL GASTRECTOMY;  Surgeon: [name], MD;  Location: [facility];  Service: General;  Laterality: N/A;  . Lipoma removed from back  yrs ago  . Breast tumor removed Right 1976  . Eus N/A 07/12/2014    Procedure: ESOPHAGEAL ENDOSCOPIC ULTRASOUND (EUS) RADIAL;  Surgeon: [name], MD;  Location: [facility];  Service: Endoscopy;  Laterality: N/A;    Family History  Problem Relation Age of Onset  . Colon cancer Neg Hx   . Stomach cancer Neg Hx    . Breast cancer Mother   . CAD Brother     Premature disease    Social History:  reports that he has been smoking Cigarettes.  He has a 20 pack-year smoking history. He has never used smokeless tobacco. He reports that he does not drink alcohol or use illicit drugs.  Allergies:  Allergies  Allergen Reactions  . Nicoderm [Nicotine] Rash    Medications: I have reviewed the patient's current medications.  Results for orders placed or performed during the hospital encounter of 07/18/14 (from the past 48 hour(s))  CBC with Differential     Status: Abnormal   Collection Time: 07/18/14 11:00 PM  Result Value Ref Range   WBC 12.3 (H) 4.0 - 10.5 K/uL   RBC 4.57 4.22 - 5.81 MIL/uL   Hemoglobin 14.1 13.0 - 17.0 g/dL   HCT 43.4 39.0 - 52.0 %   MCV 95.0 78.0 - 100.0 fL   MCH 30.9 26.0 - 34.0 pg   MCHC 32.5 30.0 - 36.0 g/dL   RDW 13.2 11.5 - 15.5 %   Platelets 307 150 - 400 K/uL   Neutrophils Relative % 84 (H) 43 - 77 %   Neutro Abs 10.4 (H) 1.7 - 7.7 K/uL   Lymphocytes Relative 11 (L) 12 - 46 %  Lymphs Abs 1.4 0.7 - 4.0 K/uL   Monocytes Relative 4 3 - 12 %   Monocytes Absolute 0.5 0.1 - 1.0 K/uL   Eosinophils Relative 1 0 - 5 %   Eosinophils Absolute 0.1 0.0 - 0.7 K/uL   Basophils Relative 0 0 - 1 %   Basophils Absolute 0.0 0.0 - 0.1 K/uL  Comprehensive metabolic panel     Status: Abnormal   Collection Time: 07/18/14 11:00 PM  Result Value Ref Range   Sodium 138 135 - 145 mmol/L    Comment: Please note change in reference range.   Potassium 4.3 3.5 - 5.1 mmol/L    Comment: Please note change in reference range.   Chloride 104 96 - 112 mEq/L   CO2 26 19 - 32 mmol/L   Glucose, Bld 121 (H) 70 - 99 mg/dL   BUN 17 6 - 23 mg/dL   Creatinine, Ser 1.06 0.50 - 1.35 mg/dL   Calcium 9.2 8.4 - 10.5 mg/dL   Total Protein 7.5 6.0 - 8.3 g/dL   Albumin 4.4 3.5 - 5.2 g/dL   AST 24 0 - 37 U/L   ALT 27 0 - 53 U/L   Alkaline Phosphatase 83 39 - 117 U/L   Total Bilirubin 0.6 0.3 - 1.2 mg/dL    GFR calc non Af Amer 76 (L) >90 mL/min   GFR calc Af Amer 88 (L) >90 mL/min    Comment: (NOTE) The eGFR has been calculated using the CKD EPI equation. This calculation has not been validated in all clinical situations. eGFR's persistently <90 mL/min signify possible Chronic Kidney Disease.    Anion gap 8 5 - 15  Lipase, blood     Status: None   Collection Time: 07/18/14 11:00 PM  Result Value Ref Range   Lipase 36 11 - 59 U/L  Protime-INR     Status: None   Collection Time: 07/18/14 11:00 PM  Result Value Ref Range   Prothrombin Time 12.9 11.6 - 15.2 seconds   INR 0.96 0.00 - 1.49    Ct Abdomen Pelvis W Contrast  07/19/2014   CLINICAL DATA:  Sudden onset nausea and vomiting  EXAM: CT ABDOMEN AND PELVIS WITH CONTRAST  TECHNIQUE: Multidetector CT imaging of the abdomen and pelvis was performed using the standard protocol following bolus administration of intravenous contrast.  CONTRAST:  100mL OMNIPAQUE IOHEXOL 300 MG/ML  SOLN  COMPARISON:  07/07/2014  FINDINGS: BODY WALL: Status post right chest wall surgery, partially visualized.  LOWER CHEST: Unremarkable.  ABDOMEN/PELVIS:  Liver: No metastatic focus identified.  Biliary: No evidence of biliary obstruction or stone.  Pancreas: Unremarkable.  Spleen: Unremarkable.  Adrenals: Unremarkable.  Kidneys and ureters: Presumed sub cm cysts in the left upper pole. No hydronephrosis.  Bladder: Unremarkable.  Reproductive: Unremarkable.  Bowel: There is bowel dilatation and fluid filling above a transition point in the central abdomen on image 59. The more distal bowel is decompressed. This is in a region of previously described fat lesion with peripheral soft tissue rim, measuring up to 8 cm. As previously suggested, this structure favors a omental infarct. Status post resection of a gastric GIST. No abnormal soft tissue thickening in the region of the suture line. No evidence of bowel perforation or ischemia.  Retroperitoneum: No mass or adenopathy.   Peritoneum: No ascites or pneumoperitoneum.  Vascular: No acute abnormality.  OSSEOUS: No acute abnormalities.  IMPRESSION: High-grade small bowel obstruction. The transition point is near a probable omental infarct, suspect adhesion or   internal hernia.   Electronically Signed   By: Jonathan  Watts M.D.   On: 07/19/2014 00:14    Review of Systems  Constitutional: Negative.   HENT: Negative.   Eyes: Negative.   Respiratory: Negative.   Cardiovascular: Negative.   Gastrointestinal: Positive for nausea, vomiting and abdominal pain.  Genitourinary: Negative.   Musculoskeletal: Negative.   Skin: Negative.   Neurological: Negative.   Endo/Heme/Allergies: Negative.   Psychiatric/Behavioral: Negative.    Blood pressure 163/71, pulse 85, temperature 98.1 F (36.7 C), temperature source Oral, resp. rate 16, height 6' (1.829 m), weight 183 lb 3.2 oz (83.1 kg), SpO2 99 %. Physical Exam  Constitutional: He is oriented to person, place, and time. He appears well-developed and well-nourished.  HENT:  Head: Normocephalic and atraumatic.  Eyes: Conjunctivae and EOM are normal. Pupils are equal, round, and reactive to light.  Neck: Normal range of motion. Neck supple.  Cardiovascular: Normal rate, regular rhythm and normal heart sounds.   Respiratory: Effort normal and breath sounds normal.  GI: Soft. Bowel sounds are normal.  There is mild tenderness and distension. The abdomen is very soft  Musculoskeletal: Normal range of motion.  Neurological: He is alert and oriented to person, place, and time.  Skin: Skin is warm and dry.  Psychiatric: He has a normal mood and affect. His behavior is normal.    Assessment/Plan: The patient appears to have an SBO. Since having the ng placed his abdomen is a lot softer and smaller. I would agree with ng and continued bowel rest. I would recheck abd xrays in am. We will follow closely.  TOTH III,PAUL S 07/19/2014, 3:09 AM      

## 2014-07-19 NOTE — Progress Notes (Signed)
[facility] PROGRESS NOTE  Timothy Hall KKX:[id number] DOB: 05-19-1956 DOA: 07/18/2014  PCP: Sherrie Mustache, MD  Brief HPI: 58 year old Caucasian male who presents with abdominal distention and pain, nausea. He is found to have a small bowel obstruction.  Past medical history:  Past Medical History  Diagnosis Date  . GERD (gastroesophageal reflux disease)   . Vertigo   . Sleep apnea     Intolerant of CPAP  . History of alcoholism     Quit drinking in 1985  . Gastric neoplasm     Cystic, 18 cm - recently diagnosed October 2014   . Pneumonia age 32    hx of  . Heart murmur age 46  . Cancer   . Complication of anesthesia     slow to awaken  . Rash     upper back due to gleevac    Consultants: Gen. surgery  Procedures: NG tube placement  Antibiotics: None  Subjective: Patient feels better. Abdomen is not as distended. No nausea, vomiting since NG tube was placed. Still not passing any flatus.  Objective: Vital Signs  Filed Vitals:   07/18/14 2158 07/19/14 0046 07/19/14 0217 07/19/14 0617  BP: 155/70 140/84 163/71 138/69  Pulse: 85 92 85 90  Temp: 98.6 F (37 C)  98.1 F (36.7 C) 98 F (36.7 C)  TempSrc: Oral  Oral Oral  Resp: 18 16 16 16   Height:   6' (1.829 m)   Weight:   83.1 kg (183 lb 3.2 oz)   SpO2: 98% 99% 99% 97%    Intake/Output Summary (Last 24 hours) at 07/19/14 1126 Last data filed at 07/19/14 0138  Gross per 24 hour  Intake      0 ml  Output    400 ml  Net   -400 ml   Filed Weights   07/19/14 0217  Weight: 83.1 kg (183 lb 3.2 oz)    General appearance: alert, cooperative, appears stated age and no distress Head: Normocephalic, without obvious abnormality, atraumatic Resp: clear to auscultation bilaterally Cardio: regular rate and rhythm, S1, S2 normal, no murmur, click, rub or gallop GI: soft, minimally tender in the lower quadrants without any rebound, rigidity or guarding. Bowel sounds are present. No masses or  organomegaly. Extremities: extremities normal, atraumatic, no cyanosis or edema  Lab Results:  Basic Metabolic Panel:  Recent Labs Lab 07/18/14 2300 07/19/14 0312  NA 138 134*  K 4.3 3.8  CL 104 97  CO2 26 26  GLUCOSE 121* 135*  BUN 17 17  CREATININE 1.06 1.08  CALCIUM 9.2 8.6   Liver Function Tests:  Recent Labs Lab 07/18/14 2300 07/19/14 0312  AST 24 22  ALT 27 25  ALKPHOS 83 77  BILITOT 0.6 0.5  PROT 7.5 7.1  ALBUMIN 4.4 4.1    Recent Labs Lab 07/18/14 2300  LIPASE 36   CBC:  Recent Labs Lab 07/18/14 2300 07/19/14 0312 07/19/14 0625 07/19/14 1010  WBC 12.3* 10.6* 9.7 9.3  NEUTROABS 10.4*  --   --   --   HGB 14.1 13.4 13.0 12.9*  HCT 43.4 41.3 39.6 40.4  MCV 95.0 94.9 95.2 95.7  PLT 307 292 275 277   CBG:  Recent Labs Lab 07/19/14 0645  GLUCAP 138*    Studies/Results: Ct Abdomen Pelvis W Contrast  07/19/2014   CLINICAL DATA:  Sudden onset nausea and vomiting  EXAM: CT ABDOMEN AND PELVIS WITH CONTRAST  TECHNIQUE: Multidetector CT imaging of the abdomen and pelvis was  performed using the standard protocol following bolus administration of intravenous contrast.  CONTRAST:  125mL OMNIPAQUE IOHEXOL 300 MG/ML  SOLN  COMPARISON:  07/07/2014  FINDINGS: BODY WALL: Status post right chest wall surgery, partially visualized.  LOWER CHEST: Unremarkable.  ABDOMEN/PELVIS:  Liver: No metastatic focus identified.  Biliary: No evidence of biliary obstruction or stone.  Pancreas: Unremarkable.  Spleen: Unremarkable.  Adrenals: Unremarkable.  Kidneys and ureters: Presumed sub cm cysts in the left upper pole. No hydronephrosis.  Bladder: Unremarkable.  Reproductive: Unremarkable.  Bowel: There is bowel dilatation and fluid filling above a transition point in the central abdomen on image 59. The more distal bowel is decompressed. This is in a region of previously described fat lesion with peripheral soft tissue rim, measuring up to 8 cm. As previously suggested, this  structure favors a omental infarct. Status post resection of a gastric GIST. No abnormal soft tissue thickening in the region of the suture line. No evidence of bowel perforation or ischemia.  Retroperitoneum: No mass or adenopathy.  Peritoneum: No ascites or pneumoperitoneum.  Vascular: No acute abnormality.  OSSEOUS: No acute abnormalities.  IMPRESSION: High-grade small bowel obstruction. The transition point is near a probable omental infarct, suspect adhesion or internal hernia.   Electronically Signed   By: Jorje Guild M.D.   On: 07/19/2014 00:14    Medications:  Scheduled: . antiseptic oral rinse  7 mL Mouth Rinse q12n4p  . chlorhexidine  15 mL Mouth Rinse BID  . enoxaparin (LOVENOX) injection  40 mg Subcutaneous Q24H  . pantoprazole (PROTONIX) IV  40 mg Intravenous Q12H   Continuous: . dextrose 5 % and 0.9% NaCl 100 mL/hr at 07/19/14 1315   WHQ:[id number] **OR** acetaminophen, hydrALAZINE, morphine injection, ondansetron **OR** ondansetron (ZOFRAN) IV  Assessment/Plan:  Principal Problem:   SBO (small bowel obstruction) Active Problems:   Gastric neoplasm s/p partial gastrectomy 07/19/2013   Hematemesis    Small bowel obstruction NG tube has been placed. He is feeling better. General surgery is following. Continue IV fluids. He is NPO. Pain medications as needed.  Hematemesis No further episodes. Hemoglobin remained stable. Could have been a small Mallory-Weiss tear after his episodes of vomiting. Continue PPI for now. Patient recently had an EGD. No need to consult GI at [facility]  Elevated blood pressure Closely follow blood pressure trends. When necessary IV hydralazine for systolic blood pressure more than 160.  History of OSA Patient states he does not use his CPAP.  GIST status post resection Further management per oncology.  DVT Prophylaxis: Lovenox. Change to SCDs due to hematemesis Code Status: Full code  Family Communication: Discussed with the patient    Disposition Plan: Not ready for discharge.    LOS: 1 day   [place] [facility] Pager [phone] 07/19/2014, 11:26 AM  If 7PM-7AM, please contact night-coverage at [email or web address], password [facility]

## 2014-07-19 NOTE — ED Notes (Signed)
Patient refused NG tube after two attempts.  He states, "You'll have to knock me out!  That hurts too much."  Primary RN made aware.

## 2014-07-19 NOTE — Progress Notes (Signed)
UR complete 

## 2014-07-20 ENCOUNTER — Inpatient Hospital Stay (HOSPITAL_COMMUNITY): Payer: BC Managed Care – PPO

## 2014-07-20 DIAGNOSIS — D371 Neoplasm of uncertain behavior of stomach: Secondary | ICD-10-CM

## 2014-07-20 LAB — BASIC METABOLIC PANEL
Anion gap: 6 (ref 5–15)
BUN: 19 mg/dL (ref 6–23)
CHLORIDE: 105 meq/L (ref 96–112)
CO2: 28 mmol/L (ref 19–32)
Calcium: 8 mg/dL — ABNORMAL LOW (ref 8.4–10.5)
Creatinine, Ser: 1.07 mg/dL (ref 0.50–1.35)
GFR calc Af Amer: 87 mL/min — ABNORMAL LOW (ref >90)
GFR calc non Af Amer: 75 mL/min — ABNORMAL LOW (ref >90)
Glucose, Bld: 109 mg/dL — ABNORMAL HIGH (ref 70–99)
Potassium: 3.4 mmol/L — ABNORMAL LOW (ref 3.5–5.1)
Sodium: 139 mmol/L (ref 135–145)

## 2014-07-20 LAB — GLUCOSE, CAPILLARY
GLUCOSE-CAPILLARY: 109 mg/dL — AB (ref 70–99)
GLUCOSE-CAPILLARY: 82 mg/dL (ref 70–99)
GLUCOSE-CAPILLARY: 78 mg/dL (ref 70–99)
Glucose-Capillary: 91 mg/dL (ref 70–99)

## 2014-07-20 MED ORDER — POTASSIUM CHLORIDE 10 MEQ/100ML IV SOLN
10.0000 meq | INTRAVENOUS | Status: AC
  Administered 2014-07-20 (×4): 10 meq via INTRAVENOUS
  Filled 2014-07-20 (×4): qty 100

## 2014-07-20 MED ORDER — DEXTROSE-NACL 5-0.9 % IV SOLN
INTRAVENOUS | Status: DC
  Administered 2014-07-20 – 2014-07-23 (×3): via INTRAVENOUS

## 2014-07-20 MED ORDER — PHENOL 1.4 % MT LIQD
1.0000 | OROMUCOSAL | Status: DC | PRN
  Filled 2014-07-20 (×2): qty 177

## 2014-07-20 NOTE — Progress Notes (Signed)
[facility] PROGRESS NOTE  Timothy Hall YQI:[id number] DOB: Jun 16, 1956 DOA: 07/18/2014  PCP: Sherrie Mustache, MD  Brief HPI: 58 year old Caucasian male who presents with abdominal distention and pain, nausea. He is found to have a small bowel obstruction.  Past medical history:  Past Medical History  Diagnosis Date  . GERD (gastroesophageal reflux disease)   . Vertigo   . Sleep apnea     Intolerant of CPAP  . History of alcoholism     Quit drinking in 1985  . Gastric neoplasm     Cystic, 18 cm - recently diagnosed October 2014   . Pneumonia age 40    hx of  . Heart murmur age 50  . Cancer   . Complication of anesthesia     slow to awaken  . Rash     upper back due to gleevac    Consultants: Gen. surgery  Procedures: NG tube placement  Antibiotics: None  Subjective: Patient feels better. He has had bowel movement. He is passing gas. Denies any nausea, vomiting.   Objective: Vital Signs  Filed Vitals:   07/19/14 0617 07/19/14 1333 07/19/14 2114 07/20/14 0606  BP: 138/69 128/55 126/64 143/71  Pulse: 90 76 80 76  Temp: 98 F (36.7 C) 98.7 F (37.1 C) 98.5 F (36.9 C) 97.6 F (36.4 C)  TempSrc: Oral Oral Oral Oral  Resp: 16 14 16 16   Height:      Weight:      SpO2: 97% 97% 97% 97%    Intake/Output Summary (Last 24 hours) at 07/20/14 3875 Last data filed at 07/19/14 1725  Gross per 24 hour  Intake      0 ml  Output    400 ml  Net   -400 ml   Filed Weights   07/19/14 0217  Weight: 83.1 kg (183 lb 3.2 oz)    General appearance: alert, cooperative, appears stated age and no distress Resp: clear to auscultation bilaterally Cardio: regular rate and rhythm, S1, S2 normal, no murmur, click, rub or gallop GI: soft, minimally tender in the lower quadrants without any rebound, rigidity or guarding. Bowel sounds are present. No masses or organomegaly. Extremities: extremities normal, atraumatic, no cyanosis or edema  Lab Results:  Basic  Metabolic Panel:  Recent Labs Lab 07/18/14 2300 07/19/14 0312 07/20/14 0550  NA 138 134* 139  K 4.3 3.8 3.4*  CL 104 97 105  CO2 26 26 28   GLUCOSE 121* 135* 109*  BUN 17 17 19   CREATININE 1.06 1.08 1.07  CALCIUM 9.2 8.6 8.0*   Liver Function Tests:  Recent Labs Lab 07/18/14 2300 07/19/14 0312  AST 24 22  ALT 27 25  ALKPHOS 83 77  BILITOT 0.6 0.5  PROT 7.5 7.1  ALBUMIN 4.4 4.1    Recent Labs Lab 07/18/14 2300  LIPASE 36   CBC:  Recent Labs Lab 07/18/14 2300 07/19/14 0312 07/19/14 0625 07/19/14 1010  WBC 12.3* 10.6* 9.7 9.3  NEUTROABS 10.4*  --   --   --   HGB 14.1 13.4 13.0 12.9*  HCT 43.4 41.3 39.6 40.4  MCV 95.0 94.9 95.2 95.7  PLT 307 292 275 277   CBG:  Recent Labs Lab 07/19/14 0645 07/19/14 1209 07/19/14 1820 07/20/14 0001 07/20/14 0602  GLUCAP 138* 126* 114* 110* 109*    Studies/Results: Ct Abdomen Pelvis W Contrast  07/19/2014   CLINICAL DATA:  Sudden onset nausea and vomiting  EXAM: CT ABDOMEN AND PELVIS WITH CONTRAST  TECHNIQUE: Multidetector  CT imaging of the abdomen and pelvis was performed using the standard protocol following bolus administration of intravenous contrast.  CONTRAST:  147mL OMNIPAQUE IOHEXOL 300 MG/ML  SOLN  COMPARISON:  07/07/2014  FINDINGS: BODY WALL: Status post right chest wall surgery, partially visualized.  LOWER CHEST: Unremarkable.  ABDOMEN/PELVIS:  Liver: No metastatic focus identified.  Biliary: No evidence of biliary obstruction or stone.  Pancreas: Unremarkable.  Spleen: Unremarkable.  Adrenals: Unremarkable.  Kidneys and ureters: Presumed sub cm cysts in the left upper pole. No hydronephrosis.  Bladder: Unremarkable.  Reproductive: Unremarkable.  Bowel: There is bowel dilatation and fluid filling above a transition point in the central abdomen on image 59. The more distal bowel is decompressed. This is in a region of previously described fat lesion with peripheral soft tissue rim, measuring up to 8 cm. As  previously suggested, this structure favors a omental infarct. Status post resection of a gastric GIST. No abnormal soft tissue thickening in the region of the suture line. No evidence of bowel perforation or ischemia.  Retroperitoneum: No mass or adenopathy.  Peritoneum: No ascites or pneumoperitoneum.  Vascular: No acute abnormality.  OSSEOUS: No acute abnormalities.  IMPRESSION: High-grade small bowel obstruction. The transition point is near a probable omental infarct, suspect adhesion or internal hernia.   Electronically Signed   By: Jorje Guild M.D.   On: 07/19/2014 00:14   Dg Abd 2 Views  07/20/2014   CLINICAL DATA:  Follow-up small bowel obstruction.  EXAM: ABDOMEN - 2 VIEW  COMPARISON:  CT 07/18/2014  FINDINGS: NG tube tip and side-port project over the stomach. Relative paucity of small bowel gas. Gas is demonstrated within the colon. No definite pneumatosis, portal venous gas or free intraperitoneal air identified. Unremarkable osseous skeleton. High density within the gallbladder lumen and bladder likely secondary to contrast material.  IMPRESSION: NGT tip and side-port project over the stomach.  Paucity of small bowel gas.  Distal gas within the colon.   Electronically Signed   By: Lovey Newcomer M.D.   On: 07/20/2014 08:51    Medications:  Scheduled: . antiseptic oral rinse  7 mL Mouth Rinse q12n4p  . chlorhexidine  15 mL Mouth Rinse BID  . pantoprazole (PROTONIX) IV  40 mg Intravenous Q12H   Continuous:   VEL:[id number] **OR** acetaminophen, hydrALAZINE, morphine injection, ondansetron **OR** ondansetron (ZOFRAN) IV  Assessment/Plan:  Principal Problem:   SBO (small bowel obstruction) Active Problems:   Gastric neoplasm s/p partial gastrectomy 07/19/2013   Hematemesis    Small bowel obstruction Management per general surgery. He seems to be improving slowly. He is having bowel movements. Continues to have NG tube. Continue IV fluids. He is NPO. Pain medications as  needed.  Hematemesis No further episodes. Hemoglobin remained stable. Could have been a small Mallory-Weiss tear after his episodes of vomiting. Continue PPI for now. Patient recently had an EGD. No need to consult GI.  Elevated blood pressure Closely follow blood pressure trends. When necessary IV hydralazine for systolic blood pressure more than 160.  History of OSA Patient states he does not use his CPAP.  GIST status post resection Further management per oncology as outpatient.  DVT Prophylaxis: SCDs Code Status: Full code  Family Communication: Discussed with the patient  Disposition Plan: Not ready for discharge.    LOS: 2 days   [place] [facility] Pager [phone] 07/20/2014, 9:07 AM  If 7PM-7AM, please contact night-coverage at [email or web address], password [facility]

## 2014-07-20 NOTE — Progress Notes (Signed)
Patient ID: Timothy Hall, male   DOB: 02-Mar-1956, 58 y.o.   MRN: [id number]    Subjective: Feels better, crampy pain seems resolved.  Had a BM last night  Objective: Vital signs in last 24 hours: Temp:  [97.6 F (36.4 C)-98.7 F (37.1 C)] 97.6 F (36.4 C) (12/31 0606) Pulse Rate:  [76-80] 76 (12/31 0606) Resp:  [14-16] 16 (12/31 0606) BP: (126-143)/(55-71) 143/71 mmHg (12/31 0606) SpO2:  [97 %] 97 % (12/31 0606) Last BM Date: 07/19/14  Intake/Output from previous day: 12/30 0701 - 12/31 0700 In: -  Out: 400 [Emesis/NG output:400] Intake/Output this shift:    General appearance: alert, cooperative and no distress GI: normal findings: soft, non-tender and possible mild distention  Lab Results:   Recent Labs  07/19/14 0625 07/19/14 1010  WBC 9.7 9.3  HGB 13.0 12.9*  HCT 39.6 40.4  PLT 275 277   BMET  Recent Labs  07/19/14 0312 07/20/14 0550  NA 134* 139  K 3.8 3.4*  CL 97 105  CO2 26 28  GLUCOSE 135* 109*  BUN 17 19  CREATININE 1.08 1.07  CALCIUM 8.6 8.0*     Studies/Results: Ct Abdomen Pelvis W Contrast  07/19/2014   CLINICAL DATA:  Sudden onset nausea and vomiting  EXAM: CT ABDOMEN AND PELVIS WITH CONTRAST  TECHNIQUE: Multidetector CT imaging of the abdomen and pelvis was performed using the standard protocol following bolus administration of intravenous contrast.  CONTRAST:  170mL OMNIPAQUE IOHEXOL 300 MG/ML  SOLN  COMPARISON:  07/07/2014  FINDINGS: BODY WALL: Status post right chest wall surgery, partially visualized.  LOWER CHEST: Unremarkable.  ABDOMEN/PELVIS:  Liver: No metastatic focus identified.  Biliary: No evidence of biliary obstruction or stone.  Pancreas: Unremarkable.  Spleen: Unremarkable.  Adrenals: Unremarkable.  Kidneys and ureters: Presumed sub cm cysts in the left upper pole. No hydronephrosis.  Bladder: Unremarkable.  Reproductive: Unremarkable.  Bowel: There is bowel dilatation and fluid filling above a transition point in the central  abdomen on image 59. The more distal bowel is decompressed. This is in a region of previously described fat lesion with peripheral soft tissue rim, measuring up to 8 cm. As previously suggested, this structure favors a omental infarct. Status post resection of a gastric GIST. No abnormal soft tissue thickening in the region of the suture line. No evidence of bowel perforation or ischemia.  Retroperitoneum: No mass or adenopathy.  Peritoneum: No ascites or pneumoperitoneum.  Vascular: No acute abnormality.  OSSEOUS: No acute abnormalities.  IMPRESSION: High-grade small bowel obstruction. The transition point is near a probable omental infarct, suspect adhesion or internal hernia.   Electronically Signed   By: Jorje Guild M.D.   On: 07/19/2014 00:14   Dg Abd 2 Views  07/20/2014   CLINICAL DATA:  Follow-up small bowel obstruction.  EXAM: ABDOMEN - 2 VIEW  COMPARISON:  CT 07/18/2014  FINDINGS: NG tube tip and side-port project over the stomach. Relative paucity of small bowel gas. Gas is demonstrated within the colon. No definite pneumatosis, portal venous gas or free intraperitoneal air identified. Unremarkable osseous skeleton. High density within the gallbladder lumen and bladder likely secondary to contrast material.  IMPRESSION: NGT tip and side-port project over the stomach.  Paucity of small bowel gas.  Distal gas within the colon.   Electronically Signed   By: Lovey Newcomer M.D.   On: 07/20/2014 08:51    Anti-infectives: Anti-infectives    None      Assessment/Plan: SBO, likely secondary to  adhesions with area fat necrosis in mid abdomen Seems improved today.  Would continue bowel rest with NG for now. Ambulation encouraged    LOS: 2 days    Krystale Rinkenberger T 07/20/2014

## 2014-07-21 LAB — CBC
HEMATOCRIT: 37.2 % — AB (ref 39.0–52.0)
Hemoglobin: 12 g/dL — ABNORMAL LOW (ref 13.0–17.0)
MCH: 30.9 pg (ref 26.0–34.0)
MCHC: 32.3 g/dL (ref 30.0–36.0)
MCV: 95.9 fL (ref 78.0–100.0)
Platelets: 260 10*3/uL (ref 150–400)
RBC: 3.88 MIL/uL — AB (ref 4.22–5.81)
RDW: 13.1 % (ref 11.5–15.5)
WBC: 7.6 10*3/uL (ref 4.0–10.5)

## 2014-07-21 LAB — BASIC METABOLIC PANEL
Anion gap: 8 (ref 5–15)
BUN: 20 mg/dL (ref 6–23)
CO2: 26 mmol/L (ref 19–32)
CREATININE: 1 mg/dL (ref 0.50–1.35)
Calcium: 8.2 mg/dL — ABNORMAL LOW (ref 8.4–10.5)
Chloride: 107 mEq/L (ref 96–112)
GFR calc non Af Amer: 82 mL/min — ABNORMAL LOW (ref >90)
GLUCOSE: 94 mg/dL (ref 70–99)
Potassium: 3.6 mmol/L (ref 3.5–5.1)
Sodium: 141 mmol/L (ref 135–145)

## 2014-07-21 LAB — GLUCOSE, CAPILLARY
GLUCOSE-CAPILLARY: 97 mg/dL (ref 70–99)
Glucose-Capillary: 90 mg/dL (ref 70–99)

## 2014-07-21 NOTE — Progress Notes (Signed)
Patient ID: EREK KOWAL, male   DOB: [birth year], 59 y.o.   MRN: [id number]  General Surgery - [facility] - Progress Note  Subjective: Patient in bed, family at bedside.  Complains of NG irritating throat.  No abdominal pain.  Passing flatus this AM.  BM last PM.  Objective: Vital signs in last 24 hours: Temp:  [98 F (36.7 C)-98.4 F (36.9 C)] 98 F (36.7 C) (01/01 0604) Pulse Rate:  [69-79] 69 (01/01 0604) Resp:  [16] 16 (01/01 0604) BP: (117-130)/(59-70) 117/59 mmHg (01/01 0604) SpO2:  [97 %] 97 % (01/01 0604) Last BM Date: 07-26-2014  Intake/Output from previous day: 2023/07/27 0701 - 01/01 0700 In: 703.8 [I.V.:503.8; IV Piggyback:200] Out: 750 [Emesis/NG output:750]  Exam: HEENT - clear, not icteric Neck - soft Chest - clear bilaterally Cor - RRR, no murmur Abd - soft, mild distension; BS present; non-tender; thin foamy in NG, small output Ext - no significant edema Neuro - grossly intact, no focal deficits  Lab Results:   Recent Labs  07/19/14 1010 07/21/14 0455  WBC 9.3 7.6  HGB 12.9* 12.0*  HCT 40.4 37.2*  PLT 277 260     Recent Labs  07/26/2014 0550 07/21/14 0455  NA 139 141  K 3.4* 3.6  CL 105 107  CO2 28 26  GLUCOSE 109* 94  BUN 19 20  CREATININE 1.07 1.00  CALCIUM 8.0* 8.2*    Studies/Results: Dg Abd 2 Views  07-26-14   CLINICAL DATA:  Follow-up small bowel obstruction.  EXAM: ABDOMEN - 2 VIEW  COMPARISON:  CT 07/18/2014  FINDINGS: NG tube tip and side-port project over the stomach. Relative paucity of small bowel gas. Gas is demonstrated within the colon. No definite pneumatosis, portal venous gas or free intraperitoneal air identified. Unremarkable osseous skeleton. High density within the gallbladder lumen and bladder likely secondary to contrast material.  IMPRESSION: NGT tip and side-port project over the stomach.  Paucity of small bowel gas.  Distal gas within the colon.   Electronically Signed   By: Lovey Newcomer M.D.   On:  07-26-14 08:51    Assessment / Plan: 1.  Small bowel obstruction  Clinically improving  Will discontinue NG tube and allow sips of clear liquids today  Encourage OOB, ambulation  Earnstine Regal, MD, [facility] Office: [phone]  07/21/2014

## 2014-07-21 NOTE — Progress Notes (Signed)
[facility] PROGRESS NOTE  CONTRELL BALLENTINE XFG:[id number] DOB: [birth year] DOA: 07/18/2014  PCP: Sherrie Mustache, MD  Brief HPI: 59 year old Caucasian male who presents with abdominal distention and pain, nausea. He is found to have a small bowel obstruction. He was admitted with NG tube. He was seen by general surgery.  Past medical history:  Past Medical History  Diagnosis Date  . GERD (gastroesophageal reflux disease)   . Vertigo   . Sleep apnea     Intolerant of CPAP  . History of alcoholism     Quit drinking in 1985  . Gastric neoplasm     Cystic, 18 cm - recently diagnosed October 2014   . Pneumonia age 3    hx of  . Heart murmur age 66  . Cancer   . Complication of anesthesia     slow to awaken  . Rash     upper back due to gleevac    Consultants: Gen. surgery  Procedures: NG tube placement. Removed on January 1  Antibiotics: None  Subjective: Patient continues to feel better. He had another bowel movement this morning. Passing gas. Denies any nausea or vomiting.   Objective: Vital Signs  Filed Vitals:   08-07-2014 0606 Aug 07, 2014 1434 08/07/2014 2149 07/21/14 0604  BP: 143/71 126/70 130/66 117/59  Pulse: 76 79 76 69  Temp: 97.6 F (36.4 C) 98.3 F (36.8 C) 98.4 F (36.9 C) 98 F (36.7 C)  TempSrc: Oral Oral Oral Oral  Resp: 16 16 16 16   Height:      Weight:      SpO2: 97% 97% 97% 97%    Intake/Output Summary (Last 24 hours) at 07/21/14 9678 Last data filed at 07/21/14 9381  Gross per 24 hour  Intake 703.75 ml  Output    950 ml  Net -246.25 ml   Filed Weights   07/19/14 0217  Weight: 83.1 kg (183 lb 3.2 oz)    General appearance: alert, cooperative, appears stated age and no distress Resp: clear to auscultation bilaterally Cardio: regular rate and rhythm, S1, S2 normal, no murmur, click, rub or gallop GI: soft, nontender today. Bowel sounds are present. No masses or organomegaly. Extremities: extremities normal, atraumatic, no  cyanosis or edema  Lab Results:  Basic Metabolic Panel:  Recent Labs Lab 07/18/14 2300 07/19/14 0312 2014/08/07 0550 07/21/14 0455  NA 138 134* 139 141  K 4.3 3.8 3.4* 3.6  CL 104 97 105 107  CO2 26 26 28 26   GLUCOSE 121* 135* 109* 94  BUN 17 17 19 20   CREATININE 1.06 1.08 1.07 1.00  CALCIUM 9.2 8.6 8.0* 8.2*   Liver Function Tests:  Recent Labs Lab 07/18/14 2300 07/19/14 0312  AST 24 22  ALT 27 25  ALKPHOS 83 77  BILITOT 0.6 0.5  PROT 7.5 7.1  ALBUMIN 4.4 4.1    Recent Labs Lab 07/18/14 2300  LIPASE 36   CBC:  Recent Labs Lab 07/18/14 2300 07/19/14 0312 07/19/14 0625 07/19/14 1010 07/21/14 0455  WBC 12.3* 10.6* 9.7 9.3 7.6  NEUTROABS 10.4*  --   --   --   --   HGB 14.1 13.4 13.0 12.9* 12.0*  HCT 43.4 41.3 39.6 40.4 37.2*  MCV 95.0 94.9 95.2 95.7 95.9  PLT 307 292 275 277 260   CBG:  Recent Labs Lab 08-07-2014 0001 08-07-14 0602 Aug 07, 2014 1218 08/07/14 1750 07-Aug-2014 2252  GLUCAP 110* 109* 91 82 78    Studies/Results: Dg Abd 2 Views  2014/08/07  CLINICAL DATA:  Follow-up small bowel obstruction.  EXAM: ABDOMEN - 2 VIEW  COMPARISON:  CT 07/18/2014  FINDINGS: NG tube tip and side-port project over the stomach. Relative paucity of small bowel gas. Gas is demonstrated within the colon. No definite pneumatosis, portal venous gas or free intraperitoneal air identified. Unremarkable osseous skeleton. High density within the gallbladder lumen and bladder likely secondary to contrast material.  IMPRESSION: NGT tip and side-port project over the stomach.  Paucity of small bowel gas.  Distal gas within the colon.   Electronically Signed   By: Lovey Newcomer M.D.   On: 07/20/2014 08:51    Medications:  Scheduled: . antiseptic oral rinse  7 mL Mouth Rinse q12n4p  . chlorhexidine  15 mL Mouth Rinse BID  . pantoprazole (PROTONIX) IV  40 mg Intravenous Q12H   Continuous: . dextrose 5 % and 0.9% NaCl 75 mL/hr at 07/20/14 2317   [id number] **OR**  acetaminophen, hydrALAZINE, morphine injection, ondansetron **OR** ondansetron (ZOFRAN) IV, phenol  Assessment/Plan:  Principal Problem:   SBO (small bowel obstruction) Active Problems:   Gastric neoplasm s/p partial gastrectomy 07/19/2013   Hematemesis    Small bowel obstruction Management per general surgery. He seems to be improving slowly. He is having bowel movements. NG tube to be removed today per general surgery. Clear liquids to be initiated.   Hematemesis No further episodes. Hemoglobin remained stable. Could have been a small Mallory-Weiss tear after his episodes of vomiting. Continue PPI for now. Patient recently had an EGD. No need to consult GI.  Elevated blood pressure Blood pressures have improved. Elevated BP. Was likely due to acute illness.   History of OSA Patient states he does not use his CPAP.  GIST status post resection Further management per oncology as outpatient. Can resume his Gleevec at discharge.  DVT Prophylaxis: SCDs Code Status: Full code  Family Communication: Discussed with the patient and his wife Disposition Plan: Not ready for discharge. Mobilize. Will likely return home when better.    LOS: 3 days   [facility] Pager [phone] 07/21/2014, 9:27 AM  If 7PM-7AM, please contact night-coverage at [email or web address], password [facility]

## 2014-07-22 LAB — GLUCOSE, CAPILLARY
GLUCOSE-CAPILLARY: 87 mg/dL (ref 70–99)
GLUCOSE-CAPILLARY: 104 mg/dL — AB (ref 70–99)
Glucose-Capillary: 105 mg/dL — ABNORMAL HIGH (ref 70–99)
Glucose-Capillary: 109 mg/dL — ABNORMAL HIGH (ref 70–99)

## 2014-07-22 LAB — BASIC METABOLIC PANEL
Anion gap: 7 (ref 5–15)
BUN: 18 mg/dL (ref 6–23)
CALCIUM: 8.1 mg/dL — AB (ref 8.4–10.5)
CO2: 26 mmol/L (ref 19–32)
Chloride: 106 mEq/L (ref 96–112)
Creatinine, Ser: 0.92 mg/dL (ref 0.50–1.35)
GFR calc non Af Amer: >90 mL/min (ref >90)
GLUCOSE: 100 mg/dL — AB (ref 70–99)
Potassium: 3.6 mmol/L (ref 3.5–5.1)
SODIUM: 139 mmol/L (ref 135–145)

## 2014-07-22 LAB — CBC
HCT: 34.8 % — ABNORMAL LOW (ref 39.0–52.0)
Hemoglobin: 11.5 g/dL — ABNORMAL LOW (ref 13.0–17.0)
MCH: 31.4 pg (ref 26.0–34.0)
MCHC: 33 g/dL (ref 30.0–36.0)
MCV: 95.1 fL (ref 78.0–100.0)
Platelets: 238 10*3/uL (ref 150–400)
RBC: 3.66 MIL/uL — ABNORMAL LOW (ref 4.22–5.81)
RDW: 12.9 % (ref 11.5–15.5)
WBC: 6.6 10*3/uL (ref 4.0–10.5)

## 2014-07-22 NOTE — Progress Notes (Signed)
[facility] PROGRESS NOTE  Timothy Hall BOF:[id number] DOB: 09-13-1955 DOA: 07/18/2014  PCP: Sherrie Mustache, MD  Brief HPI: 59 year old Caucasian male who presents with abdominal distention and pain, nausea. He is found to have a small bowel obstruction. He was admitted with NG tube. He was seen by general surgery.  Past medical history:  Past Medical History  Diagnosis Date  . GERD (gastroesophageal reflux disease)   . Vertigo   . Sleep apnea     Intolerant of CPAP  . History of alcoholism     Quit drinking in 1985  . Gastric neoplasm     Cystic, 18 cm - recently diagnosed October 2014   . Pneumonia age 87    hx of  . Heart murmur age 73  . Cancer   . Complication of anesthesia     slow to awaken  . Rash     upper back due to gleevac    Consultants: Gen. surgery  Procedures: NG tube placement. Removed on January 1  Antibiotics: None  Subjective: Patient feels better. He wants to try solid food. Continues to pass gas. Denies nausea, vomiting. Tolerated his liquids yesterday.   Objective: Vital Signs  Filed Vitals:   07/21/14 0604 07/21/14 1500 07/21/14 2133 07/22/14 0610  BP: 117/59 111/53 118/66 133/71  Pulse: 69 81 66 69  Temp: 98 F (36.7 C) 97.6 F (36.4 C) 98.2 F (36.8 C) 98.6 F (37 C)  TempSrc: Oral Oral Oral Oral  Resp: 16 16 18 20   Height:      Weight:      SpO2: 97% 98% 98% 98%    Intake/Output Summary (Last 24 hours) at 07/22/14 0933 Last data filed at 07/21/14 1700  Gross per 24 hour  Intake     89 ml  Output    100 ml  Net    -11 ml   Filed Weights   07/19/14 0217  Weight: 83.1 kg (183 lb 3.2 oz)    General appearance: alert, cooperative, appears stated age and no distress Resp: clear to auscultation bilaterally Cardio: regular rate and rhythm, S1, S2 normal, no murmur, click, rub or gallop GI: soft, nontender today. Bowel sounds are present. No masses or organomegaly. Extremities: extremities normal, atraumatic,  no cyanosis or edema  Lab Results:  Basic Metabolic Panel:  Recent Labs Lab 07/18/14 2300 07/19/14 0312 07/20/14 0550 07/21/14 0455 07/22/14 0530  NA 138 134* 139 141 139  K 4.3 3.8 3.4* 3.6 3.6  CL 104 97 105 107 106  CO2 26 26 28 26 26   GLUCOSE 121* 135* 109* 94 100*  BUN 17 17 19 20 18   CREATININE 1.06 1.08 1.07 1.00 0.92  CALCIUM 9.2 8.6 8.0* 8.2* 8.1*   Liver Function Tests:  Recent Labs Lab 07/18/14 2300 07/19/14 0312  AST 24 22  ALT 27 25  ALKPHOS 83 77  BILITOT 0.6 0.5  PROT 7.5 7.1  ALBUMIN 4.4 4.1    Recent Labs Lab 07/18/14 2300  LIPASE 36   CBC:  Recent Labs Lab 07/18/14 2300 07/19/14 0312 07/19/14 0625 07/19/14 1010 07/21/14 0455 07/22/14 0530  WBC 12.3* 10.6* 9.7 9.3 7.6 6.6  NEUTROABS 10.4*  --   --   --   --   --   HGB 14.1 13.4 13.0 12.9* 12.0* 11.5*  HCT 43.4 41.3 39.6 40.4 37.2* 34.8*  MCV 95.0 94.9 95.2 95.7 95.9 95.1  PLT 307 292 275 277 260 238   CBG:  Recent Labs  Lab 07/20/14 1750 07/20/14 2252 07/21/14 1718 07/21/14 2353 07/22/14 0615  GLUCAP 82 78 97 90 87    Studies/Results: No results found.  Medications:  Scheduled: . antiseptic oral rinse  7 mL Mouth Rinse q12n4p  . chlorhexidine  15 mL Mouth Rinse BID  . pantoprazole (PROTONIX) IV  40 mg Intravenous Q12H   Continuous: . dextrose 5 % and 0.9% NaCl 75 mL/hr at 07/21/14 1013   [id number] **OR** acetaminophen, hydrALAZINE, morphine injection, ondansetron **OR** ondansetron (ZOFRAN) IV, phenol  Assessment/Plan:  Principal Problem:   SBO (small bowel obstruction) Active Problems:   Gastric neoplasm s/p partial gastrectomy 07/19/2013   Hematemesis    Small bowel obstruction Management per general surgery. He seems to be improving. Diet was advanced today. NG tube was removed yesterday.   Hematemesis No further episodes. Hemoglobin remains stable. Could have been a small Mallory-Weiss tear after his episodes of vomiting. Continue PPI for  now. Patient recently had an EGD. No need to consult GI.  Elevated blood pressure Blood pressures have improved. Elevated BP was likely due to acute illness.   History of OSA Patient states he does not use his CPAP.  GIST status post resection Further management per oncology as outpatient. Can resume his Gleevec at discharge.  DVT Prophylaxis: SCDs Code Status: Full code  Family Communication: Discussed with the patient and his wife Disposition Plan: Improving. Continue to mobilize. Possible discharge tomorrow.    LOS: 4 days   [facility] Pager [phone] 07/22/2014, 9:33 AM  If 7PM-7AM, please contact night-coverage at [email or web address], password [facility]

## 2014-07-22 NOTE — Progress Notes (Signed)
Patient ID: Timothy Hall, male   DOB: February 13, 1956, 59 y.o.   MRN: [id number]  General Surgery - [facility] - Progress Note  Subjective: Patient tolerating clear liquid diet - wants real food.  Ambulatory.  Denies nausea or emesis.  BM last PM and early this AM per patient.  Objective: Vital signs in last 24 hours: Temp:  [97.6 F (36.4 C)-98.6 F (37 C)] 98.6 F (37 C) (01/02 0610) Pulse Rate:  [66-81] 69 (01/02 0610) Resp:  [16-20] 20 (01/02 0610) BP: (111-133)/(53-71) 133/71 mmHg (01/02 0610) SpO2:  [98 %] 98 % (01/02 0610) Last BM Date: 07/21/14  Intake/Output from previous day: 01/01 0701 - 01/02 0700 In: 89 [P.O.:89] Out: 300 [Emesis/NG output:300]  Exam: HEENT - clear, not icteric Neck - soft Chest - clear bilaterally Cor - RRR, no murmur Abd - soft, protuberant, non-tender; active BS present Ext - no significant edema Neuro - grossly intact, no focal deficits  Lab Results:   Recent Labs  07/21/14 0455 07/22/14 0530  WBC 7.6 6.6  HGB 12.0* 11.5*  HCT 37.2* 34.8*  PLT 260 238     Recent Labs  07/21/14 0455 07/22/14 0530  NA 141 139  K 3.6 3.6  CL 107 106  CO2 26 26  GLUCOSE 94 100*  BUN 20 18  CREATININE 1.00 0.92  CALCIUM 8.2* 8.1*    Studies/Results: No results found.  Assessment / Plan: 1. Small bowel obstruction Clinically improving Begin regular diet today Encourage OOB, ambulation  If continued progress, probably home tomorrow  Earnstine Regal, MD, [facility] Office: [phone]  07/22/2014

## 2014-07-23 ENCOUNTER — Inpatient Hospital Stay (HOSPITAL_COMMUNITY): Payer: BC Managed Care – PPO

## 2014-07-23 LAB — BASIC METABOLIC PANEL
ANION GAP: 5 (ref 5–15)
BUN: 19 mg/dL (ref 6–23)
CO2: 29 mmol/L (ref 19–32)
Calcium: 8.7 mg/dL (ref 8.4–10.5)
Chloride: 108 mEq/L (ref 96–112)
Creatinine, Ser: 1.08 mg/dL (ref 0.50–1.35)
GFR calc Af Amer: 86 mL/min — ABNORMAL LOW (ref >90)
GFR calc non Af Amer: 74 mL/min — ABNORMAL LOW (ref >90)
GLUCOSE: 133 mg/dL — AB (ref 70–99)
POTASSIUM: 4.1 mmol/L (ref 3.5–5.1)
Sodium: 142 mmol/L (ref 135–145)

## 2014-07-23 LAB — GLUCOSE, CAPILLARY
Glucose-Capillary: 121 mg/dL — ABNORMAL HIGH (ref 70–99)
Glucose-Capillary: 108 mg/dL — ABNORMAL HIGH (ref 70–99)

## 2014-07-23 MED ORDER — DOCUSATE SODIUM 100 MG PO CAPS: 100.0000 mg | ORAL_CAPSULE | Freq: Two times a day (BID) | ORAL | Status: DC

## 2014-07-23 MED ORDER — POLYETHYLENE GLYCOL 3350 17 G PO PACK: 17.0000 g | PACK | Freq: Every day | ORAL | Status: DC | PRN

## 2014-07-23 MED ORDER — CALCIUM CARBONATE ANTACID 500 MG PO CHEW
1.0000 | CHEWABLE_TABLET | Freq: Four times a day (QID) | ORAL | Status: DC | PRN
  Administered 2014-07-23: 200 mg via ORAL

## 2014-07-23 NOTE — Discharge Summary (Addendum)
[facility]  Physician Discharge Summary   Patient ID: Timothy Hall MRN: [id number] DOB/AGE: 01/15/1956 59 y.o.  Admit date: 07/18/2014 Discharge date: 07/23/2014  PCP: Sherrie Mustache, MD  DISCHARGE DIAGNOSES:  Principal Problem:   SBO (small bowel obstruction) Active Problems:   Gastric neoplasm s/p partial gastrectomy 07/19/2013   Hematemesis   RECOMMENDATIONS FOR OUTPATIENT FOLLOW UP: 1. Close follow up with surgeon or PCP  DISCHARGE CONDITION: fair  Diet recommendation: Regular as tolerated  Filed Weights   07/19/14 0217  Weight: 83.1 kg (183 lb 3.2 oz)    INITIAL HISTORY: 59 year old Caucasian male who presents with abdominal distention and pain, nausea. He is found to have a small bowel obstruction. He was admitted with NG tube. He was seen by general surgery.  Consultants: Gen. surgery  Procedures: NG tube placement. Removed on January 1  HOSPITAL COURSE:   Small bowel obstruction NG was placed. He slowly improved. General surgery was following. NG was then removed. He was placed on clear liquids. Then advanced. He had one episode of vomiting this morning. AXR was obtained and not concerning per surgery. They felt he could go home if he tolerates lunch. Patient was fine after lunch. He will wait another 30 mins and then can go home. Stool softeners and laxatives at home as needed.  Hematemesis He did not have further episodes. Hemoglobin remained stable. He could have had a small Mallory-Weiss tear after his episodes of vomiting. Continue home PPI for now. Patient recently had an EGD.  Elevated blood pressure Was noted to have high BP at admission. Blood pressures have improved. Elevated BP was likely due to acute illness.   History of OSA Patient states he does not use his CPAP.  GIST status post resection Further management per oncology as outpatient. Can resume his Gleevec at discharge.  Green for discharge if he remains symptom free  after lunch.   PERTINENT LABS:  The results of significant diagnostics from this hospitalization (including imaging, microbiology, ancillary and laboratory) are listed below for reference.     Labs: Basic Metabolic Panel:  Recent Labs Lab 07/19/14 0312 07/20/14 0550 07/21/14 0455 07/22/14 0530 07/23/14 0500  NA 134* 139 141 139 142  K 3.8 3.4* 3.6 3.6 4.1  CL 97 105 107 106 108  CO2 26 28 26 26 29   GLUCOSE 135* 109* 94 100* 133*  BUN 17 19 20 18 19   CREATININE 1.08 1.07 1.00 0.92 1.08  CALCIUM 8.6 8.0* 8.2* 8.1* 8.7   Liver Function Tests:  Recent Labs Lab 07/18/14 2300 07/19/14 0312  AST 24 22  ALT 27 25  ALKPHOS 83 77  BILITOT 0.6 0.5  PROT 7.5 7.1  ALBUMIN 4.4 4.1    Recent Labs Lab 07/18/14 2300  LIPASE 36   CBC:  Recent Labs Lab 07/18/14 2300 07/19/14 0312 07/19/14 0625 07/19/14 1010 07/21/14 0455 07/22/14 0530  WBC 12.3* 10.6* 9.7 9.3 7.6 6.6  NEUTROABS 10.4*  --   --   --   --   --   HGB 14.1 13.4 13.0 12.9* 12.0* 11.5*  HCT 43.4 41.3 39.6 40.4 37.2* 34.8*  MCV 95.0 94.9 95.2 95.7 95.9 95.1  PLT 307 292 275 277 260 238   CBG:  Recent Labs Lab 07/22/14 1251 07/22/14 1822 07/22/14 2331 07/23/14 0531 07/23/14 1146  GLUCAP 105* 104* 109* 121* 108*     IMAGING STUDIES Ct Abdomen Pelvis W Contrast  07/19/2014   CLINICAL DATA:  Sudden onset nausea and vomiting  EXAM: CT ABDOMEN AND PELVIS WITH CONTRAST  TECHNIQUE: Multidetector CT imaging of the abdomen and pelvis was performed using the standard protocol following bolus administration of intravenous contrast.  CONTRAST:  113mL OMNIPAQUE IOHEXOL 300 MG/ML  SOLN  COMPARISON:  07/07/2014  FINDINGS: BODY WALL: Status post right chest wall surgery, partially visualized.  LOWER CHEST: Unremarkable.  ABDOMEN/PELVIS:  Liver: No metastatic focus identified.  Biliary: No evidence of biliary obstruction or stone.  Pancreas: Unremarkable.  Spleen: Unremarkable.  Adrenals: Unremarkable.  Kidneys and  ureters: Presumed sub cm cysts in the left upper pole. No hydronephrosis.  Bladder: Unremarkable.  Reproductive: Unremarkable.  Bowel: There is bowel dilatation and fluid filling above a transition point in the central abdomen on image 59. The more distal bowel is decompressed. This is in a region of previously described fat lesion with peripheral soft tissue rim, measuring up to 8 cm. As previously suggested, this structure favors a omental infarct. Status post resection of a gastric GIST. No abnormal soft tissue thickening in the region of the suture line. No evidence of bowel perforation or ischemia.  Retroperitoneum: No mass or adenopathy.  Peritoneum: No ascites or pneumoperitoneum.  Vascular: No acute abnormality.  OSSEOUS: No acute abnormalities.  IMPRESSION: High-grade small bowel obstruction. The transition point is near a probable omental infarct, suspect adhesion or internal hernia.   Electronically Signed   By: Jorje Guild M.D.   On: 07/19/2014 00:14   Ct Abdomen Pelvis W Contrast  07/07/2014   CLINICAL DATA:  Gastrointestinal stromal tumor followup. History of gastric surgery.  EXAM: CT ABDOMEN AND PELVIS WITH CONTRAST  TECHNIQUE: Multidetector CT imaging of the abdomen and pelvis was performed using the standard protocol following bolus administration of intravenous contrast.  CONTRAST:  170mL OMNIPAQUE IOHEXOL 300 MG/ML  SOLN  COMPARISON:  06/06/2013  FINDINGS: Lower chest:  Unremarkable  Hepatobiliary: Hypodense lesion favoring cyst in segment 6 of the liver, 0.4 cm in diameter on image 34 series 3. Mildly contracted gallbladder.  Pancreas: Unremarkable  Spleen: Unremarkable  Adrenals/Urinary Tract: Unremarkable  Stomach/Bowel: Postoperative findings observed on the inferior margin of the distal stomach body. The previously present large cystic mass extending caudad from the stomach is no longer present. Sigmoid colon diverticulosis noted.  Vascular/Lymphatic: Small gastrohepatic ligament  lymph nodes are not pathologically enlarged by size criteria. A right external iliac node measures 1 cm in diameter, formerly 0.9 cm  Reproductive: Unremarkable  Other: 7.9 by 5.7 by 6.4 cm region of marginal stranding outlining omental adipose tissue anteriorly in the pelvis, image 70 of series 3. Small bowel appears draped around this lesion. The prominence of adipose tissue in this region is new and this does not appear to be close enough that the colon to represent epiploic appendagitis. Faint adjacent stranding noted with some mild internal septation.  Musculoskeletal: Disc bulges at L3-4 and L4-5 with lumbar facet arthropathy particularly on the right at L3-4.  IMPRESSION: 1. Prior large cystic tumor extending caudad from the inferior margin of the stomach has been resected. There is a new 8 cm region of marginal stranding along prominent inferior omental adipose tissue. Small bowel drapes over this fatty lesion. This may represent an infarct of omental adipose tissue. This is less likely to represent a tumor deposit along the omentum given the central fat density and overall appearance, but likely merits observation. 2. Borderline enlarged right external iliac lymph node, admittedly is similar in size on the prior exam and possibly reactive. 3. Ancillary findings include  sigmoid diverticulosis and degenerative disc disease.   Electronically Signed   By: Sherryl Barters M.D.   On: 07/07/2014 09:48   Dg Abd 2 Views  07/23/2014   CLINICAL DATA:  Nausea vomiting today. The patient reports he was admitted for small bowel obstruction 5 days ago.  EXAM: ABDOMEN - 2 VIEW  COMPARISON:  July 20, 2014  FINDINGS: There are few dilated small bowel loops in the left abdomen. Air is identified the colon. There is no evidence of free air. No radio-opaque calculi or other significant radiographic abnormality is seen.  IMPRESSION: A few dilated small bowel loops in the left abdomen. Air is identified in the colon.  Findings could be due to small bowel ileus or developing early small bowel obstruction. Followup is recommended.   Electronically Signed   By: Abelardo Diesel M.D.   On: 07/23/2014 11:11   Dg Abd 2 Views  07/20/2014   CLINICAL DATA:  Follow-up small bowel obstruction.  EXAM: ABDOMEN - 2 VIEW  COMPARISON:  CT 07/18/2014  FINDINGS: NG tube tip and side-port project over the stomach. Relative paucity of small bowel gas. Gas is demonstrated within the colon. No definite pneumatosis, portal venous gas or free intraperitoneal air identified. Unremarkable osseous skeleton. High density within the gallbladder lumen and bladder likely secondary to contrast material.  IMPRESSION: NGT tip and side-port project over the stomach.  Paucity of small bowel gas.  Distal gas within the colon.   Electronically Signed   By: Lovey Newcomer M.D.   On: 07/20/2014 08:51    DISCHARGE EXAMINATION: See progress note from earlier today.  DISPOSITION: Home with wife  Discharge Instructions    Call MD for:  difficulty breathing, headache or visual disturbances    Complete by:  As directed      Call MD for:  extreme fatigue    Complete by:  As directed      Call MD for:  persistant dizziness or light-headedness    Complete by:  As directed      Call MD for:  persistant nausea and vomiting    Complete by:  As directed      Call MD for:  severe uncontrolled pain    Complete by:  As directed      Diet - low sodium heart healthy    Complete by:  As directed      Discharge instructions    Complete by:  As directed   Please follow up with [name] in next 7 days or with PCP.     Increase activity slowly    Complete by:  As directed            ALLERGIES:  Allergies  Allergen Reactions  . Nicoderm [Nicotine] Rash     Current Discharge Medication List    START taking these medications   Details  docusate sodium (COLACE) 100 MG capsule Take 1 capsule (100 mg total) by mouth 2 (two) times daily. Qty: 60 capsule,  Refills: 0    polyethylene glycol (MIRALAX / GLYCOLAX) packet Take 17 g by mouth daily as needed for moderate constipation. Qty: 30 each, Refills: 0      CONTINUE these medications which have NOT CHANGED   Details  imatinib (GLEEVEC) 100 MG tablet Take 3 tablets (300 mg total) by mouth daily. Take with meals and large glass of water.Caution:Chemotherapy Qty: 90 tablet, Refills: 1   Associated Diagnoses: Gastrointestinal stromal tumor of stomach    omeprazole (PRILOSEC) 20 MG  capsule Take 20 mg by mouth daily.        Follow-up Information    Follow up with [name], MD. Schedule an appointment as soon as possible for a visit in 1 week.   Specialty:  General Surgery   Why:  post hospitalization follow up   Contact information:   [address] [place] [address] [phone]       Follow up with Sherrie Mustache, MD.   Specialty:  Family Medicine   Why:  As needed   Contact information:   [place] [address] [phone]       Follow up with Betsy Coder, MD.   Specialty:  Oncology   Why:  as scheduled   Contact information:   [address] [place] [address] [phone]       TOTAL DISCHARGE TIME: 35 mins.  [facility] Pager [phone]  07/23/2014, 2:59 PM

## 2014-07-23 NOTE — Discharge Instructions (Signed)
Small Bowel Obstruction °A small bowel obstruction is a blockage (obstruction) of the small intestine (small bowel). The small bowel is a long, slender tube that connects the stomach to the colon. Its job is to absorb nutrients from the fluids and foods you consume into the bloodstream.  °CAUSES  °There are many causes of intestinal blockage. The most common ones include: °· Hernias. This is a more common cause in children than adults. °· Inflammatory bowel disease (enteritis and colitis). °· Twisting of the bowel (volvulus). °· Tumors. °· Scar tissue (adhesions) from previous surgery or radiation treatment. °· Recent surgery. This may cause an acute small bowel obstruction called an ileus. °SYMPTOMS  °· Abdominal pain. This may be dull cramps or sharp pain. It may occur in one area or may be present in the entire abdomen. Pain can range from mild to severe, depending on the degree of obstruction. °· Nausea and vomiting. Vomit may be greenish or yellow bile color. °· Distended or swollen stomach. Abdominal bloating is a common symptom. °· Constipation. °· Lack of passing gas. °· Frequent belching. °· Diarrhea. This may occur if runny stool is able to leak around the obstruction. °DIAGNOSIS  °Your caregiver can usually diagnose small bowel obstruction by taking a history, doing a physical exam, and taking X-rays. If the cause is unclear, a CT scan (computerized tomography) of your abdomen and pelvis may be needed. °TREATMENT  °Treatment of the blockage depends on the cause and how bad the problem is.  °· Sometimes, the obstruction improves with bed rest and intravenous (IV) fluids. °· Resting the bowel is very important. This means following a simple diet. Sometimes, a clear liquid diet may be required for several days. °· Sometimes, a small tube (nasogastric tube) is placed into the stomach to decompress the bowel. When the bowel is blocked, it usually swells up like a balloon filled with air and fluids.  Decompression means that the air and fluids are removed by suction through that tube. This can help with pain, discomfort, and nausea. It can also help the obstruction resolve faster. °· Surgery may be required if other treatments do not work. Bowel obstruction from a hernia may require early surgery and can be an emergency procedure. Adhesions that cause frequent or severe obstructions may also require surgery. °HOME CARE INSTRUCTIONS °If your bowel obstruction is only partial or incomplete, you may be allowed to go home. °· Get plenty of rest. °· Follow your diet as directed by your caregiver. °· Only consume clear liquids until your condition improves. °· Avoid solid foods as instructed. °SEEK IMMEDIATE MEDICAL CARE IF: °· You have increased pain or cramping. °· You vomit blood. °· You have uncontrolled vomiting or nausea. °· You cannot drink fluids due to vomiting or pain. °· You develop confusion. °· You begin feeling very dry or thirsty (dehydrated). °· You have severe bloating. °· You have chills. °· You have a fever. °· You feel extremely weak or you faint. °MAKE SURE YOU: °· Understand these instructions. °· Will watch your condition. °· Will get help right away if you are not doing well or get worse. °Document Released: 09/23/2005 Document Revised: 09/29/2011 Document Reviewed: 09/20/2010 °ExitCare® Patient Information ©2015 [employer]. This information is not intended to replace advice given to you by your health care provider. Make sure you discuss any questions you have with your health care provider. ° °

## 2014-07-23 NOTE — Progress Notes (Signed)
Patient ID: WALI REINHEIMER, male   DOB: October 02, 1955, 59 y.o.   MRN: [id number]  General Surgery - [facility] - Progress Note  Subjective: Patient developed abd pain and nausea after eating chef salad yesterday.  One episode of emesis this AM.  Passing flatus and had formed BM.  Now feels better with no abdominal pain.  Wants to go home.  Objective: Vital signs in last 24 hours: Temp:  [97.6 F (36.4 C)-98.3 F (36.8 C)] 98 F (36.7 C) (01/03 0535) Pulse Rate:  [69-74] 70 (01/03 0535) Resp:  [17-18] 18 (01/03 0535) BP: (136-146)/(67-73) 146/73 mmHg (01/03 0535) SpO2:  [94 %-99 %] 97 % (01/03 0535) Last BM Date: 07/22/14  Intake/Output from previous day: 01/02 0701 - 01/03 0700 In: 360 [P.O.:360] Out: -   Exam: HEENT - clear, not icteric Neck - soft Chest - clear bilaterally Cor - RRR, no murmur Abd - mild distension; BS present; no tenderness Ext - no significant edema Neuro - grossly intact, no focal deficits  Lab Results:   Recent Labs  07/21/14 0455 07/22/14 0530  WBC 7.6 6.6  HGB 12.0* 11.5*  HCT 37.2* 34.8*  PLT 260 238     Recent Labs  07/22/14 0530 07/23/14 0500  NA 139 142  K 3.6 4.1  CL 106 108  CO2 26 29  GLUCOSE 100* 133*  BUN 18 19  CREATININE 0.92 1.08  CALCIUM 8.1* 8.7    Studies/Results: No results found.  Assessment / Plan: 1.  Small bowel obstruction  Patient is clinically improved  Will get AXR now - hold off on diet orders until films reviewed  If AXR ~ normal, may discharge home later today  If AXR shows persistent signs of SBO, will revert to clear liquid diet and keep in-patient  Earnstine Regal, MD, [facility] Office: [phone]  07/23/2014

## 2014-07-23 NOTE — Progress Notes (Signed)
Discharge instructions and education given, patient verbalized understanding. 

## 2014-07-23 NOTE — Progress Notes (Signed)
[facility] PROGRESS NOTE  KISHAUN EREKSON KGS:[id number] DOB: 1955/08/28 DOA: 07/18/2014  PCP: Sherrie Mustache, MD  Brief HPI: 59 year old Caucasian male who presents with abdominal distention and pain, nausea. He is found to have a small bowel obstruction. He was admitted with NG tube. He was seen by general surgery.  Past medical history:  Past Medical History  Diagnosis Date  . GERD (gastroesophageal reflux disease)   . Vertigo   . Sleep apnea     Intolerant of CPAP  . History of alcoholism     Quit drinking in 1985  . Gastric neoplasm     Cystic, 18 cm - recently diagnosed October 2014   . Pneumonia age 67    hx of  . Heart murmur age 89  . Cancer   . Complication of anesthesia     slow to awaken  . Rash     upper back due to gleevac    Consultants: Gen. surgery  Procedures: NG tube placement. Removed on January 1  Antibiotics: None  Subjective: Patient developed abdominal pain last night after eating his dinner. And then this morning he had to vomit. Feels well otherwise. Denies any further nausea. Denies any abdominal pain. Did have a bowel movement this morning as well.   Objective: Vital Signs  Filed Vitals:   07/22/14 0610 07/22/14 1400 07/22/14 2131 07/23/14 0535  BP: 133/71 136/67 139/72 146/73  Pulse: 69 74 69 70  Temp: 98.6 F (37 C) 98.3 F (36.8 C) 97.6 F (36.4 C) 98 F (36.7 C)  TempSrc: Oral Oral Oral Oral  Resp: 20 18 17 18   Height:      Weight:      SpO2: 98% 99% 94% 97%    Intake/Output Summary (Last 24 hours) at 07/23/14 0957 Last data filed at 07/23/14 0920  Gross per 24 hour  Intake    360 ml  Output    300 ml  Net     60 ml   Filed Weights   07/19/14 0217  Weight: 83.1 kg (183 lb 3.2 oz)    General appearance: alert, cooperative, appears stated age and no distress Resp: clear to auscultation bilaterally Cardio: regular rate and rhythm, S1, S2 normal, no murmur, click, rub or gallop GI: soft but slightly  tense, nontender. Bowel sounds are present. No masses or organomegaly. Extremities: extremities normal, atraumatic, no cyanosis or edema  Lab Results:  Basic Metabolic Panel:  Recent Labs Lab 07/19/14 0312 07/20/14 0550 07/21/14 0455 07/22/14 0530 07/23/14 0500  NA 134* 139 141 139 142  K 3.8 3.4* 3.6 3.6 4.1  CL 97 105 107 106 108  CO2 26 28 26 26 29   GLUCOSE 135* 109* 94 100* 133*  BUN 17 19 20 18 19   CREATININE 1.08 1.07 1.00 0.92 1.08  CALCIUM 8.6 8.0* 8.2* 8.1* 8.7   Liver Function Tests:  Recent Labs Lab 07/18/14 2300 07/19/14 0312  AST 24 22  ALT 27 25  ALKPHOS 83 77  BILITOT 0.6 0.5  PROT 7.5 7.1  ALBUMIN 4.4 4.1    Recent Labs Lab 07/18/14 2300  LIPASE 36   CBC:  Recent Labs Lab 07/18/14 2300 07/19/14 0312 07/19/14 0625 07/19/14 1010 07/21/14 0455 07/22/14 0530  WBC 12.3* 10.6* 9.7 9.3 7.6 6.6  NEUTROABS 10.4*  --   --   --   --   --   HGB 14.1 13.4 13.0 12.9* 12.0* 11.5*  HCT 43.4 41.3 39.6 40.4 37.2* 34.8*  MCV  95.0 94.9 95.2 95.7 95.9 95.1  PLT 307 292 275 277 260 238   CBG:  Recent Labs Lab 07/22/14 0615 07/22/14 1251 07/22/14 1822 07/22/14 2331 07/23/14 0531  GLUCAP 87 105* 104* 109* 121*    Studies/Results: No results found.  Medications:  Scheduled: . antiseptic oral rinse  7 mL Mouth Rinse q12n4p  . chlorhexidine  15 mL Mouth Rinse BID  . pantoprazole (PROTONIX) IV  40 mg Intravenous Q12H   Continuous: . dextrose 5 % and 0.9% NaCl 75 mL/hr at 07/21/14 1013   PZW:CHENIDPOEUMPN **OR** acetaminophen, calcium carbonate, hydrALAZINE, morphine injection, ondansetron **OR** ondansetron (ZOFRAN) IV, phenol  Assessment/Plan:  Principal Problem:   SBO (small bowel obstruction) Active Problems:   Gastric neoplasm s/p partial gastrectomy 07/19/2013   Hematemesis    Small bowel obstruction Management per general surgery. He he was improving but had one episode of vomiting this morning. Had some abdominal discomfort  overnight after having supper. Repeat x-ray ordered by general surgery. Per surgery if the patient tolerates his lunch he should be able to go home later today.  Hematemesis No further episodes. Hemoglobin remains stable. Could have been a small Mallory-Weiss tear after his episodes of vomiting. Continue PPI for now. Patient recently had an EGD. No need to consult GI.  Elevated blood pressure Blood pressures have improved. Elevated BP was likely due to acute illness.   History of OSA Patient states he does not use his CPAP.  GIST status post resection Further management per oncology as outpatient. Can resume his Gleevec at discharge.  DVT Prophylaxis: SCDs Code Status: Full code  Family Communication: Discussed with the patient and his wife Disposition Plan: Improving. Continue to mobilize. Possible discharge later today.    LOS: 5 days   [facility] Pager [phone] 07/23/2014, 9:57 AM  If 7PM-7AM, please contact night-coverage at [email or web address], password [facility]

## 2014-07-23 NOTE — Progress Notes (Signed)
General Surgery [facility]  Abdominal film reviewed.  Few loops SB prominent in left abdomen, no obvious obstruction.  Will allow regular diet for lunch.  If tolerated, may be discharged this afternoon to home if OK with medical service.  Earnstine Regal, MD, [facility] Office: [phone]

## 2014-08-18 ENCOUNTER — Other Ambulatory Visit (HOSPITAL_BASED_OUTPATIENT_CLINIC_OR_DEPARTMENT_OTHER): Payer: BC Managed Care – PPO

## 2014-08-18 ENCOUNTER — Other Ambulatory Visit (INDEPENDENT_AMBULATORY_CARE_PROVIDER_SITE_OTHER): Payer: Self-pay | Admitting: General Surgery

## 2014-08-18 ENCOUNTER — Telehealth: Payer: Self-pay | Admitting: Oncology

## 2014-08-18 ENCOUNTER — Ambulatory Visit (HOSPITAL_BASED_OUTPATIENT_CLINIC_OR_DEPARTMENT_OTHER): Payer: BC Managed Care – PPO | Admitting: Oncology

## 2014-08-18 VITALS — BP 161/62 | HR 77 | Temp 98.3°F | Resp 18 | Ht 72.0 in | Wt 184.5 lb

## 2014-08-18 DIAGNOSIS — R21 Rash and other nonspecific skin eruption: Secondary | ICD-10-CM

## 2014-08-18 DIAGNOSIS — C49A2 Gastrointestinal stromal tumor of stomach: Secondary | ICD-10-CM

## 2014-08-18 DIAGNOSIS — D481 Neoplasm of uncertain behavior of connective and other soft tissue: Secondary | ICD-10-CM

## 2014-08-18 DIAGNOSIS — R609 Edema, unspecified: Secondary | ICD-10-CM

## 2014-08-18 LAB — COMPREHENSIVE METABOLIC PANEL (CC13)
ALT: 26 U/L (ref 0–55)
AST: 19 U/L (ref 5–34)
Albumin: 3.8 g/dL (ref 3.5–5.0)
Alkaline Phosphatase: 76 U/L (ref 40–150)
Anion Gap: 13 mEq/L — ABNORMAL HIGH (ref 3–11)
BUN: 16.5 mg/dL (ref 7.0–26.0)
CO2: 21 mEq/L — ABNORMAL LOW (ref 22–29)
CREATININE: 1.1 mg/dL (ref 0.7–1.3)
Calcium: 8.7 mg/dL (ref 8.4–10.4)
Chloride: 107 mEq/L (ref 98–109)
EGFR: 74 mL/min/{1.73_m2} — AB (ref >90)
Glucose: 146 mg/dl — ABNORMAL HIGH (ref 70–140)
POTASSIUM: 3.9 meq/L (ref 3.5–5.1)
Sodium: 141 mEq/L (ref 136–145)
TOTAL PROTEIN: 6.7 g/dL (ref 6.4–8.3)
Total Bilirubin: 0.32 mg/dL (ref 0.20–1.20)

## 2014-08-18 LAB — CBC WITH DIFFERENTIAL/PLATELET
BASO%: 0.9 % (ref 0.0–2.0)
BASOS ABS: 0.1 10*3/uL (ref 0.0–0.1)
EOS%: 3.4 % (ref 0.0–7.0)
Eosinophils Absolute: 0.3 10*3/uL (ref 0.0–0.5)
HCT: 41.2 % (ref 38.4–49.9)
HGB: 13.1 g/dL (ref 13.0–17.1)
LYMPH%: 24.9 % (ref 14.0–49.0)
MCH: 30.4 pg (ref 27.2–33.4)
MCHC: 31.8 g/dL — AB (ref 32.0–36.0)
MCV: 95.7 fL (ref 79.3–98.0)
MONO#: 0.4 10*3/uL (ref 0.1–0.9)
MONO%: 4.7 % (ref 0.0–14.0)
NEUT#: 4.9 10*3/uL (ref 1.5–6.5)
NEUT%: 66.1 % (ref 39.0–75.0)
PLATELETS: 255 10*3/uL (ref 140–400)
RBC: 4.31 10*6/uL (ref 4.20–5.82)
RDW: 13.8 % (ref 11.0–14.6)
WBC: 7.5 10*3/uL (ref 4.0–10.3)
lymph#: 1.9 10*3/uL (ref 0.9–3.3)

## 2014-08-18 MED ORDER — IMATINIB MESYLATE 100 MG PO TABS: 300.0000 mg | ORAL_TABLET | Freq: Every day | ORAL | Status: DC

## 2014-08-18 NOTE — Addendum Note (Signed)
Addended by: [name] on: 08/18/2014 05:16 PM   Modules accepted: Orders

## 2014-08-18 NOTE — Progress Notes (Signed)
Pt given a prescription for Gleevec refill. They will check to see if insurance covers it since it is now available in generic. Pt will call office if it is not covered, will need DAW Rx for pt assistance.

## 2014-08-18 NOTE — Telephone Encounter (Signed)
gv and printed appt sched and avs for pt for April  °

## 2014-08-18 NOTE — Progress Notes (Signed)
  [place] OFFICE PROGRESS NOTE   Diagnosis: Gastrointestinal stromal tumor  INTERVAL HISTORY:   Timothy Hall returns as scheduled. He was admitted 07/18/2014 with a small bowel obstruction. The obstruction resolved with bowel rest. A CT of the abdomen and pelvis on 07/18/2014 revealed a high-grade small bowel obstruction near a probable omental infarct. No evidence of recurrent gastrointestinal stromal tumor.  He reports no recurrent nausea and vomiting since discharge from the hospital. Gleevec was resumed. An upper endoscopy on 07/12/2014 revealed no evidence of recurrent tumor in the stomach. Objective:  Vital signs in last 24 hours:  Blood pressure 161/62, pulse 77, temperature 98.3 F (36.8 C), temperature source Oral, resp. rate 18, height 6' (1.829 m), weight 184 lb 8 oz (83.689 kg), SpO2 99 %.    HEENT: No thrush or ulcers Resp: Lungs clear bilaterally Cardio: Regular rate and rhythm GI: No hepatomegaly, nontender, no mass Vascular: No leg edema  Skin: Minimal fine papular rash at the upper back     Lab Results:  Lab Results  Component Value Date   WBC 7.5 08/18/2014   HGB 13.1 08/18/2014   HCT 41.2 08/18/2014   MCV 95.7 08/18/2014   PLT 255 08/18/2014   NEUTROABS 4.9 08/18/2014     Medications: I have reviewed the patient's current medications.  Assessment/Plan: 1. Gastrointestinal stromal tumor stage II (T4 NX), most likely arising from the stomach, status post surgical resection including a partial gastrectomy on 07/19/2013, 19 cm cystic mass with a low mitotic rate.  Initiation of adjuvant Gleevec 08/31/2013.   Gleevec dose reduced to 400 mg every other day beginning 10/18/2013 secondary to neutropenia.   Gleevec dose adjusted to 200 mg daily beginning 11/29/2013.   Gleevec dose adjusted to 300 mg daily beginning 12/13/2013.  Upper endoscopy 07/12/2014 without evidence of recurrent tumor in the stomach 2. Mild skin rash most likely  secondary to [place]. 3. Mild periorbital edema secondary to [place]. 4. Severe neutropenia secondary to [facility] 10/12/2013. Resolved. 5. History of Mild lower extremity edema likely related to [place]. 6. Admission 07/18/2014 with a small bowel obstruction-resolved with bowel rest, CT 07/18/2014 without evidence of recurrent tumor     Disposition:  He has recovered from the admission with a small bowel obstruction. There is no clinical evidence for progression of the gastric intestinal stromal tumor. Mr. Cafaro will continue [place]. He will return for an office visit in 2 months.  Betsy Coder, MD  08/18/2014  10:27 AM

## 2014-08-21 ENCOUNTER — Other Ambulatory Visit (INDEPENDENT_AMBULATORY_CARE_PROVIDER_SITE_OTHER): Payer: Self-pay | Admitting: *Deleted

## 2014-08-21 DIAGNOSIS — C49A2 Gastrointestinal stromal tumor of stomach: Secondary | ICD-10-CM

## 2014-09-04 ENCOUNTER — Other Ambulatory Visit: Payer: Self-pay | Admitting: *Deleted

## 2014-09-04 DIAGNOSIS — C49A2 Gastrointestinal stromal tumor of stomach: Secondary | ICD-10-CM

## 2014-09-04 MED ORDER — IMATINIB MESYLATE 100 MG PO TABS: 300.0000 mg | ORAL_TABLET | Freq: Every day | ORAL | Status: DC

## 2014-09-05 ENCOUNTER — Encounter: Payer: Self-pay | Admitting: Internal Medicine

## 2014-09-27 ENCOUNTER — Ambulatory Visit: Payer: BC Managed Care – PPO | Admitting: Gastroenterology

## 2014-09-29 ENCOUNTER — Ambulatory Visit: Payer: BC Managed Care – PPO | Admitting: Gastroenterology

## 2014-10-02 ENCOUNTER — Ambulatory Visit: Payer: BC Managed Care – PPO | Admitting: Gastroenterology

## 2014-10-27 ENCOUNTER — Telehealth: Payer: Self-pay | Admitting: Oncology

## 2014-10-27 ENCOUNTER — Encounter: Payer: Self-pay | Admitting: Gastroenterology

## 2014-10-27 ENCOUNTER — Ambulatory Visit (INDEPENDENT_AMBULATORY_CARE_PROVIDER_SITE_OTHER): Payer: BC Managed Care – PPO | Admitting: Gastroenterology

## 2014-10-27 ENCOUNTER — Other Ambulatory Visit: Payer: Self-pay

## 2014-10-27 ENCOUNTER — Other Ambulatory Visit (HOSPITAL_BASED_OUTPATIENT_CLINIC_OR_DEPARTMENT_OTHER): Payer: BC Managed Care – PPO

## 2014-10-27 ENCOUNTER — Ambulatory Visit (HOSPITAL_BASED_OUTPATIENT_CLINIC_OR_DEPARTMENT_OTHER): Payer: BC Managed Care – PPO | Admitting: Nurse Practitioner

## 2014-10-27 VITALS — BP 138/51 | HR 69 | Temp 98.0°F | Resp 19 | Ht 72.0 in | Wt 188.1 lb

## 2014-10-27 VITALS — BP 140/75 | HR 70 | Temp 98.1°F | Ht 72.0 in | Wt 187.9 lb

## 2014-10-27 DIAGNOSIS — R21 Rash and other nonspecific skin eruption: Secondary | ICD-10-CM | POA: Diagnosis not present

## 2014-10-27 DIAGNOSIS — C494 Malignant neoplasm of connective and soft tissue of abdomen: Secondary | ICD-10-CM

## 2014-10-27 DIAGNOSIS — Z1211 Encounter for screening for malignant neoplasm of colon: Secondary | ICD-10-CM

## 2014-10-27 DIAGNOSIS — D481 Neoplasm of uncertain behavior of connective and other soft tissue: Secondary | ICD-10-CM | POA: Diagnosis not present

## 2014-10-27 DIAGNOSIS — Z85028 Personal history of other malignant neoplasm of stomach: Secondary | ICD-10-CM

## 2014-10-27 DIAGNOSIS — C49A2 Gastrointestinal stromal tumor of stomach: Secondary | ICD-10-CM

## 2014-10-27 LAB — COMPREHENSIVE METABOLIC PANEL (CC13)
ALK PHOS: 90 U/L (ref 40–150)
ALT: 24 U/L (ref 0–55)
ANION GAP: 10 meq/L (ref 3–11)
AST: 19 U/L (ref 5–34)
Albumin: 3.7 g/dL (ref 3.5–5.0)
BUN: 14.8 mg/dL (ref 7.0–26.0)
CALCIUM: 8.6 mg/dL (ref 8.4–10.4)
CO2: 25 meq/L (ref 22–29)
Chloride: 106 mEq/L (ref 98–109)
Creatinine: 1.1 mg/dL (ref 0.7–1.3)
EGFR: 74 mL/min/{1.73_m2} — ABNORMAL LOW (ref >90)
Glucose: 119 mg/dl (ref 70–140)
Potassium: 3.8 mEq/L (ref 3.5–5.1)
Sodium: 141 mEq/L (ref 136–145)
TOTAL PROTEIN: 6.6 g/dL (ref 6.4–8.3)
Total Bilirubin: 0.27 mg/dL (ref 0.20–1.20)

## 2014-10-27 LAB — CBC WITH DIFFERENTIAL/PLATELET
BASO%: 0.1 % (ref 0.0–2.0)
BASOS ABS: 0 10*3/uL (ref 0.0–0.1)
EOS%: 3.7 % (ref 0.0–7.0)
Eosinophils Absolute: 0.3 10*3/uL (ref 0.0–0.5)
HCT: 40.7 % (ref 38.4–49.9)
HEMOGLOBIN: 13.5 g/dL (ref 13.0–17.1)
LYMPH%: 32 % (ref 14.0–49.0)
MCH: 31.3 pg (ref 27.2–33.4)
MCHC: 33.2 g/dL (ref 32.0–36.0)
MCV: 94.2 fL (ref 79.3–98.0)
MONO#: 0.5 10*3/uL (ref 0.1–0.9)
MONO%: 7.4 % (ref 0.0–14.0)
NEUT%: 56.8 % (ref 39.0–75.0)
NEUTROS ABS: 4.2 10*3/uL (ref 1.5–6.5)
Platelets: 244 10*3/uL (ref 140–400)
RBC: 4.32 10*6/uL (ref 4.20–5.82)
RDW: 13.3 % (ref 11.0–14.6)
WBC: 7.3 10*3/uL (ref 4.0–10.3)
lymph#: 2.4 10*3/uL (ref 0.9–3.3)

## 2014-10-27 MED ORDER — PEG 3350-KCL-NA BICARB-NACL 420 G PO SOLR: 4000.0000 mL | Freq: Once | ORAL | Status: DC

## 2014-10-27 MED ORDER — NYSTATIN 100000 UNIT/GM EX POWD: CUTANEOUS | Status: DC

## 2014-10-27 NOTE — Progress Notes (Signed)
Primary Care Physician:  Sherrie Mustache, MD  Primary Gastroenterologist:  Garfield Cornea, MD   Chief Complaint  Patient presents with  . Colonoscopy    HPI:  Timothy Hall is a 59 y.o. male diagnosed with gastrointestinal stromal tumor back in 2014 status post partial gastrectomy (Dr. Fanny Skates), maintained on [place]. Followed regularly by oncology. Presents to schedule first ever colonoscopy.    Extensive review of recent and prior medication records. EGD by Dr. [name] back in December 2015 for follow up regarding GIST. We could not get patient done by end of year so he requested Dr. [name] since he had done his EUS in 2014. EGD showed no evidence of recurrent GIST. Shortly after his EGD patient had admission for small bowel obstruction. CT scan in December showed high-grade small bowel obstruction with transition point near a probable omental infarct, suspect adhesion or internal hernia.  Small bowel obstruction resolved on its own. CT which was performed for follow-up of GIST just a couple weeks prior to this admission showed new 8 cm region of marginal stranding along prominent inferior omental adipose tissue. Small bowel draped over this fatty lesion. Felt could represent infarct of omental adipose tissue, less likely tumor deposit along the omentum.  Patient diagnosed with GIST in 2014 after presenting with abnormal CTA.    Currently patient states he feels well for the most part. Heartburn controlled with prilosec. Tolerates Gleevec. Vomiting only once after a meal over the last several months (since SBO), felt like he ate too much. Denies dysphagia. No constipation, diarrhea, melena, brbpr. Two episodes of right mid abd pain that radiates up to the neck, last for 10 minutes. Last episode 3 weeks ago. Not related to meals or movement. Nothing made it better, went away on its on. Nothing like what he had with the SBO.     Current Outpatient Prescriptions  Medication Sig  Dispense Refill  . imatinib (GLEEVEC) 100 MG tablet Take 3 tablets (300 mg total) by mouth daily. Take with meals and large glass of water.Caution:Chemotherapy 90 tablet 1  . omeprazole (PRILOSEC) 20 MG capsule Take 20 mg by mouth daily.      No current facility-administered medications for this visit.    Allergies as of 10/27/2014 - Review Complete 07/19/2014  Allergen Reaction Noted  . Nicoderm [nicotine] Rash 08/04/2013    Past Medical History  Diagnosis Date  . GERD (gastroesophageal reflux disease)   . Vertigo   . Sleep apnea     Intolerant of CPAP  . History of alcoholism     Quit drinking in 1985  . Gastric neoplasm     Cystic, 18 cm - recently diagnosed October 2014   . Pneumonia age 60    hx of  . Heart murmur age 65  . Cancer   . Complication of anesthesia     slow to awaken  . Rash     upper back due to gleevac    Past Surgical History  Procedure Laterality Date  . Tumor removal      Benign tumor removed from left breast and back area.   . Eus N/A 05/25/2013    Procedure: ESOPHAGEAL ENDOSCOPIC ULTRASOUND (EUS) RADIAL;  Surgeon: [name], MD;  Location: [facility];  Service: Endoscopy;  Laterality: N/A;  . Fine needle aspiration N/A 05/25/2013    Procedure: FINE NEEDLE ASPIRATION (FNA) LINEAR;  Surgeon: [name], MD;  Location: [facility];  Service: Endoscopy;  Laterality: N/A;"this part never happend"  .  Laparoscopic gastrectomy N/A 07/19/2013    Procedure: RESECTION OF RETROGASTRIC CYSTIC NEOPLASM WITH PARTIAL GASTRECTOMY;  Surgeon: [name], MD;  Location: [facility];  Service: General;  Laterality: N/A;  . Lipoma removed from back  yrs ago  . Breast tumor removed Right 1976  . Eus N/A 07/12/2014    EGD by Dr. [name]: small hh, widely patent Schatzki's ring. evidence of prior small gastric wedge resection along body of stomach. no evidence of recurrent tumor.     Family History  Problem Relation Age of Onset  . Colon cancer Neg Hx   .  Stomach cancer Neg Hx   . Breast cancer Mother   . CAD Brother     Premature disease    History   Social History  . Marital Status: Married    Spouse Name: N/A  . Number of Children: N/A  . Years of Education: N/A   Occupational History  . [employer] manager    Social History Main Topics  . Smoking status: Current Every Day Smoker -- 0.50 packs/day for 40 years    Types: Cigarettes  . Smokeless tobacco: Never Used     Comment: 1 to 1.5 packs per day- not currently smoking; trying to quit  . Alcohol Use: No     Comment: quit in 1985  . Drug Use: No     Comment: History of alcoholism  . Sexual Activity: Yes   Other Topics Concern  . Not on file   Social History Narrative   Married, wife [name]   Has #1 grown daughter and a grandson   Has a dog he is close to   Enjoys working on cars and fishing      ROS:  General: Negative for anorexia, weight loss, fever, chills, fatigue, weakness. Eyes: Negative for vision changes.  ENT: Negative for hoarseness, difficulty swallowing , nasal congestion. CV: Negative for chest pain, angina, palpitations, dyspnea on exertion, peripheral edema.  Respiratory: Negative for dyspnea at rest, dyspnea on exertion, cough, sputum, wheezing.  GI: See history of present illness. GU:  Negative for dysuria, hematuria, urinary incontinence, urinary frequency, nocturnal urination.  MS: Negative for joint pain, low back pain.  Derm: Negative for rash or itching.  Neuro: Negative for weakness, abnormal sensation, seizure, frequent headaches, memory loss, confusion.  Psych: Negative for anxiety, depression, suicidal ideation, hallucinations.  Endo: Negative for unusual weight change.  Heme: Negative for bruising or bleeding. Allergy: Negative for rash or hives.    Physical Examination:  There were no vitals taken for this visit.   General: Well-nourished, well-developed in no acute distress.  Head: Normocephalic, atraumatic.   Eyes:  Conjunctiva pink, no icterus. Mouth: Oropharyngeal mucosa moist and pink , no lesions erythema or exudate. Neck: Supple without thyromegaly, masses, or lymphadenopathy.  Lungs: Clear to auscultation bilaterally.  Heart: Regular rate and rhythm, no murmurs rubs or gallops.  Abdomen: Bowel sounds are normal, nontender, nondistended, no hepatosplenomegaly or masses, no abdominal bruits or    hernia , no rebound or guarding.  Well-healed incisions.  Rectal: not performed. Deferred to time of colonoscopy.  Extremities: No lower extremity edema. No clubbing or deformities.  Neuro: Alert and oriented x 4 , grossly normal neurologically.  Skin: Warm and dry, no rash or jaundice.   Psych: Alert and cooperative, normal mood and affect.  Labs: Lab Results  Component Value Date   WBC 7.5 08/18/2014   HGB 13.1 08/18/2014   HCT 41.2 08/18/2014   MCV 95.7  08/18/2014   PLT 255 08/18/2014   Lab Results  Component Value Date   CREATININE 1.1 08/18/2014   BUN 16.5 08/18/2014   NA 141 08/18/2014   K 3.9 08/18/2014   CL 108 07/23/2014   CO2 21* 08/18/2014   Lab Results  Component Value Date   ALT 26 08/18/2014   AST 19 08/18/2014   ALKPHOS 76 08/18/2014   BILITOT 0.32 08/18/2014     Imaging Studies: No results found.

## 2014-10-27 NOTE — Telephone Encounter (Signed)
Gave avs & calendar for May. °

## 2014-10-27 NOTE — Patient Instructions (Signed)
1. Colonoscopy with Dr. Rourk. See separate instructions.  

## 2014-10-27 NOTE — Progress Notes (Signed)
  [place] OFFICE PROGRESS NOTE   Diagnosis:  Gastrointestinal stromal tumor  INTERVAL HISTORY:   Timothy Hall returns as scheduled. He continues Gleevec 300 mg daily. He denies nausea/vomiting. No mouth sores. No diarrhea. Stable rash at the upper back and shoulders. He has a new pruritic rash at the upper gluteal fold. About 3 weeks ago he had 2 separate episodes of right-sided abdominal pain radiating to the bilateral neck regions. Each episode lasted about 10 minutes and then resolved without intervention. No associated symptoms such as shortness of breath, chest pain, nausea, diaphoresis. He thinks the pain may be related to not eating breakfast. He reports having similar symptoms prior to diagnosis of the gastrointestinal stromal tumor. His wife reports he underwent cardiac evaluation and at that time was found to have the gastrointestinal stromal tumor.  Objective:  Vital signs in last 24 hours:  Blood pressure 138/51, pulse 69, temperature 98 F (36.7 C), temperature source Oral, resp. rate 19, height 6' (1.829 m), weight 188 lb 1.6 oz (85.322 kg), SpO2 100 %.    HEENT: No thrush or ulcers. Lymphatics: No palpable cervical or supra-clavicular lymph nodes. Resp: Lungs clear bilaterally. Cardio: Regular rate and rhythm. GI: Abdomen soft and nontender. No hepatomegaly. No mass. Vascular: No leg edema.  Skin: Fine macular rash at the upper back. Erythematous rash at the upper gluteal fold, mildly excoriated in some areas.    Lab Results:  Lab Results  Component Value Date   WBC 7.3 10/27/2014   HGB 13.5 10/27/2014   HCT 40.7 10/27/2014   MCV 94.2 10/27/2014   PLT 244 10/27/2014   NEUTROABS 4.2 10/27/2014    Imaging:  No results found.  Medications: I have reviewed the patient's current medications.  Assessment/Plan: 1. Gastrointestinal stromal tumor stage II (T4 NX), most likely arising from the stomach, status post surgical resection including a  partial gastrectomy on 07/19/2013, 19 cm cystic mass with a low mitotic rate.  Initiation of adjuvant Gleevec 08/31/2013.   Gleevec dose reduced to 400 mg every other day beginning 10/18/2013 secondary to neutropenia.   Gleevec dose adjusted to 200 mg daily beginning 11/29/2013.   Gleevec dose adjusted to 300 mg daily beginning 12/13/2013.  Upper endoscopy 07/12/2014 without evidence of recurrent tumor in the stomach 2. Mild skin rash most likely secondary to [place]. 3. Mild periorbital edema secondary to [place]. 4. Severe neutropenia secondary to [facility] 10/12/2013. Resolved. 5. History of Mild lower extremity edema likely related to [place]. 6. Admission 07/18/2014 with a small bowel obstruction-resolved with bowel rest, CT 07/18/2014 without evidence of recurrent tumor.   Disposition: Timothy Hall appears stable. He remains in clinical remission from the gastrointestinal stromal tumor. He will continue Gleevec.  For the rash at the upper gluteal fold he will try nystatin powder twice a day for 7 days. If the rash does not resolve he will contact his PCP for evaluation.  The etiology of the abdominal pain radiating to the neck regions is unclear. He reports cardiac evaluation in the past was negative. Question reflux. He is currently on a proton pump inhibitor. He will try Maalox or Mylanta if the pain occurs again. We also instructed him to follow-up with his PCP.  Timothy Hall will return for a follow-up visit and labs in 8 weeks.  Plan reviewed with [name].  [name] ANP/GNP-BC   10/27/2014  11:01 AM

## 2014-10-30 ENCOUNTER — Encounter: Payer: Self-pay | Admitting: Gastroenterology

## 2014-10-30 NOTE — Progress Notes (Signed)
cc'ed to pcp °

## 2014-10-30 NOTE — Assessment & Plan Note (Signed)
Plans for first ever colonoscopy (screening).  I have discussed the risks, alternatives, benefits with regards to but not limited to the risk of reaction to medication, bleeding, infection, perforation and the patient is agreeable to proceed. Written consent to be obtained.

## 2014-10-30 NOTE — Assessment & Plan Note (Signed)
GIST diagnosed in 2014, followed by Dr. Fanny Skates and oncology. Maintained on Gleevec. Recent EGD by Dr. [name] with no evidence of recurrence.  Patient had a CT scan December 18 which showed new 8 cm region of marginal stranding along prominent inferior omental adipose tissue. Small bowel draped over this fatty lesion. Felt could represent infarct of omental adipose tissue, less likely tumor deposit along the omentum. Couple weeks later he presented with a small bowel obstruction and repeat CT scan at that time showed high-grade small bowel obstruction with transition point near a probable omental infarct, suspect adhesion or internal hernia. SBO resolved on its own.  Patient reports to me that [name] plans on a follow-up CT scan in a couple of months.

## 2014-11-20 ENCOUNTER — Encounter (HOSPITAL_COMMUNITY): Payer: Self-pay | Admitting: *Deleted

## 2014-11-20 ENCOUNTER — Encounter (HOSPITAL_COMMUNITY)
Admission: RE | Disposition: A | Discharge status: Home / Self Care | Payer: Self-pay | Source: Ambulatory Visit | Attending: Internal Medicine

## 2014-11-20 ENCOUNTER — Ambulatory Visit (HOSPITAL_COMMUNITY)
Admission: RE | Admit: 2014-11-20 | Discharge: 2014-11-20 | Disposition: A | Discharge status: Home / Self Care | Payer: BC Managed Care – PPO | Source: Ambulatory Visit | Attending: Internal Medicine | Admitting: Internal Medicine

## 2014-11-20 DIAGNOSIS — Z1211 Encounter for screening for malignant neoplasm of colon: Secondary | ICD-10-CM

## 2014-11-20 DIAGNOSIS — Z85028 Personal history of other malignant neoplasm of stomach: Secondary | ICD-10-CM | POA: Diagnosis not present

## 2014-11-20 DIAGNOSIS — K219 Gastro-esophageal reflux disease without esophagitis: Secondary | ICD-10-CM | POA: Diagnosis not present

## 2014-11-20 DIAGNOSIS — Z79899 Other long term (current) drug therapy: Secondary | ICD-10-CM | POA: Insufficient documentation

## 2014-11-20 DIAGNOSIS — G4730 Sleep apnea, unspecified: Secondary | ICD-10-CM | POA: Insufficient documentation

## 2014-11-20 DIAGNOSIS — K621 Rectal polyp: Secondary | ICD-10-CM | POA: Diagnosis not present

## 2014-11-20 DIAGNOSIS — K5730 Diverticulosis of large intestine without perforation or abscess without bleeding: Secondary | ICD-10-CM | POA: Diagnosis not present

## 2014-11-20 DIAGNOSIS — F17210 Nicotine dependence, cigarettes, uncomplicated: Secondary | ICD-10-CM | POA: Insufficient documentation

## 2014-11-20 DIAGNOSIS — Z903 Acquired absence of stomach [part of]: Secondary | ICD-10-CM | POA: Insufficient documentation

## 2014-11-20 DIAGNOSIS — Z860100 Personal history of colon polyps, unspecified: Secondary | ICD-10-CM | POA: Insufficient documentation

## 2014-11-20 HISTORY — PX: COLONOSCOPY: SHX5424

## 2014-11-20 SURGERY — COLONOSCOPY
Anesthesia: Moderate Sedation

## 2014-11-20 MED ORDER — STERILE WATER FOR IRRIGATION IR SOLN
Status: DC | PRN
  Administered 2014-11-20: 11:00:00

## 2014-11-20 MED ORDER — MIDAZOLAM HCL 5 MG/5ML IJ SOLN
INTRAMUSCULAR | Status: AC
  Filled 2014-11-20: qty 10

## 2014-11-20 MED ORDER — ONDANSETRON HCL 4 MG/2ML IJ SOLN
INTRAMUSCULAR | Status: DC | PRN
  Administered 2014-11-20: 4 mg via INTRAVENOUS

## 2014-11-20 MED ORDER — MIDAZOLAM HCL 5 MG/5ML IJ SOLN
INTRAMUSCULAR | Status: DC | PRN
Start: 2014-11-20 — End: 2014-11-20
  Administered 2014-11-20: 2 mg via INTRAVENOUS
  Administered 2014-11-20: 1 mg via INTRAVENOUS
  Administered 2014-11-20: 2 mg via INTRAVENOUS

## 2014-11-20 MED ORDER — ONDANSETRON HCL 4 MG/2ML IJ SOLN
INTRAMUSCULAR | Status: AC
  Filled 2014-11-20: qty 2

## 2014-11-20 MED ORDER — MEPERIDINE HCL 100 MG/ML IJ SOLN
INTRAMUSCULAR | Status: DC | PRN
  Administered 2014-11-20: 50 mg via INTRAVENOUS
  Administered 2014-11-20: 25 mg via INTRAVENOUS

## 2014-11-20 MED ORDER — MEPERIDINE HCL 100 MG/ML IJ SOLN
INTRAMUSCULAR | Status: AC
  Filled 2014-11-20: qty 2

## 2014-11-20 MED ORDER — SODIUM CHLORIDE 0.9 % IV SOLN
INTRAVENOUS | Status: DC
  Administered 2014-11-20: 10:00:00 via INTRAVENOUS

## 2014-11-20 NOTE — Op Note (Signed)
[facility] [address] [place], [address]   COLONOSCOPY PROCEDURE REPORT  PATIENT: Timothy, Hall  MR#: [id number] BIRTHDATE: [birth year] , 63  yrs. old GENDER: male ENDOSCOPIST: R.  Garfield Cornea, MD FACP [facility] REFERRED OL:[id number] Edrick Oh, M.D. PROCEDURE DATE:  Dec 06, 2014 PROCEDURE:   Colonoscopy with biopsy INDICATIONS:First ever average risk colorectal cancer screening examination. MEDICATIONS: Versed 5 mg IV and Demerol 75 mg IV in divided doses. Zofran 4 mg IV. ASA CLASS:       Class III  CONSENT: The risks, benefits, alternatives and imponderables including but not limited to bleeding, perforation as well as the possibility of a missed lesion have been reviewed.  The potential for biopsy, lesion removal, etc. have also been discussed. Questions have been answered.  All parties agreeable.  Please see the history and physical in the medical record for more information.  DESCRIPTION OF PROCEDURE:   After the risks benefits and alternatives of the procedure were thoroughly explained, informed consent was obtained.  The digital rectal exam revealed no abnormalities of the rectum.   The EC-[id number] ([id number])  endoscope was introduced through the anus and advanced to the cecum, which was identified by both the appendix and ileocecal valve. No adverse events experienced.   The quality of the prep was adequate  The instrument was then slowly withdrawn as the colon was fully examined.      COLON FINDINGS: (1) diminutive polyp just inside the anal verge; otherwise, remainder of the rectal mucosa appeared normal.  Patient had scattered left-sided diverticula; the remainder of the colonic mucosa appeared normal.  The above-mentioned rectal polyp was cold biopsied/removed. Retroflexion was performed. .  Withdrawal time=8 minutes 0 seconds.  The scope was withdrawn and the procedure completed. COMPLICATIONS: There were no immediate complications.  ENDOSCOPIC  IMPRESSION: Single rectal polyp?"removed as described above. Colonic diverticulosis.  RECOMMENDATIONS: Follow up on pathology.  eSigned:  R. Garfield Cornea, MD Rosalita Chessman [facility] 12/06/2014 11:45 AM   cc:  CPT CODES: ICD CODES:  The ICD and CPT codes recommended by this software are interpretations from the data that the clinical staff has captured with the software.  The verification of the translation of this report to the ICD and CPT codes and modifiers is the sole responsibility of the health care institution and practicing physician where this report was generated.  [employer] will not be held responsible for the validity of the ICD and CPT codes included on this report.  AMA assumes no liability for data contained or not contained herein. CPT is a Designer, television/film set of the [employer].  PATIENT NAME:  Timothy, Hall MR#: [id number]

## 2014-11-20 NOTE — H&P (View-Only) (Signed)
Primary Care Physician:  Sherrie Mustache, MD  Primary Gastroenterologist:  Garfield Cornea, MD   Chief Complaint  Patient presents with  . Colonoscopy    HPI:  Timothy Hall is a 59 y.o. male diagnosed with gastrointestinal stromal tumor back in 2014 status post partial gastrectomy (Dr. Fanny Skates), maintained on [place]. Followed regularly by oncology. Presents to schedule first ever colonoscopy.    Extensive review of recent and prior medication records. EGD by Dr. [name] back in December 2015 for follow up regarding GIST. We could not get patient done by end of year so he requested Dr. [name] since he had done his EUS in 2014. EGD showed no evidence of recurrent GIST. Shortly after his EGD patient had admission for small bowel obstruction. CT scan in December showed high-grade small bowel obstruction with transition point near a probable omental infarct, suspect adhesion or internal hernia.  Small bowel obstruction resolved on its own. CT which was performed for follow-up of GIST just a couple weeks prior to this admission showed new 8 cm region of marginal stranding along prominent inferior omental adipose tissue. Small bowel draped over this fatty lesion. Felt could represent infarct of omental adipose tissue, less likely tumor deposit along the omentum.  Patient diagnosed with GIST in 2014 after presenting with abnormal CTA.    Currently patient states he feels well for the most part. Heartburn controlled with prilosec. Tolerates Gleevec. Vomiting only once after a meal over the last several months (since SBO), felt like he ate too much. Denies dysphagia. No constipation, diarrhea, melena, brbpr. Two episodes of right mid abd pain that radiates up to the neck, last for 10 minutes. Last episode 3 weeks ago. Not related to meals or movement. Nothing made it better, went away on its on. Nothing like what he had with the SBO.     Current Outpatient Prescriptions  Medication Sig  Dispense Refill  . imatinib (GLEEVEC) 100 MG tablet Take 3 tablets (300 mg total) by mouth daily. Take with meals and large glass of water.Caution:Chemotherapy 90 tablet 1  . omeprazole (PRILOSEC) 20 MG capsule Take 20 mg by mouth daily.      No current facility-administered medications for this visit.    Allergies as of 10/27/2014 - Review Complete 07/19/2014  Allergen Reaction Noted  . Nicoderm [nicotine] Rash 08/04/2013    Past Medical History  Diagnosis Date  . GERD (gastroesophageal reflux disease)   . Vertigo   . Sleep apnea     Intolerant of CPAP  . History of alcoholism     Quit drinking in 1985  . Gastric neoplasm     Cystic, 18 cm - recently diagnosed October 2014   . Pneumonia age 11    hx of  . Heart murmur age 46  . Cancer   . Complication of anesthesia     slow to awaken  . Rash     upper back due to gleevac    Past Surgical History  Procedure Laterality Date  . Tumor removal      Benign tumor removed from left breast and back area.   . Eus N/A 05/25/2013    Procedure: ESOPHAGEAL ENDOSCOPIC ULTRASOUND (EUS) RADIAL;  Surgeon: [name], MD;  Location: [facility];  Service: Endoscopy;  Laterality: N/A;  . Fine needle aspiration N/A 05/25/2013    Procedure: FINE NEEDLE ASPIRATION (FNA) LINEAR;  Surgeon: [name], MD;  Location: [facility];  Service: Endoscopy;  Laterality: N/A;"this part never happend"  .  Laparoscopic gastrectomy N/A 07/19/2013    Procedure: RESECTION OF RETROGASTRIC CYSTIC NEOPLASM WITH PARTIAL GASTRECTOMY;  Surgeon: [name], MD;  Location: [facility];  Service: General;  Laterality: N/A;  . Lipoma removed from back  yrs ago  . Breast tumor removed Right 1976  . Eus N/A 07/12/2014    EGD by Dr. [name]: small hh, widely patent Schatzki's ring. evidence of prior small gastric wedge resection along body of stomach. no evidence of recurrent tumor.     Family History  Problem Relation Age of Onset  . Colon cancer Neg Hx   .  Stomach cancer Neg Hx   . Breast cancer Mother   . CAD Brother     Premature disease    History   Social History  . Marital Status: Married    Spouse Name: N/A  . Number of Children: N/A  . Years of Education: N/A   Occupational History  . [employer] manager    Social History Main Topics  . Smoking status: Current Every Day Smoker -- 0.50 packs/day for 40 years    Types: Cigarettes  . Smokeless tobacco: Never Used     Comment: 1 to 1.5 packs per day- not currently smoking; trying to quit  . Alcohol Use: No     Comment: quit in 1985  . Drug Use: No     Comment: History of alcoholism  . Sexual Activity: Yes   Other Topics Concern  . Not on file   Social History Narrative   Married, wife [name]   Has #1 grown daughter and a grandson   Has a dog he is close to   Enjoys working on cars and fishing      ROS:  General: Negative for anorexia, weight loss, fever, chills, fatigue, weakness. Eyes: Negative for vision changes.  ENT: Negative for hoarseness, difficulty swallowing , nasal congestion. CV: Negative for chest pain, angina, palpitations, dyspnea on exertion, peripheral edema.  Respiratory: Negative for dyspnea at rest, dyspnea on exertion, cough, sputum, wheezing.  GI: See history of present illness. GU:  Negative for dysuria, hematuria, urinary incontinence, urinary frequency, nocturnal urination.  MS: Negative for joint pain, low back pain.  Derm: Negative for rash or itching.  Neuro: Negative for weakness, abnormal sensation, seizure, frequent headaches, memory loss, confusion.  Psych: Negative for anxiety, depression, suicidal ideation, hallucinations.  Endo: Negative for unusual weight change.  Heme: Negative for bruising or bleeding. Allergy: Negative for rash or hives.    Physical Examination:  There were no vitals taken for this visit.   General: Well-nourished, well-developed in no acute distress.  Head: Normocephalic, atraumatic.   Eyes:  Conjunctiva pink, no icterus. Mouth: Oropharyngeal mucosa moist and pink , no lesions erythema or exudate. Neck: Supple without thyromegaly, masses, or lymphadenopathy.  Lungs: Clear to auscultation bilaterally.  Heart: Regular rate and rhythm, no murmurs rubs or gallops.  Abdomen: Bowel sounds are normal, nontender, nondistended, no hepatosplenomegaly or masses, no abdominal bruits or    hernia , no rebound or guarding.  Well-healed incisions.  Rectal: not performed. Deferred to time of colonoscopy.  Extremities: No lower extremity edema. No clubbing or deformities.  Neuro: Alert and oriented x 4 , grossly normal neurologically.  Skin: Warm and dry, no rash or jaundice.   Psych: Alert and cooperative, normal mood and affect.  Labs: Lab Results  Component Value Date   WBC 7.5 08/18/2014   HGB 13.1 08/18/2014   HCT 41.2 08/18/2014   MCV 95.7  08/18/2014   PLT 255 08/18/2014   Lab Results  Component Value Date   CREATININE 1.1 08/18/2014   BUN 16.5 08/18/2014   NA 141 08/18/2014   K 3.9 08/18/2014   CL 108 07/23/2014   CO2 21* 08/18/2014   Lab Results  Component Value Date   ALT 26 08/18/2014   AST 19 08/18/2014   ALKPHOS 76 08/18/2014   BILITOT 0.32 08/18/2014     Imaging Studies: No results found.

## 2014-11-20 NOTE — Interval H&P Note (Signed)
History and Physical Interval Note:  11/20/2014 11:17 AM  Timothy Hall  has presented today for surgery, with the diagnosis of screening TCS, hx of stomach cancer  The various methods of treatment have been discussed with the patient and family. After consideration of risks, benefits and other options for treatment, the patient has consented to  Procedure(s) with comments: COLONOSCOPY (N/A) - 1030 - moved to 5/2 @ 10:30 as a surgical intervention .  The patient's history has been reviewed, patient examined, no change in status, stable for surgery.  I have reviewed the patient's chart and labs.  Questions were answered to the patient's satisfaction.     Timothy Hall  No change. First ever average risk screening colonoscopy. The risks, benefits, limitations, alternatives and imponderables have been reviewed with the patient. Questions have been answered. All parties are agreeable.

## 2014-11-20 NOTE — Discharge Instructions (Signed)
Colonoscopy Discharge Instructions  Read the instructions outlined below and refer to this sheet in the next few weeks. These discharge instructions provide you with general information on caring for yourself after you leave the hospital. Your doctor may also give you specific instructions. While your treatment has been planned according to the most current medical practices available, unavoidable complications occasionally occur. If you have any problems or questions after discharge, call Dr. Gala Romney at [phone]. ACTIVITY  You may resume your regular activity, but move at a slower pace for the next 24 hours.   Take frequent rest periods for the next 24 hours.   Walking will help get rid of the air and reduce the bloated feeling in your belly (abdomen).   No driving for 24 hours (because of the medicine (anesthesia) used during the test).    Do not sign any important legal documents or operate any machinery for 24 hours (because of the anesthesia used during the test).  NUTRITION  Drink plenty of fluids.   You may resume your normal diet as instructed by your doctor.   Begin with a light meal and progress to your normal diet. Heavy or fried foods are harder to digest and may make you feel sick to your stomach (nauseated).   Avoid alcoholic beverages for 24 hours or as instructed.  MEDICATIONS  You may resume your normal medications unless your doctor tells you otherwise.  WHAT YOU CAN EXPECT TODAY  Some feelings of bloating in the abdomen.   Passage of more gas than usual.   Spotting of blood in your stool or on the toilet paper.  IF YOU HAD POLYPS REMOVED DURING THE COLONOSCOPY:  No aspirin products for 7 days or as instructed.   No alcohol for 7 days or as instructed.   Eat a soft diet for the next 24 hours.  FINDING OUT THE RESULTS OF YOUR TEST Not all test results are available during your visit. If your test results are not back during the visit, make an appointment  with your caregiver to find out the results. Do not assume everything is normal if you have not heard from your caregiver or the medical facility. It is important for you to follow up on all of your test results.  SEEK IMMEDIATE MEDICAL ATTENTION IF:  You have more than a spotting of blood in your stool.   Your belly is swollen (abdominal distention).   You are nauseated or vomiting.   You have a temperature over 101.   You have abdominal pain or discomfort that is severe or gets worse throughout the day.     Diverticulosis and polyp information provided   Further recommendations to follow pending review of pathology report    Colon Polyps Polyps are lumps of extra tissue growing inside the body. Polyps can grow in the large intestine (colon). Most colon polyps are noncancerous (benign). However, some colon polyps can become cancerous over time. Polyps that are larger than a pea may be harmful. To be safe, caregivers remove and test all polyps. CAUSES  Polyps form when mutations in the genes cause your cells to grow and divide even though no more tissue is needed. RISK FACTORS There are a number of risk factors that can increase your chances of getting colon polyps. They include:  Being older than 50 years.  Family history of colon polyps or colon cancer.  Long-term colon diseases, such as colitis or Crohn disease.  Being overweight.  Smoking.  Being inactive.  Drinking too much alcohol. SYMPTOMS  Most small polyps do not cause symptoms. If symptoms are present, they may include:  Blood in the stool. The stool may look dark red or black.  Constipation or diarrhea that lasts longer than 1 week. DIAGNOSIS People often do not know they have polyps until their caregiver finds them during a regular checkup. Your caregiver can use 4 tests to check for polyps:  Digital rectal exam. The caregiver wears gloves and feels inside the rectum. This test would find polyps only in  the rectum.  Barium enema. The caregiver puts a liquid called barium into your rectum before taking X-rays of your colon. Barium makes your colon look white. Polyps are dark, so they are easy to see in the X-ray pictures.  Sigmoidoscopy. A thin, flexible tube (sigmoidoscope) is placed into your rectum. The sigmoidoscope has a light and tiny camera in it. The caregiver uses the sigmoidoscope to look at the last third of your colon.  Colonoscopy. This test is like sigmoidoscopy, but the caregiver looks at the entire colon. This is the most common method for finding and removing polyps. TREATMENT  Any polyps will be removed during a sigmoidoscopy or colonoscopy. The polyps are then tested for cancer. PREVENTION  To help lower your risk of getting more colon polyps:  Eat plenty of fruits and vegetables. Avoid eating fatty foods.  Do not smoke.  Avoid drinking alcohol.  Exercise every day.  Lose weight if recommended by your caregiver.  Eat plenty of calcium and folate. Foods that are rich in calcium include milk, cheese, and broccoli. Foods that are rich in folate include chickpeas, kidney beans, and spinach. HOME CARE INSTRUCTIONS Keep all follow-up appointments as directed by your caregiver. You may need periodic exams to check for polyps. SEEK MEDICAL CARE IF: You notice bleeding during a bowel movement. Document Released: 04/02/2004 Document Revised: 09/29/2011 Document Reviewed: 09/16/2011 [facility] Patient Information 2015 [place], [place]. This information is not intended to replace advice given to you by your health care provider. Make sure you discuss any questions you have with your health care provider.

## 2014-11-21 ENCOUNTER — Encounter (HOSPITAL_COMMUNITY): Payer: Self-pay | Admitting: Internal Medicine

## 2014-11-22 ENCOUNTER — Telehealth: Payer: Self-pay | Admitting: *Deleted

## 2014-11-22 ENCOUNTER — Encounter: Payer: Self-pay | Admitting: Internal Medicine

## 2014-11-22 NOTE — Telephone Encounter (Signed)
Called and informed patient's wife Timothy Hall) that there is nothing the patient needs to do after drinking after another person with Strep throat.  Also informed patient's wife that he would need to call his PCP if he started to show symptoms.  Per [name],NP.  Patient's wife verbalized understanding.

## 2014-12-15 ENCOUNTER — Ambulatory Visit (HOSPITAL_BASED_OUTPATIENT_CLINIC_OR_DEPARTMENT_OTHER): Payer: BC Managed Care – PPO | Admitting: Oncology

## 2014-12-15 ENCOUNTER — Other Ambulatory Visit (HOSPITAL_BASED_OUTPATIENT_CLINIC_OR_DEPARTMENT_OTHER): Payer: BC Managed Care – PPO

## 2014-12-15 ENCOUNTER — Telehealth: Payer: Self-pay | Admitting: Oncology

## 2014-12-15 VITALS — BP 133/64 | HR 65 | Temp 97.8°F | Resp 18 | Ht 72.0 in | Wt 181.9 lb

## 2014-12-15 DIAGNOSIS — R21 Rash and other nonspecific skin eruption: Secondary | ICD-10-CM

## 2014-12-15 DIAGNOSIS — C494 Malignant neoplasm of connective and soft tissue of abdomen: Secondary | ICD-10-CM

## 2014-12-15 DIAGNOSIS — C49A2 Gastrointestinal stromal tumor of stomach: Secondary | ICD-10-CM

## 2014-12-15 LAB — CBC WITH DIFFERENTIAL/PLATELET
BASO%: 0.3 % (ref 0.0–2.0)
Basophils Absolute: 0 10*3/uL (ref 0.0–0.1)
EOS%: 4.3 % (ref 0.0–7.0)
Eosinophils Absolute: 0.3 10*3/uL (ref 0.0–0.5)
HEMATOCRIT: 40.4 % (ref 38.4–49.9)
HGB: 13.5 g/dL (ref 13.0–17.1)
LYMPH#: 2 10*3/uL (ref 0.9–3.3)
LYMPH%: 31.5 % (ref 14.0–49.0)
MCH: 31.7 pg (ref 27.2–33.4)
MCHC: 33.4 g/dL (ref 32.0–36.0)
MCV: 94.8 fL (ref 79.3–98.0)
MONO#: 0.5 10*3/uL (ref 0.1–0.9)
MONO%: 8.2 % (ref 0.0–14.0)
NEUT#: 3.6 10*3/uL (ref 1.5–6.5)
NEUT%: 55.7 % (ref 39.0–75.0)
PLATELETS: 245 10*3/uL (ref 140–400)
RBC: 4.26 10*6/uL (ref 4.20–5.82)
RDW: 13.4 % (ref 11.0–14.6)
WBC: 6.5 10*3/uL (ref 4.0–10.3)

## 2014-12-15 LAB — COMPREHENSIVE METABOLIC PANEL (CC13)
ALBUMIN: 3.9 g/dL (ref 3.5–5.0)
ALT: 22 U/L (ref 0–55)
AST: 24 U/L (ref 5–34)
Alkaline Phosphatase: 78 U/L (ref 40–150)
Anion Gap: 9 mEq/L (ref 3–11)
BILIRUBIN TOTAL: 0.59 mg/dL (ref 0.20–1.20)
BUN: 21.5 mg/dL (ref 7.0–26.0)
CHLORIDE: 106 meq/L (ref 98–109)
CO2: 23 mEq/L (ref 22–29)
Calcium: 8.6 mg/dL (ref 8.4–10.4)
Creatinine: 1.3 mg/dL (ref 0.7–1.3)
EGFR: 62 mL/min/{1.73_m2} — ABNORMAL LOW (ref >90)
Glucose: 119 mg/dl (ref 70–140)
Potassium: 4 mEq/L (ref 3.5–5.1)
SODIUM: 138 meq/L (ref 136–145)
Total Protein: 6.9 g/dL (ref 6.4–8.3)

## 2014-12-15 NOTE — Telephone Encounter (Signed)
Pt confirmed labs/ov per 05/27 POF, gave pt AVS and Calendar.... KJ °

## 2014-12-15 NOTE — Progress Notes (Signed)
  [place] OFFICE PROGRESS NOTE   Diagnosis: Gastrointestinal stromal tumor  INTERVAL HISTORY:   Mr. Haugan returns as scheduled. He continues [place]. He has intermittent cramps in the extremities. He had an episode of acute right abdominal pain recently. No swelling or diarrhea.  Objective:  Vital signs in last 24 hours:  Blood pressure 133/64, pulse 65, temperature 97.8 F (36.6 C), temperature source Oral, resp. rate 18, height 6' (1.829 m), weight 181 lb 14.4 oz (82.509 kg), SpO2 100 %.    HEENT: No thrush or ulcers Resp: Lungs with scattered bronchial sounds, good air movement bilaterally, no respiratory distress Cardio: Regular rate and rhythm GI: No hepatosplenomegaly, nontender, no mass Vascular: No leg edema  Skin: Plaque like area of dryness at the left lower back     Lab Results:  Lab Results  Component Value Date   WBC 6.5 12/15/2014   HGB 13.5 12/15/2014   HCT 40.4 12/15/2014   MCV 94.8 12/15/2014   PLT 245 12/15/2014   NEUTROABS 3.6 12/15/2014    Medications: I have reviewed the patient's current medications.  Assessment/Plan: 1. Gastrointestinal stromal tumor stage II (T4 NX), most likely arising from the stomach, status post surgical resection including a partial gastrectomy on 07/19/2013, 19 cm cystic mass with a low mitotic rate.  Initiation of adjuvant Gleevec 08/31/2013.   Gleevec dose reduced to 400 mg every other day beginning 10/18/2013 secondary to neutropenia.   Gleevec dose adjusted to 200 mg daily beginning 11/29/2013.   Gleevec dose adjusted to 300 mg daily beginning 12/13/2013.  Upper endoscopy 07/12/2014 without evidence of recurrent tumor in the stomach 2. Mild skin rash most likely secondary to [place]. 3. History of Mild periorbital edema secondary to [place]. 4. Severe neutropenia secondary to [facility] 10/12/2013. Resolved. 5. History of Mild lower extremity edema likely related to [place]. 6. Admission  07/18/2014 with a small bowel obstruction-resolved with bowel rest, CT 07/18/2014 without evidence of recurrent tumor.   Disposition:  Mr. Cullens remains in clinical remission from the gastrointestinal stromal tumor. He will continue Gleevec. He will return for a lab visit in 2 months and an office visit in 4 months.  Betsy Coder, MD  12/15/2014  8:54 AM

## 2015-01-10 ENCOUNTER — Telehealth: Payer: Self-pay | Admitting: Oncology

## 2015-01-10 NOTE — Telephone Encounter (Signed)
Patient wife called to reschedule 7/22 lab to 7/29. Wife has new d/t.

## 2015-02-09 ENCOUNTER — Other Ambulatory Visit: Payer: BC Managed Care – PPO

## 2015-02-13 ENCOUNTER — Other Ambulatory Visit: Payer: Self-pay | Admitting: Oncology

## 2015-02-16 ENCOUNTER — Ambulatory Visit
Admission: RE | Admit: 2015-02-16 | Discharge: 2015-02-16 | Disposition: A | Discharge status: Home / Self Care | Payer: BC Managed Care – PPO | Source: Ambulatory Visit | Attending: General Surgery | Admitting: General Surgery

## 2015-02-16 ENCOUNTER — Telehealth: Payer: Self-pay | Admitting: Oncology

## 2015-02-16 ENCOUNTER — Other Ambulatory Visit (HOSPITAL_BASED_OUTPATIENT_CLINIC_OR_DEPARTMENT_OTHER): Payer: BC Managed Care – PPO

## 2015-02-16 DIAGNOSIS — C494 Malignant neoplasm of connective and soft tissue of abdomen: Secondary | ICD-10-CM

## 2015-02-16 DIAGNOSIS — C49A2 Gastrointestinal stromal tumor of stomach: Secondary | ICD-10-CM

## 2015-02-16 LAB — COMPREHENSIVE METABOLIC PANEL (CC13)
ALT: 18 U/L (ref 0–55)
ANION GAP: 8 meq/L (ref 3–11)
AST: 19 U/L (ref 5–34)
Albumin: 3.7 g/dL (ref 3.5–5.0)
Alkaline Phosphatase: 91 U/L (ref 40–150)
BUN: 13.2 mg/dL (ref 7.0–26.0)
CO2: 22 mEq/L (ref 22–29)
Calcium: 8.4 mg/dL (ref 8.4–10.4)
Chloride: 108 mEq/L (ref 98–109)
Creatinine: 1 mg/dL (ref 0.7–1.3)
EGFR: 79 mL/min/{1.73_m2} — AB (ref >90)
GLUCOSE: 125 mg/dL (ref 70–140)
POTASSIUM: 3.6 meq/L (ref 3.5–5.1)
Sodium: 138 mEq/L (ref 136–145)
TOTAL PROTEIN: 6.5 g/dL (ref 6.4–8.3)
Total Bilirubin: 0.27 mg/dL (ref 0.20–1.20)

## 2015-02-16 LAB — CBC WITH DIFFERENTIAL/PLATELET
BASO%: 0.2 % (ref 0.0–2.0)
Basophils Absolute: 0 10*3/uL (ref 0.0–0.1)
EOS ABS: 0.4 10*3/uL (ref 0.0–0.5)
EOS%: 4.5 % (ref 0.0–7.0)
HEMATOCRIT: 37.5 % — AB (ref 38.4–49.9)
HEMOGLOBIN: 12.6 g/dL — AB (ref 13.0–17.1)
LYMPH%: 29.8 % (ref 14.0–49.0)
MCH: 31.8 pg (ref 27.2–33.4)
MCHC: 33.6 g/dL (ref 32.0–36.0)
MCV: 94.7 fL (ref 79.3–98.0)
MONO#: 0.5 10*3/uL (ref 0.1–0.9)
MONO%: 6.4 % (ref 0.0–14.0)
NEUT#: 4.9 10*3/uL (ref 1.5–6.5)
NEUT%: 59.1 % (ref 39.0–75.0)
Platelets: 240 10*3/uL (ref 140–400)
RBC: 3.96 10*6/uL — ABNORMAL LOW (ref 4.20–5.82)
RDW: 13.2 % (ref 11.0–14.6)
WBC: 8.3 10*3/uL (ref 4.0–10.3)
lymph#: 2.5 10*3/uL (ref 0.9–3.3)

## 2015-02-16 MED ORDER — IOPAMIDOL (ISOVUE-300) INJECTION 61%
100.0000 mL | Freq: Once | INTRAVENOUS | Status: AC | PRN
  Administered 2015-02-16: 100 mL via INTRAVENOUS

## 2015-02-16 NOTE — Telephone Encounter (Signed)
Pt wife came in to r/s Sept appt...done....gv and printed new sched

## 2015-02-16 NOTE — Telephone Encounter (Signed)
Gave adn printed appt sched and avs for pt for Sept °

## 2015-04-13 ENCOUNTER — Ambulatory Visit: Payer: BC Managed Care – PPO | Admitting: Nurse Practitioner

## 2015-04-13 ENCOUNTER — Other Ambulatory Visit: Payer: BC Managed Care – PPO

## 2015-04-19 ENCOUNTER — Other Ambulatory Visit: Payer: Self-pay | Admitting: Oncology

## 2015-04-20 ENCOUNTER — Ambulatory Visit (HOSPITAL_BASED_OUTPATIENT_CLINIC_OR_DEPARTMENT_OTHER): Payer: BC Managed Care – PPO | Admitting: Nurse Practitioner

## 2015-04-20 ENCOUNTER — Other Ambulatory Visit (HOSPITAL_BASED_OUTPATIENT_CLINIC_OR_DEPARTMENT_OTHER): Payer: BC Managed Care – PPO

## 2015-04-20 ENCOUNTER — Telehealth: Payer: Self-pay | Admitting: Oncology

## 2015-04-20 VITALS — BP 122/66 | HR 67 | Temp 98.4°F | Resp 18 | Ht 72.0 in | Wt 184.4 lb

## 2015-04-20 DIAGNOSIS — Z85028 Personal history of other malignant neoplasm of stomach: Secondary | ICD-10-CM

## 2015-04-20 DIAGNOSIS — C49A2 Gastrointestinal stromal tumor of stomach: Secondary | ICD-10-CM

## 2015-04-20 LAB — COMPREHENSIVE METABOLIC PANEL (CC13)
ALBUMIN: 3.9 g/dL (ref 3.5–5.0)
ALK PHOS: 76 U/L (ref 40–150)
ALT: 22 U/L (ref 0–55)
AST: 19 U/L (ref 5–34)
Anion Gap: 7 mEq/L (ref 3–11)
BILIRUBIN TOTAL: 0.52 mg/dL (ref 0.20–1.20)
BUN: 15.7 mg/dL (ref 7.0–26.0)
CO2: 23 meq/L (ref 22–29)
CREATININE: 1.1 mg/dL (ref 0.7–1.3)
Calcium: 9.1 mg/dL (ref 8.4–10.4)
Chloride: 110 mEq/L — ABNORMAL HIGH (ref 98–109)
EGFR: 72 mL/min/{1.73_m2} — ABNORMAL LOW (ref >90)
GLUCOSE: 107 mg/dL (ref 70–140)
Potassium: 4.3 mEq/L (ref 3.5–5.1)
SODIUM: 141 meq/L (ref 136–145)
TOTAL PROTEIN: 6.7 g/dL (ref 6.4–8.3)

## 2015-04-20 LAB — CBC WITH DIFFERENTIAL/PLATELET
BASO%: 0.1 % (ref 0.0–2.0)
BASOS ABS: 0 10*3/uL (ref 0.0–0.1)
EOS%: 4.3 % (ref 0.0–7.0)
Eosinophils Absolute: 0.3 10*3/uL (ref 0.0–0.5)
HCT: 41.3 % (ref 38.4–49.9)
HGB: 13.6 g/dL (ref 13.0–17.1)
LYMPH%: 29.3 % (ref 14.0–49.0)
MCH: 31.9 pg (ref 27.2–33.4)
MCHC: 32.9 g/dL (ref 32.0–36.0)
MCV: 96.7 fL (ref 79.3–98.0)
MONO#: 0.6 10*3/uL (ref 0.1–0.9)
MONO%: 8.4 % (ref 0.0–14.0)
NEUT#: 4.2 10*3/uL (ref 1.5–6.5)
NEUT%: 57.9 % (ref 39.0–75.0)
Platelets: 277 10*3/uL (ref 140–400)
RBC: 4.27 10*6/uL (ref 4.20–5.82)
RDW: 13.5 % (ref 11.0–14.6)
WBC: 7.2 10*3/uL (ref 4.0–10.3)
lymph#: 2.1 10*3/uL (ref 0.9–3.3)

## 2015-04-20 NOTE — Telephone Encounter (Signed)
Gave adn printed appt sched and avs for pt for NOV and Jan 2017

## 2015-04-20 NOTE — Progress Notes (Signed)
  [place] OFFICE PROGRESS NOTE   Diagnosis:  Gastrointestinal stromal tumor  INTERVAL HISTORY:   Mr. No returns as scheduled. He continues [place]. He overall feels well. He has a good appetite. No nausea or vomiting. No mouth sores. No diarrhea. Stable mild rash over the shoulders. No abdominal pain. No shortness of breath.  Objective:  Vital signs in last 24 hours:  Blood pressure 122/66, pulse 67, temperature 98.4 F (36.9 C), temperature source Oral, resp. rate 18, height 6' (1.829 m), weight 184 lb 6.4 oz (83.643 kg), SpO2 100 %.    HEENT: No thrush or ulcers. Resp: Lungs clear bilaterally. Cardio: Regular rate and rhythm. GI: Abdomen soft and nontender. No mass. No organomegaly. Vascular: No leg edema. Calves soft and nontender.  Lab Results:  Lab Results  Component Value Date   WBC 7.2 04/20/2015   HGB 13.6 04/20/2015   HCT 41.3 04/20/2015   MCV 96.7 04/20/2015   PLT 277 04/20/2015   NEUTROABS 4.2 04/20/2015    Imaging:  No results found.  Medications: I have reviewed the patient's current medications.  Assessment/Plan: 1. Gastrointestinal stromal tumor stage II (T4 NX), most likely arising from the stomach, status post surgical resection including a partial gastrectomy on 07/19/2013, 19 cm cystic mass with a low mitotic rate.  Initiation of adjuvant Gleevec 08/31/2013.   Gleevec dose reduced to 400 mg every other day beginning 10/18/2013 secondary to neutropenia.   Gleevec dose adjusted to 200 mg daily beginning 11/29/2013.   Gleevec dose adjusted to 300 mg daily beginning 12/13/2013.  Upper endoscopy 07/12/2014 without evidence of recurrent tumor in the stomach 2. Mild skin rash most likely secondary to [place]. 3. History of Mild periorbital edema secondary to [place]. 4. Severe neutropenia secondary to [facility] 10/12/2013. Resolved. 5. History of Mild lower extremity edema likely related to [place]. 6. Admission 07/18/2014  with a small bowel obstruction-resolved with bowel rest, CT 07/18/2014 without evidence of recurrent tumor. Follow-up CT 02/16/2015 with no evidence of gastric region mass or wall thickening. 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   Disposition: Timothy Hall appears stable. He remains in clinical remission from the gastrointestinal stromal tumor. He will continue Gleevec. He will return for a lab visit in 2 months and a follow-up visit in 4 months.  Plan reviewed with [name].    [name] ANP/GNP-BC   04/20/2015  9:23 AM

## 2015-04-20 NOTE — Telephone Encounter (Signed)
Gave and printed appt sched and avs for pt for NOV and Jan 2017

## 2015-05-30 ENCOUNTER — Encounter: Payer: Self-pay | Admitting: Oncology

## 2015-05-30 NOTE — Progress Notes (Signed)
I faxed to [facility] prior auth for gleevec   [phone]

## 2015-05-30 NOTE — Progress Notes (Signed)
I placed [employer]/ Gleevec prior auth form on desk of nurse for [name].

## 2015-06-04 ENCOUNTER — Other Ambulatory Visit: Payer: Self-pay | Admitting: *Deleted

## 2015-06-04 ENCOUNTER — Telehealth: Payer: Self-pay | Admitting: *Deleted

## 2015-06-04 NOTE — Telephone Encounter (Signed)
Message from pt's wife requesting flu shot while here for lab on 11/18. Order to schedulers for appointment. Pt asks when he should have a pneumonia vaccine. He had 23 valent 07/2013. Per [name]: we can give Prevnar at January visit. Pt made aware.

## 2015-06-05 ENCOUNTER — Telehealth: Payer: Self-pay | Admitting: Oncology

## 2015-06-05 NOTE — Telephone Encounter (Signed)
s.w pt wife and advised on 11.18 appt.Marland KitchenMarland KitchenMarland KitchenMarland Kitchenpt ok and aware of d.t

## 2015-06-06 ENCOUNTER — Telehealth: Payer: Self-pay | Admitting: *Deleted

## 2015-06-06 NOTE — Telephone Encounter (Signed)
Received fax from [facility]: Imatinib has been approved effective 04/30/15 through 05/29/18. Letter sent to HIM to be scanned.

## 2015-06-08 ENCOUNTER — Ambulatory Visit (HOSPITAL_BASED_OUTPATIENT_CLINIC_OR_DEPARTMENT_OTHER): Payer: BC Managed Care – PPO

## 2015-06-08 ENCOUNTER — Other Ambulatory Visit (HOSPITAL_BASED_OUTPATIENT_CLINIC_OR_DEPARTMENT_OTHER): Payer: BC Managed Care – PPO

## 2015-06-08 VITALS — BP 156/66 | HR 75 | Temp 98.4°F

## 2015-06-08 DIAGNOSIS — C49A2 Gastrointestinal stromal tumor of stomach: Secondary | ICD-10-CM

## 2015-06-08 DIAGNOSIS — Z23 Encounter for immunization: Secondary | ICD-10-CM | POA: Diagnosis not present

## 2015-06-08 LAB — CBC WITH DIFFERENTIAL/PLATELET
BASO%: 0.5 % (ref 0.0–2.0)
Basophils Absolute: 0 10*3/uL (ref 0.0–0.1)
EOS%: 2.7 % (ref 0.0–7.0)
Eosinophils Absolute: 0.2 10*3/uL (ref 0.0–0.5)
HEMATOCRIT: 38.1 % — AB (ref 38.4–49.9)
HGB: 12.8 g/dL — ABNORMAL LOW (ref 13.0–17.1)
LYMPH#: 1.6 10*3/uL (ref 0.9–3.3)
LYMPH%: 19.8 % (ref 14.0–49.0)
MCH: 31.9 pg (ref 27.2–33.4)
MCHC: 33.6 g/dL (ref 32.0–36.0)
MCV: 94.9 fL (ref 79.3–98.0)
MONO#: 0.5 10*3/uL (ref 0.1–0.9)
MONO%: 6.2 % (ref 0.0–14.0)
NEUT#: 5.7 10*3/uL (ref 1.5–6.5)
NEUT%: 70.8 % (ref 39.0–75.0)
Platelets: 284 10*3/uL (ref 140–400)
RBC: 4.02 10*6/uL — ABNORMAL LOW (ref 4.20–5.82)
RDW: 13.6 % (ref 11.0–14.6)
WBC: 8.1 10*3/uL (ref 4.0–10.3)

## 2015-06-08 MED ORDER — INFLUENZA VAC SPLIT QUAD 0.5 ML IM SUSY
0.5000 mL | PREFILLED_SYRINGE | Freq: Once | INTRAMUSCULAR | Status: AC
Start: 2015-06-08 — End: 2015-06-08
  Administered 2015-06-08: 0.5 mL via INTRAMUSCULAR
  Filled 2015-06-08: qty 0.5

## 2015-06-15 ENCOUNTER — Other Ambulatory Visit: Payer: BC Managed Care – PPO

## 2015-07-31 ENCOUNTER — Telehealth: Payer: Self-pay | Admitting: *Deleted

## 2015-07-31 NOTE — Telephone Encounter (Signed)
Left message on voicemail for pt to call office. Received message from [facility] requesting a new prescription for Gleevec. Need to verify pt is changing pharmacies.  [facility]) [phone] ext [phone]. Fax [phone].

## 2015-08-01 ENCOUNTER — Other Ambulatory Visit: Payer: Self-pay | Admitting: *Deleted

## 2015-08-01 ENCOUNTER — Telehealth: Payer: Self-pay | Admitting: *Deleted

## 2015-08-01 MED ORDER — IMATINIB MESYLATE 100 MG PO TABS: ORAL_TABLET | ORAL | Status: DC

## 2015-08-01 NOTE — Telephone Encounter (Signed)
Per Request; notified pt's wife that re-fill request faxed to new pharmacy ([facility] [phone]) Pt's wife upset that pt can not continue to get chemo at local pharmacy as before (d/t insurance change)  "I work at [employer] and we've always got it there; it's just a racket so they will get the money; but I'll deal with it"

## 2015-08-01 NOTE — Telephone Encounter (Signed)
Voicemail: "We have new Prescription Benefits.  Prescription has to mailed from [facility].  They need his refill by noon today.  The fax number is [phone], Phone: [phone]."

## 2015-08-02 NOTE — Telephone Encounter (Signed)
Order faxed 08-01-2015

## 2015-08-09 ENCOUNTER — Other Ambulatory Visit: Payer: BC Managed Care – PPO

## 2015-08-09 ENCOUNTER — Ambulatory Visit: Payer: BC Managed Care – PPO | Admitting: Oncology

## 2015-08-17 ENCOUNTER — Telehealth: Payer: Self-pay | Admitting: Oncology

## 2015-08-17 ENCOUNTER — Other Ambulatory Visit (HOSPITAL_BASED_OUTPATIENT_CLINIC_OR_DEPARTMENT_OTHER): Payer: BC Managed Care – PPO

## 2015-08-17 ENCOUNTER — Ambulatory Visit (HOSPITAL_BASED_OUTPATIENT_CLINIC_OR_DEPARTMENT_OTHER): Payer: BC Managed Care – PPO | Admitting: Oncology

## 2015-08-17 VITALS — BP 129/67 | HR 71 | Temp 98.1°F | Resp 18 | Wt 186.3 lb

## 2015-08-17 DIAGNOSIS — R21 Rash and other nonspecific skin eruption: Secondary | ICD-10-CM

## 2015-08-17 DIAGNOSIS — C49A2 Gastrointestinal stromal tumor of stomach: Secondary | ICD-10-CM

## 2015-08-17 DIAGNOSIS — D490 Neoplasm of unspecified behavior of digestive system: Secondary | ICD-10-CM

## 2015-08-17 LAB — COMPREHENSIVE METABOLIC PANEL
ALBUMIN: 3.7 g/dL (ref 3.5–5.0)
ALK PHOS: 88 U/L (ref 40–150)
ALT: 20 U/L (ref 0–55)
AST: 21 U/L (ref 5–34)
Anion Gap: 8 mEq/L (ref 3–11)
BILIRUBIN TOTAL: 0.31 mg/dL (ref 0.20–1.20)
BUN: 17.2 mg/dL (ref 7.0–26.0)
CO2: 24 mEq/L (ref 22–29)
Calcium: 9.3 mg/dL (ref 8.4–10.4)
Chloride: 109 mEq/L (ref 98–109)
Creatinine: 1.3 mg/dL (ref 0.7–1.3)
EGFR: 60 mL/min/{1.73_m2} — ABNORMAL LOW (ref >90)
Glucose: 102 mg/dl (ref 70–140)
Potassium: 4.3 mEq/L (ref 3.5–5.1)
SODIUM: 141 meq/L (ref 136–145)
TOTAL PROTEIN: 6.9 g/dL (ref 6.4–8.3)

## 2015-08-17 LAB — CBC WITH DIFFERENTIAL/PLATELET
BASO%: 0.1 % (ref 0.0–2.0)
Basophils Absolute: 0 10*3/uL (ref 0.0–0.1)
EOS%: 3 % (ref 0.0–7.0)
Eosinophils Absolute: 0.2 10*3/uL (ref 0.0–0.5)
HCT: 39.1 % (ref 38.4–49.9)
HEMOGLOBIN: 12.9 g/dL — AB (ref 13.0–17.1)
LYMPH#: 1.8 10*3/uL (ref 0.9–3.3)
LYMPH%: 24.3 % (ref 14.0–49.0)
MCH: 31.2 pg (ref 27.2–33.4)
MCHC: 33 g/dL (ref 32.0–36.0)
MCV: 94.7 fL (ref 79.3–98.0)
MONO#: 0.5 10*3/uL (ref 0.1–0.9)
MONO%: 7 % (ref 0.0–14.0)
NEUT%: 65.6 % (ref 39.0–75.0)
NEUTROS ABS: 4.8 10*3/uL (ref 1.5–6.5)
Platelets: 272 10*3/uL (ref 140–400)
RBC: 4.13 10*6/uL — ABNORMAL LOW (ref 4.20–5.82)
RDW: 13.4 % (ref 11.0–14.6)
WBC: 7.3 10*3/uL (ref 4.0–10.3)

## 2015-08-17 NOTE — Progress Notes (Signed)
  [place] OFFICE PROGRESS NOTE   Diagnosis: Gastrointestinal stromal tumor  INTERVAL HISTORY:   Mr. Howden returns as scheduled. He continues [place]. He has intermittent cramps at the lower anterior chest. No diarrhea. Mild periorbital edema.  Objective:  Vital signs in last 24 hours:  Blood pressure 129/67, pulse 71, temperature 98.1 F (36.7 C), temperature source Oral, resp. rate 18, weight 186 lb 4.8 oz (84.505 kg), SpO2 99 %.    HEENT: No thrush or ulcers, trace periorbital edema Lymphatics: No cervical, supra-clavicular, axillary, or inguinal nodes Resp: Lungs clear bilaterally Cardio: Regular rate and rhythm GI: No hepatosplenomegaly, no mass Vascular: No leg edema  Skin: No rash, cracking of the right great toenail with a 2-3 mm blister at the medial aspect of the right great toe    Lab Results:  Lab Results  Component Value Date   WBC 7.3 08/17/2015   HGB 12.9* 08/17/2015   HCT 39.1 08/17/2015   MCV 94.7 08/17/2015   PLT 272 08/17/2015   NEUTROABS 4.8 08/17/2015     Medications: I have reviewed the patient's current medications.  Assessment/Plan: 1. Gastrointestinal stromal tumor stage II (T4 NX), most likely arising from the stomach, status post surgical resection including a partial gastrectomy on 07/19/2013, 19 cm cystic mass with a low mitotic rate.  Initiation of adjuvant Gleevec 08/31/2013.   Gleevec dose reduced to 400 mg every other day beginning 10/18/2013 secondary to neutropenia.   Gleevec dose adjusted to 200 mg daily beginning 11/29/2013.   Gleevec dose adjusted to 300 mg daily beginning 12/13/2013.  Upper endoscopy 07/12/2014 without evidence of recurrent tumor in the stomach 2. Mild skin rash most likely secondary to [place]. 3. History of Mild periorbital edema secondary to [place]. 4. Severe neutropenia secondary to [facility] 10/12/2013. Resolved. 5. History of Mild lower extremity edema likely related to  [place]. 6. Admission 07/18/2014 with a small bowel obstruction-resolved with bowel rest, CT 07/18/2014 without evidence of recurrent tumor. Follow-up CT 02/16/2015 with no evidence of gastric region mass or wall thickening. 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     Disposition:  Timothy Hall appears stable. He will continue Gleevec. He will return for a lab visit in 2 months and an office visit in 4 months.  Betsy Coder, MD  08/17/2015  9:30 AM

## 2015-08-17 NOTE — Telephone Encounter (Signed)
Scheduled patient appt per pof, avs report printed.  °

## 2015-10-12 ENCOUNTER — Other Ambulatory Visit (HOSPITAL_BASED_OUTPATIENT_CLINIC_OR_DEPARTMENT_OTHER): Payer: 59

## 2015-10-12 DIAGNOSIS — C49A2 Gastrointestinal stromal tumor of stomach: Secondary | ICD-10-CM

## 2015-10-12 DIAGNOSIS — D490 Neoplasm of unspecified behavior of digestive system: Secondary | ICD-10-CM

## 2015-10-12 LAB — CBC WITH DIFFERENTIAL/PLATELET
BASO%: 0.7 % (ref 0.0–2.0)
Basophils Absolute: 0 10*3/uL (ref 0.0–0.1)
EOS%: 3.3 % (ref 0.0–7.0)
Eosinophils Absolute: 0.2 10*3/uL (ref 0.0–0.5)
HEMATOCRIT: 38.6 % (ref 38.4–49.9)
HGB: 12.7 g/dL — ABNORMAL LOW (ref 13.0–17.1)
LYMPH#: 1.7 10*3/uL (ref 0.9–3.3)
LYMPH%: 23.5 % (ref 14.0–49.0)
MCH: 31.1 pg (ref 27.2–33.4)
MCHC: 32.9 g/dL (ref 32.0–36.0)
MCV: 94.3 fL (ref 79.3–98.0)
MONO#: 0.5 10*3/uL (ref 0.1–0.9)
MONO%: 6.2 % (ref 0.0–14.0)
NEUT#: 4.9 10*3/uL (ref 1.5–6.5)
NEUT%: 66.3 % (ref 39.0–75.0)
Platelets: 278 10*3/uL (ref 140–400)
RBC: 4.09 10*6/uL — ABNORMAL LOW (ref 4.20–5.82)
RDW: 14 % (ref 11.0–14.6)
WBC: 7.3 10*3/uL (ref 4.0–10.3)

## 2015-10-12 LAB — COMPREHENSIVE METABOLIC PANEL
ALT: 16 U/L (ref 0–55)
AST: 17 U/L (ref 5–34)
Albumin: 3.6 g/dL (ref 3.5–5.0)
Alkaline Phosphatase: 84 U/L (ref 40–150)
Anion Gap: 8 mEq/L (ref 3–11)
BUN: 19.6 mg/dL (ref 7.0–26.0)
CALCIUM: 8.8 mg/dL (ref 8.4–10.4)
CO2: 23 meq/L (ref 22–29)
CREATININE: 1.2 mg/dL (ref 0.7–1.3)
Chloride: 109 mEq/L (ref 98–109)
EGFR: 69 mL/min/{1.73_m2} — ABNORMAL LOW (ref >90)
GLUCOSE: 116 mg/dL (ref 70–140)
Potassium: 4.1 mEq/L (ref 3.5–5.1)
SODIUM: 141 meq/L (ref 136–145)
Total Bilirubin: 0.3 mg/dL (ref 0.20–1.20)
Total Protein: 6.9 g/dL (ref 6.4–8.3)

## 2015-11-02 ENCOUNTER — Encounter: Payer: Self-pay | Admitting: Oncology

## 2015-11-02 NOTE — Progress Notes (Signed)
Sent prior auth for gleevec-generic to [facility]- per wife insurance changed to [employer]

## 2015-11-02 NOTE — Progress Notes (Signed)
wife left mess ins is now [facility] and needs prior [place]

## 2015-11-05 ENCOUNTER — Encounter: Payer: Self-pay | Admitting: Oncology

## 2015-11-05 NOTE — Progress Notes (Signed)
Sent notes/labs to [facility] for appeal for imatinib mesylate

## 2015-11-09 ENCOUNTER — Telehealth: Payer: Self-pay

## 2015-11-09 NOTE — Telephone Encounter (Signed)
Wife received letter today 4/21, the letter is dated 11/03/15. [facility] did not call the pt.  It is a denial for gleevec from [facility]. The letter states the MD put "neoplasm of uncertain behavior of the stomach". The pt has GIST and the letter also states they would approve for GIST. Pt has dose for tonight and tomorrow and will be out of gleevec. There is a phone # where MD can discuss with pharmacist [phone]. The file ID# is [id number].

## 2015-11-12 ENCOUNTER — Encounter: Payer: Self-pay | Admitting: Oncology

## 2015-11-12 ENCOUNTER — Encounter: Payer: Self-pay | Admitting: Medical Oncology

## 2015-11-12 NOTE — Progress Notes (Signed)
PA Case [id number] is Approved. For further questions, call [phone].- - called and let patient know -he will let wife know

## 2015-11-12 NOTE — Telephone Encounter (Signed)
This needs to go to [facility]

## 2015-11-12 NOTE — Progress Notes (Signed)
PA Case [id number] is Approved. For further questions, call [phone]. I will let patient/wife know approved.

## 2015-11-12 NOTE — Progress Notes (Signed)
Per Chrissie Noa new auth needed to be done. He said to fax notes to [phone]. I advised him last one I used with confirmation. See prev notes

## 2015-12-03 ENCOUNTER — Telehealth: Payer: Self-pay | Admitting: *Deleted

## 2015-12-03 MED ORDER — IMATINIB MESYLATE 100 MG PO TABS: ORAL_TABLET | ORAL | Status: DC

## 2015-12-03 NOTE — Telephone Encounter (Signed)
Call from [facility] with [facility] requesting refill on Gleevec. Confirmed with pt's wife this is the correct pharmacy. OK to refill to complete 3 years, per [name]. Rx sent electronically.

## 2015-12-07 ENCOUNTER — Other Ambulatory Visit: Payer: Self-pay | Admitting: Oncology

## 2015-12-14 ENCOUNTER — Ambulatory Visit (HOSPITAL_BASED_OUTPATIENT_CLINIC_OR_DEPARTMENT_OTHER): Payer: 59 | Admitting: Nurse Practitioner

## 2015-12-14 ENCOUNTER — Encounter: Payer: Self-pay | Admitting: Nurse Practitioner

## 2015-12-14 ENCOUNTER — Other Ambulatory Visit (HOSPITAL_BASED_OUTPATIENT_CLINIC_OR_DEPARTMENT_OTHER): Payer: 59

## 2015-12-14 ENCOUNTER — Telehealth: Payer: Self-pay | Admitting: Oncology

## 2015-12-14 VITALS — BP 137/61 | HR 58 | Temp 97.9°F | Resp 18 | Ht 72.0 in | Wt 182.0 lb

## 2015-12-14 DIAGNOSIS — R21 Rash and other nonspecific skin eruption: Secondary | ICD-10-CM

## 2015-12-14 DIAGNOSIS — D490 Neoplasm of unspecified behavior of digestive system: Secondary | ICD-10-CM

## 2015-12-14 DIAGNOSIS — C49A2 Gastrointestinal stromal tumor of stomach: Secondary | ICD-10-CM | POA: Diagnosis not present

## 2015-12-14 LAB — CBC WITH DIFFERENTIAL/PLATELET
BASO%: 0.1 % (ref 0.0–2.0)
Basophils Absolute: 0 10*3/uL (ref 0.0–0.1)
EOS%: 3.8 % (ref 0.0–7.0)
Eosinophils Absolute: 0.3 10*3/uL (ref 0.0–0.5)
HCT: 39.5 % (ref 38.4–49.9)
HGB: 13 g/dL (ref 13.0–17.1)
LYMPH%: 20.5 % (ref 14.0–49.0)
MCH: 30.8 pg (ref 27.2–33.4)
MCHC: 32.9 g/dL (ref 32.0–36.0)
MCV: 93.6 fL (ref 79.3–98.0)
MONO#: 0.6 10*3/uL (ref 0.1–0.9)
MONO%: 7.6 % (ref 0.0–14.0)
NEUT#: 5.4 10*3/uL (ref 1.5–6.5)
NEUT%: 68 % (ref 39.0–75.0)
PLATELETS: 281 10*3/uL (ref 140–400)
RBC: 4.22 10*6/uL (ref 4.20–5.82)
RDW: 13.7 % (ref 11.0–14.6)
WBC: 7.9 10*3/uL (ref 4.0–10.3)
lymph#: 1.6 10*3/uL (ref 0.9–3.3)

## 2015-12-14 LAB — COMPREHENSIVE METABOLIC PANEL
ALK PHOS: 84 U/L (ref 40–150)
ALT: 12 U/L (ref 0–55)
ANION GAP: 8 meq/L (ref 3–11)
AST: 16 U/L (ref 5–34)
Albumin: 3.7 g/dL (ref 3.5–5.0)
BILIRUBIN TOTAL: 0.38 mg/dL (ref 0.20–1.20)
BUN: 14.5 mg/dL (ref 7.0–26.0)
CO2: 25 mEq/L (ref 22–29)
Calcium: 9.2 mg/dL (ref 8.4–10.4)
Chloride: 107 mEq/L (ref 98–109)
Creatinine: 1.2 mg/dL (ref 0.7–1.3)
EGFR: 69 mL/min/{1.73_m2} — AB (ref >90)
Glucose: 99 mg/dl (ref 70–140)
Potassium: 4.2 mEq/L (ref 3.5–5.1)
Sodium: 140 mEq/L (ref 136–145)
Total Protein: 7 g/dL (ref 6.4–8.3)

## 2015-12-14 NOTE — Progress Notes (Signed)
  [place] OFFICE PROGRESS NOTE   Diagnosis:  Gastrointestinal stromal tumor  INTERVAL HISTORY:   Timothy Hall returns as scheduled. He continues [place]. He denies nausea/vomiting. No diarrhea. No mouth sores. Stable "sandpaper" rash at the upper back. Intermittent periorbital edema. Ankles are "puffy" in the evenings. No abdominal pain. He notes a new rash at the left hip.  Objective:  Vital signs in last 24 hours:  Blood pressure 137/61, pulse 58, temperature 97.9 F (36.6 C), temperature source Oral, resp. rate 18, height 6' (1.829 m), weight 182 lb (82.555 kg), SpO2 100 %.    HEENT: No thrush or ulcers. Resp: Lungs clear bilaterally. Cardio: Regular rate and rhythm. GI: Abdomen soft and nontender. No hepatomegaly. No mass. Vascular: No leg edema. Skin: Slightly raised erythematous rash left hip. The rash has an irregular border with small scabbed area located centrally.   Lab Results:  Lab Results  Component Value Date   WBC 7.9 12/14/2015   HGB 13.0 12/14/2015   HCT 39.5 12/14/2015   MCV 93.6 12/14/2015   PLT 281 12/14/2015   NEUTROABS 5.4 12/14/2015    Imaging:  No results found.  Medications: I have reviewed the patient's current medications.  Assessment/Plan: 1. Gastrointestinal stromal tumor stage II (T4 NX), most likely arising from the stomach, status post surgical resection including a partial gastrectomy on 07/19/2013, 19 cm cystic mass with a low mitotic rate.  Initiation of adjuvant Gleevec 08/31/2013.   Gleevec dose reduced to 400 mg every other day beginning 10/18/2013 secondary to neutropenia.   Gleevec dose adjusted to 200 mg daily beginning 11/29/2013.   Gleevec dose adjusted to 300 mg daily beginning 12/13/2013.  Upper endoscopy 07/12/2014 without evidence of recurrent tumor in the stomach 2. Mild skin rash most likely secondary to [place]. 3. History of Mild periorbital edema secondary to [place]. 4. Severe neutropenia  secondary to [facility] 10/12/2013. Resolved. 5. History of Mild lower extremity edema likely related to [place]. 6. Admission 07/18/2014 with a small bowel obstruction-resolved with bowel rest, CT 07/18/2014 without evidence of recurrent tumor. Follow-up CT 02/16/2015 with no evidence of gastric region mass or wall thickening. 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   Disposition:Timothy Hall appears stable. He will continue Gleevec. He will return for a lab appointment in 2 months and follow-up visit in 4 months.  The rash at the left hip may be related to an insect bite. If the rash persists he will follow up with his PCP.    [name] ANP/GNP-BC   12/14/2015  9:23 AM

## 2015-12-14 NOTE — Telephone Encounter (Signed)
Gave and pritned appt sched and avs for pt for July and Sept

## 2016-02-08 ENCOUNTER — Other Ambulatory Visit (HOSPITAL_BASED_OUTPATIENT_CLINIC_OR_DEPARTMENT_OTHER): Payer: 59

## 2016-02-08 DIAGNOSIS — C49A2 Gastrointestinal stromal tumor of stomach: Secondary | ICD-10-CM

## 2016-02-08 LAB — COMPREHENSIVE METABOLIC PANEL
ALBUMIN: 3.6 g/dL (ref 3.5–5.0)
ALK PHOS: 84 U/L (ref 40–150)
ALT: 17 U/L (ref 0–55)
AST: 23 U/L (ref 5–34)
Anion Gap: 9 mEq/L (ref 3–11)
BILIRUBIN TOTAL: 0.34 mg/dL (ref 0.20–1.20)
BUN: 18.3 mg/dL (ref 7.0–26.0)
CO2: 23 meq/L (ref 22–29)
Calcium: 9 mg/dL (ref 8.4–10.4)
Chloride: 109 mEq/L (ref 98–109)
Creatinine: 1.1 mg/dL (ref 0.7–1.3)
EGFR: 71 mL/min/{1.73_m2} — ABNORMAL LOW (ref >90)
GLUCOSE: 100 mg/dL (ref 70–140)
Potassium: 4.1 mEq/L (ref 3.5–5.1)
SODIUM: 141 meq/L (ref 136–145)
TOTAL PROTEIN: 6.7 g/dL (ref 6.4–8.3)

## 2016-02-08 LAB — CBC WITH DIFFERENTIAL/PLATELET
BASO%: 0.7 % (ref 0.0–2.0)
Basophils Absolute: 0 10*3/uL (ref 0.0–0.1)
EOS ABS: 0.3 10*3/uL (ref 0.0–0.5)
EOS%: 4.5 % (ref 0.0–7.0)
HCT: 41 % (ref 38.4–49.9)
HEMOGLOBIN: 13.4 g/dL (ref 13.0–17.1)
LYMPH%: 26.4 % (ref 14.0–49.0)
MCH: 30.1 pg (ref 27.2–33.4)
MCHC: 32.7 g/dL (ref 32.0–36.0)
MCV: 92.2 fL (ref 79.3–98.0)
MONO#: 0.5 10*3/uL (ref 0.1–0.9)
MONO%: 7 % (ref 0.0–14.0)
NEUT%: 61.4 % (ref 39.0–75.0)
NEUTROS ABS: 4 10*3/uL (ref 1.5–6.5)
Platelets: 278 10*3/uL (ref 140–400)
RBC: 4.44 10*6/uL (ref 4.20–5.82)
RDW: 13.9 % (ref 11.0–14.6)
WBC: 6.5 10*3/uL (ref 4.0–10.3)
lymph#: 1.7 10*3/uL (ref 0.9–3.3)

## 2016-02-21 ENCOUNTER — Encounter: Payer: Self-pay | Admitting: Oncology

## 2016-03-28 ENCOUNTER — Telehealth: Payer: Self-pay | Admitting: Oncology

## 2016-03-28 NOTE — Telephone Encounter (Signed)
Patient notified of 04/04/2016 appointment changes. ° °

## 2016-04-04 ENCOUNTER — Ambulatory Visit: Payer: 59 | Admitting: Oncology

## 2016-04-04 ENCOUNTER — Other Ambulatory Visit: Payer: 59

## 2016-04-11 ENCOUNTER — Other Ambulatory Visit (HOSPITAL_BASED_OUTPATIENT_CLINIC_OR_DEPARTMENT_OTHER): Payer: 59

## 2016-04-11 ENCOUNTER — Telehealth: Payer: Self-pay | Admitting: Oncology

## 2016-04-11 ENCOUNTER — Ambulatory Visit (HOSPITAL_BASED_OUTPATIENT_CLINIC_OR_DEPARTMENT_OTHER): Payer: 59 | Admitting: Oncology

## 2016-04-11 ENCOUNTER — Other Ambulatory Visit: Payer: 59

## 2016-04-11 VITALS — BP 141/58 | HR 71 | Temp 98.2°F | Resp 17 | Ht 72.0 in | Wt 183.0 lb

## 2016-04-11 DIAGNOSIS — Z85028 Personal history of other malignant neoplasm of stomach: Secondary | ICD-10-CM

## 2016-04-11 DIAGNOSIS — Z23 Encounter for immunization: Secondary | ICD-10-CM | POA: Diagnosis not present

## 2016-04-11 DIAGNOSIS — D490 Neoplasm of unspecified behavior of digestive system: Secondary | ICD-10-CM

## 2016-04-11 DIAGNOSIS — C49A2 Gastrointestinal stromal tumor of stomach: Secondary | ICD-10-CM

## 2016-04-11 LAB — CBC WITH DIFFERENTIAL/PLATELET
BASO%: 0.5 % (ref 0.0–2.0)
Basophils Absolute: 0 10*3/uL (ref 0.0–0.1)
EOS%: 4.2 % (ref 0.0–7.0)
Eosinophils Absolute: 0.4 10*3/uL (ref 0.0–0.5)
HEMATOCRIT: 36.4 % — AB (ref 38.4–49.9)
HEMOGLOBIN: 12.2 g/dL — AB (ref 13.0–17.1)
LYMPH#: 1.7 10*3/uL (ref 0.9–3.3)
LYMPH%: 20.1 % (ref 14.0–49.0)
MCH: 30.8 pg (ref 27.2–33.4)
MCHC: 33.3 g/dL (ref 32.0–36.0)
MCV: 92.3 fL (ref 79.3–98.0)
MONO#: 0.6 10*3/uL (ref 0.1–0.9)
MONO%: 7 % (ref 0.0–14.0)
NEUT%: 68.2 % (ref 39.0–75.0)
NEUTROS ABS: 5.7 10*3/uL (ref 1.5–6.5)
PLATELETS: 275 10*3/uL (ref 140–400)
RBC: 3.95 10*6/uL — ABNORMAL LOW (ref 4.20–5.82)
RDW: 14 % (ref 11.0–14.6)
WBC: 8.3 10*3/uL (ref 4.0–10.3)

## 2016-04-11 LAB — COMPREHENSIVE METABOLIC PANEL
ALT: 15 U/L (ref 0–55)
ANION GAP: 9 meq/L (ref 3–11)
AST: 17 U/L (ref 5–34)
Albumin: 3.2 g/dL — ABNORMAL LOW (ref 3.5–5.0)
Alkaline Phosphatase: 86 U/L (ref 40–150)
BILIRUBIN TOTAL: 0.33 mg/dL (ref 0.20–1.20)
BUN: 15 mg/dL (ref 7.0–26.0)
CALCIUM: 9.1 mg/dL (ref 8.4–10.4)
CHLORIDE: 108 meq/L (ref 98–109)
CO2: 24 mEq/L (ref 22–29)
Creatinine: 1 mg/dL (ref 0.7–1.3)
EGFR: 86 mL/min/{1.73_m2} — ABNORMAL LOW (ref >90)
Glucose: 106 mg/dl (ref 70–140)
Potassium: 3.9 mEq/L (ref 3.5–5.1)
Sodium: 140 mEq/L (ref 136–145)
TOTAL PROTEIN: 6.6 g/dL (ref 6.4–8.3)

## 2016-04-11 MED ORDER — INFLUENZA VAC SPLIT QUAD 0.5 ML IM SUSY
0.5000 mL | PREFILLED_SYRINGE | Freq: Once | INTRAMUSCULAR | Status: AC
  Administered 2016-04-11: 0.5 mL via INTRAMUSCULAR
  Filled 2016-04-11: qty 0.5

## 2016-04-11 NOTE — Progress Notes (Signed)
  [place] OFFICE PROGRESS NOTE   Diagnosis: Gastrointestinal stromal tumor  Interval history: [name] returns as scheduled. He feels well. He reports intermittent episodes of pain at the mid upper abdomen near a hernia site. The pain last for a few minutes and resolved spontaneously. No diarrhea. No swelling.  Objective:  Vital signs in last 24 hours:  Blood pressure (!) 141/58, pulse 71, temperature 98.2 F (36.8 C), temperature source Oral, resp. rate 17, height 6' (1.829 m), weight 183 lb (83 kg), SpO2 100 %.    HEENT: Mild erythema at the palate, no thrush or ulcers Lymphatics: No cervical or supraclavicular nodes Resp: Lungs clear bilaterally Cardio: Regular rate and rhythm GI: No hepatosplenomegaly, no mass, small reducible incisional hernia, nontender Vascular: No leg edema  Skin: No rash    Lab Results:  Lab Results  Component Value Date   WBC 8.3 04/11/2016   HGB 12.2 (L) 04/11/2016   HCT 36.4 (L) 04/11/2016   MCV 92.3 04/11/2016   PLT 275 04/11/2016   NEUTROABS 5.7 04/11/2016     Medications: I have reviewed the patient's current medications.  Assessment/Plan: 1. Gastrointestinal stromal tumor stage II (T4 NX), most likely arising from the stomach, status post surgical resection including a partial gastrectomy on 07/19/2013, 19 cm cystic mass with a low mitotic rate.  Initiation of adjuvant Gleevec 08/31/2013.   Gleevec dose reduced to 400 mg every other day beginning 10/18/2013 secondary to neutropenia.   Gleevec dose adjusted to 200 mg daily beginning 11/29/2013.   Gleevec dose adjusted to 300 mg daily beginning 12/13/2013.  Upper endoscopy 07/12/2014 without evidence of recurrent tumor in the stomach 2. Mild skin rash most likely secondary to [place]. 3. History of Mild periorbital edema secondary to [place]. 4. Severe neutropenia secondary to [facility] 10/12/2013. Resolved. 5. History of Mild lower extremity edema likely  related to [place]. 6. Admission 07/18/2014 with a small bowel obstruction-resolved with bowel rest, CT 07/18/2014 without evidence of recurrent tumor. Follow-up CT 02/16/2015 with no evidence of gastric region mass or wall thickening. 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   Disposition:  Mr. Chavoya remains in clinical remission from the gastrointestinal stromal tumor. He will continue Gleevec. He will return for a lab visit in 2 months and an office visit in 4 months. He received an influenza vaccine today.  Betsy Coder, MD  04/11/2016  8:52 AM

## 2016-04-11 NOTE — Telephone Encounter (Signed)
Gave patient wife avs report and appointments for November and January

## 2016-05-12 ENCOUNTER — Emergency Department (HOSPITAL_COMMUNITY): Payer: 59

## 2016-05-12 ENCOUNTER — Encounter (HOSPITAL_COMMUNITY): Payer: Self-pay | Admitting: Family Medicine

## 2016-05-12 ENCOUNTER — Emergency Department (HOSPITAL_COMMUNITY)
Admission: EM | Admit: 2016-05-12 | Discharge: 2016-05-13 | Disposition: A | Discharge status: Home / Self Care | Payer: 59 | Attending: Emergency Medicine | Admitting: Emergency Medicine

## 2016-05-12 DIAGNOSIS — I10 Essential (primary) hypertension: Secondary | ICD-10-CM | POA: Insufficient documentation

## 2016-05-12 DIAGNOSIS — R109 Unspecified abdominal pain: Secondary | ICD-10-CM

## 2016-05-12 DIAGNOSIS — R197 Diarrhea, unspecified: Secondary | ICD-10-CM | POA: Diagnosis not present

## 2016-05-12 DIAGNOSIS — R1032 Left lower quadrant pain: Secondary | ICD-10-CM | POA: Insufficient documentation

## 2016-05-12 DIAGNOSIS — F17210 Nicotine dependence, cigarettes, uncomplicated: Secondary | ICD-10-CM | POA: Diagnosis not present

## 2016-05-12 DIAGNOSIS — C801 Malignant (primary) neoplasm, unspecified: Secondary | ICD-10-CM | POA: Insufficient documentation

## 2016-05-12 LAB — COMPREHENSIVE METABOLIC PANEL
ALBUMIN: 4 g/dL (ref 3.5–5.0)
ALK PHOS: 73 U/L (ref 38–126)
ALT: 19 U/L (ref 17–63)
ANION GAP: 7 (ref 5–15)
AST: 19 U/L (ref 15–41)
BUN: 14 mg/dL (ref 6–20)
CALCIUM: 8.9 mg/dL (ref 8.9–10.3)
CO2: 24 mmol/L (ref 22–32)
Chloride: 108 mmol/L (ref 101–111)
Creatinine, Ser: 1.01 mg/dL (ref 0.61–1.24)
GFR calc Af Amer: >60 mL/min (ref >60)
GFR calc non Af Amer: >60 mL/min (ref >60)
GLUCOSE: 107 mg/dL — AB (ref 65–99)
Potassium: 4.1 mmol/L (ref 3.5–5.1)
SODIUM: 139 mmol/L (ref 135–145)
Total Bilirubin: 0.4 mg/dL (ref 0.3–1.2)
Total Protein: 6.8 g/dL (ref 6.5–8.1)

## 2016-05-12 LAB — CBC
HCT: 38.4 % — ABNORMAL LOW (ref 39.0–52.0)
HEMOGLOBIN: 12.5 g/dL — AB (ref 13.0–17.0)
MCH: 30.6 pg (ref 26.0–34.0)
MCHC: 32.6 g/dL (ref 30.0–36.0)
MCV: 93.9 fL (ref 78.0–100.0)
Platelets: 287 10*3/uL (ref 150–400)
RBC: 4.09 MIL/uL — ABNORMAL LOW (ref 4.22–5.81)
RDW: 13.9 % (ref 11.5–15.5)
WBC: 11.8 10*3/uL — ABNORMAL HIGH (ref 4.0–10.5)

## 2016-05-12 LAB — URINALYSIS, ROUTINE W REFLEX MICROSCOPIC
BILIRUBIN URINE: NEGATIVE
GLUCOSE, UA: NEGATIVE mg/dL
HGB URINE DIPSTICK: NEGATIVE
Ketones, ur: NEGATIVE mg/dL
Leukocytes, UA: NEGATIVE
Nitrite: NEGATIVE
PH: 7 (ref 5.0–8.0)
Protein, ur: NEGATIVE mg/dL
SPECIFIC GRAVITY, URINE: 1.023 (ref 1.005–1.030)

## 2016-05-12 LAB — LIPASE, BLOOD: Lipase: 35 U/L (ref 11–51)

## 2016-05-12 MED ORDER — ONDANSETRON HCL 4 MG/2ML IJ SOLN
4.0000 mg | Freq: Once | INTRAMUSCULAR | Status: DC
  Filled 2016-05-12: qty 2

## 2016-05-12 MED ORDER — IOPAMIDOL (ISOVUE-300) INJECTION 61%
100.0000 mL | Freq: Once | INTRAVENOUS | Status: AC | PRN
  Administered 2016-05-12: 100 mL via INTRAVENOUS

## 2016-05-12 MED ORDER — FENTANYL CITRATE (PF) 100 MCG/2ML IJ SOLN
100.0000 ug | Freq: Once | INTRAMUSCULAR | Status: DC
  Filled 2016-05-12: qty 2

## 2016-05-12 NOTE — ED Notes (Signed)
Pt refused both Fentanyl and Zofran

## 2016-05-12 NOTE — ED Provider Notes (Signed)
[place] DEPT Provider Note   CSN: [id number] Arrival date & time: 05/12/16  2122     History   Chief Complaint Chief Complaint  Patient presents with  . Abdominal Pain    HPI Timothy Hall is a 60 y.o. male.   Abdominal Pain   This is a new problem. The current episode started 2 days ago. The problem occurs constantly. The problem has not changed since onset.The pain is associated with diet changes. The pain is located in the LLQ. The pain is moderate. Associated symptoms include diarrhea. Pertinent negatives include fever, nausea and vomiting.    Past Medical History:  Diagnosis Date  . Cancer ([place])   . Complication of anesthesia    slow to awaken  . Gastric neoplasm    Cystic, 18 cm - recently diagnosed October 2014   . GERD (gastroesophageal reflux disease)   . Heart murmur age 22  . History of alcoholism ([place])    Quit drinking in 1985  . Pneumonia age 89   hx of  . Rash    upper back due to gleevac  . Sleep apnea    Intolerant of CPAP    Patient Active Problem List   Diagnosis Date Noted  . Diverticulosis of colon without hemorrhage   . History of colonic polyps   . SBO (small bowel obstruction) 07/19/2014  . Hematemesis 07/19/2014  . Gastrointestinal stromal tumor of stomach ([place]) 08/18/2013  . History of chest pain 08/04/2013  . Smokes 1.5 packs of cigarettes per day 07/21/2013  . Sleep apnea   . Gastric neoplasm s/p partial gastrectomy 07/19/2013 07/19/2013  . Preoperative cardiovascular examination 06/01/2013  . Heart murmur 06/01/2013  . Encounter for screening colonoscopy 05/17/2013  . Dizziness and giddiness 05/14/2013  . Essential hypertension, benign 05/14/2013  . Benign paroxysmal positional vertigo 05/14/2013    Past Surgical History:  Procedure Laterality Date  . breast tumor removed Right 1976  . COLONOSCOPY N/A 11/20/2014   Procedure: COLONOSCOPY;  Surgeon: [name], MD;  Location: [facility] SUITE;  Service: Endoscopy;   Laterality: N/A;  1030 - moved to 5/2 @ 10:30  . EUS N/A 05/25/2013   Procedure: ESOPHAGEAL ENDOSCOPIC ULTRASOUND (EUS) RADIAL;  Surgeon: [name], MD;  Location: [facility] ENDOSCOPY;  Service: Endoscopy;  Laterality: N/A;  . EUS N/A 07/12/2014   EGD by Dr. [name]: small hh, widely patent Schatzki's ring. evidence of prior small gastric wedge resection along body of stomach. no evidence of recurrent tumor.   [name] FINE NEEDLE ASPIRATION N/A 05/25/2013   Procedure: FINE NEEDLE ASPIRATION (FNA) LINEAR;  Surgeon: [name], MD;  Location: [facility] ENDOSCOPY;  Service: Endoscopy;  Laterality: N/A;"this part never happend"  . LAPAROSCOPIC GASTRECTOMY N/A 07/19/2013   Procedure: RESECTION OF RETROGASTRIC CYSTIC NEOPLASM WITH PARTIAL GASTRECTOMY;  Surgeon: [name], MD;  Location: [facility] ORS;  Service: General;  Laterality: N/A;  . lipoma removed from back  yrs ago  . TUMOR REMOVAL     Benign tumor removed from left breast and back area.        Home Medications    Prior to Admission medications   Medication Sig Start Date End Date Taking? Authorizing Provider  docusate sodium (COLACE) 100 MG capsule Take 100 mg by mouth daily as needed for mild constipation.  07/23/14  Yes Historical Provider, MD  imatinib (GLEEVEC) 100 MG tablet TAKE THREE (3) TABLETS BY MOUTH DAILY. TAKE WITH MEALS AND A LARGE GLASS OF WATER. CAUTION: CHEMOTHERAPY. 12/03/15  Yes  Ladell Pier, MD  polyethylene glycol [facility] / GLYCOLAX) packet Take 17 g by mouth daily as needed for moderate constipation.  07/23/14  Yes Historical Provider, MD    Family History Family History  Problem Relation Age of Onset  . Breast cancer Mother   . CAD Brother     Premature disease  . Colon cancer Neg Hx   . Stomach cancer Neg Hx     Social History Social History  Substance Use Topics  . Smoking status: Current Every Day Smoker    Packs/day: 0.50    Years: 40.00    Types: Cigarettes  . Smokeless tobacco: Never Used  . Alcohol use No      Comment: quit in 1985     Allergies   Nicoderm [nicotine]   Review of Systems Review of Systems  Constitutional: Negative for chills and fever.  HENT: Negative for congestion.   Eyes: Negative for pain.  Gastrointestinal: Positive for abdominal pain and diarrhea. Negative for nausea and vomiting.  Endocrine: Negative for polydipsia and polyuria.  All other systems reviewed and are negative.    Physical Exam Updated Vital Signs BP 112/63 (BP Location: Left Arm)   Pulse 78   Temp 98.3 F (36.8 C) (Oral)   Resp 18   Ht 6' (1.829 m)   Wt 180 lb (81.6 kg)   SpO2 97%   BMI 24.41 kg/m   Physical Exam  Constitutional: He is oriented to person, place, and time. He appears well-developed and well-nourished.  HENT:  Head: Normocephalic and atraumatic.  Eyes: Conjunctivae and EOM are normal.  Neck: Normal range of motion.  Cardiovascular: Normal rate and regular rhythm.  Exam reveals no gallop and no friction rub.   Pulmonary/Chest: Effort normal. No respiratory distress. He has no wheezes.  Abdominal: He exhibits no distension. There is tenderness (LLQ with slight guarding).  Musculoskeletal: Normal range of motion.  Neurological: He is alert and oriented to person, place, and time. No cranial nerve deficit.  Nursing note and vitals reviewed.    ED Treatments / Results  Labs (all labs ordered are listed, but only abnormal results are displayed) Labs Reviewed  COMPREHENSIVE METABOLIC PANEL - Abnormal; Notable for the following:       Result Value   Glucose, Bld 107 (*)    All other components within normal limits  CBC - Abnormal; Notable for the following:    WBC 11.8 (*)    RBC 4.09 (*)    Hemoglobin 12.5 (*)    HCT 38.4 (*)    All other components within normal limits  LIPASE, BLOOD  URINALYSIS, ROUTINE W REFLEX MICROSCOPIC (NOT AT [facility])    EKG  EKG Interpretation None       Radiology Ct Abdomen Pelvis W Contrast  Result Date: 05/13/2016 CLINICAL  DATA:  Left lower abdominal pain EXAM: CT ABDOMEN AND PELVIS WITH CONTRAST TECHNIQUE: Multidetector CT imaging of the abdomen and pelvis was performed using the standard protocol following bolus administration of intravenous contrast. CONTRAST:  172mL ISOVUE-300 IOPAMIDOL (ISOVUE-300) INJECTION 61% COMPARISON:  02/16/2015 FINDINGS: Lower chest: Patchy dependent atelectasis. Lung bases are otherwise clear. Heart size is nonenlarged. No pericardial effusion. Hepatobiliary: Sub cm hypodense lesions within the right hepatic lobe unchanged, too small to further characterize. No new focal hepatic abnormalities are visualized. No calcified gallstones. No biliary dilatation. Pancreas: Unremarkable. No pancreatic ductal dilatation or surrounding inflammatory changes. Spleen: Calcified granuloma in the spleen. Spleen otherwise unremarkable. Adrenals/Urinary Tract: Adrenal glands within normal  limits. Stable hypodense cyst left kidney. No hydronephrosis. Mildly thick walled urinary bladder. Stomach/Bowel: Postsurgical changes of the distal stomach. No dilated small bowel. Stool in the colon. Multiple diverticula of the sigmoid colon with questionable minimal wall thickening. No inflammatory changes. Vascular/Lymphatic: Atherosclerotic vascular disease of the aorta. No aneurysm. Scattered mesenteric lymph nodes, sub cm, unchanged. Reproductive: Prostate is unremarkable. Other: Partially calcified fat density masslike area within the anterior upper pelvis measuring 5.1 x 6.4 cm, mildly decreased in size, now with slightly thickened peripheral wall and scattered calcifications. No free air or free fluid. Musculoskeletal: No acute osseous abnormality. IMPRESSION: 1. No CT evidence for acute intra-abdominal or pelvic pathology. 2. Sigmoid colon diverticulosis. No definite surrounding bowel inflammation. 3. Slight decreased size of fat density masslike area within the upper pelvis thought to represent omental infarct, scattered  peripheral calcifications are now present within the lesion. Electronically Signed   By: Donavan Foil M.D.   On: 05/13/2016 00:27    Procedures Procedures (including critical care time)  Medications Ordered in ED Medications  ondansetron (ZOFRAN) injection 4 mg (4 mg Intravenous Refused 05/13/16 0055)  fentaNYL (SUBLIMAZE) injection 100 mcg (100 mcg Intravenous Refused 05/13/16 0055)  iopamidol (ISOVUE-300) 61 % injection 100 mL (100 mLs Intravenous Contrast Given 05/12/16 2332)     Initial Impression / Assessment and Plan / ED Course  I have reviewed the triage vital signs and the nursing notes.  Pertinent labs & imaging results that were available during my care of the patient were reviewed by me and considered in my medical decision making (see chart for details).  Clinical Course   CT scan for diverticulitis/obstruction. More likely related to gas likely 2/2 recent GI illness. If CT ok, stable for dc.  Ct negative. Dc with symptomatic care.   Final Clinical Impressions(s) / ED Diagnoses   Final diagnoses:  Abdominal pain, unspecified abdominal location  Diarrhea, unspecified type    New Prescriptions Discharge Medication List as of 05/13/2016 12:34 AM       Merrily Pew, MD 05/13/16 [id number]

## 2016-05-12 NOTE — ED Triage Notes (Signed)
Pt complains of left lower abd pain that started Saturday throughout the day. Saturday night, developed diarrhea after eating a blooming onion at the [facility]. Patient is currently on chemotherapy for a gastrointestinal tumor. He has had 5%-10% of his stomach removed.

## 2016-05-12 NOTE — ED Notes (Signed)
Pt returned from CT at this time.  

## 2016-05-13 NOTE — ED Notes (Signed)
Pt given discharge instructions, verbalized understanding of need to follow up and reasons to return to the ED. Pt denies need for medications prior to discharge. Pt tolerating PO fluid well at this time. Pt states pain controlled 2/10. Pt able to change clothes and ambulate to exit without difficulty.

## 2016-05-13 NOTE — ED Notes (Signed)
Pt continues to refuse ordered medication at this time.

## 2016-05-23 ENCOUNTER — Telehealth: Payer: Self-pay | Admitting: *Deleted

## 2016-05-23 NOTE — Telephone Encounter (Signed)
Received fax from [facility], they have been unable to reach pt to arrange delivery of [place]. Called pt, requested he contact pharmacy. He voiced understanding.

## 2016-06-06 ENCOUNTER — Other Ambulatory Visit (HOSPITAL_BASED_OUTPATIENT_CLINIC_OR_DEPARTMENT_OTHER): Payer: 59

## 2016-06-06 DIAGNOSIS — Z85028 Personal history of other malignant neoplasm of stomach: Secondary | ICD-10-CM | POA: Diagnosis not present

## 2016-06-06 DIAGNOSIS — D490 Neoplasm of unspecified behavior of digestive system: Secondary | ICD-10-CM

## 2016-06-06 LAB — COMPREHENSIVE METABOLIC PANEL
ALBUMIN: 3.4 g/dL — AB (ref 3.5–5.0)
ALK PHOS: 95 U/L (ref 40–150)
ALT: 19 U/L (ref 0–55)
AST: 16 U/L (ref 5–34)
Anion Gap: 11 mEq/L (ref 3–11)
BUN: 14.6 mg/dL (ref 7.0–26.0)
CALCIUM: 9.3 mg/dL (ref 8.4–10.4)
CO2: 22 mEq/L (ref 22–29)
CREATININE: 1.1 mg/dL (ref 0.7–1.3)
Chloride: 108 mEq/L (ref 98–109)
EGFR: 77 mL/min/{1.73_m2} — ABNORMAL LOW (ref >90)
GLUCOSE: 127 mg/dL (ref 70–140)
Potassium: 3.9 mEq/L (ref 3.5–5.1)
SODIUM: 140 meq/L (ref 136–145)
Total Bilirubin: 0.22 mg/dL (ref 0.20–1.20)
Total Protein: 6.9 g/dL (ref 6.4–8.3)

## 2016-06-06 LAB — CBC WITH DIFFERENTIAL/PLATELET
BASO%: 0.8 % (ref 0.0–2.0)
Basophils Absolute: 0.1 10*3/uL (ref 0.0–0.1)
EOS ABS: 0.3 10*3/uL (ref 0.0–0.5)
EOS%: 3.7 % (ref 0.0–7.0)
HCT: 40.8 % (ref 38.4–49.9)
HGB: 13.4 g/dL (ref 13.0–17.1)
LYMPH%: 25.3 % (ref 14.0–49.0)
MCH: 30.3 pg (ref 27.2–33.4)
MCHC: 32.9 g/dL (ref 32.0–36.0)
MCV: 92.2 fL (ref 79.3–98.0)
MONO#: 0.5 10*3/uL (ref 0.1–0.9)
MONO%: 6.7 % (ref 0.0–14.0)
NEUT#: 5.1 10*3/uL (ref 1.5–6.5)
NEUT%: 63.5 % (ref 39.0–75.0)
PLATELETS: 292 10*3/uL (ref 140–400)
RBC: 4.42 10*6/uL (ref 4.20–5.82)
RDW: 13.8 % (ref 11.0–14.6)
WBC: 8.1 10*3/uL (ref 4.0–10.3)
lymph#: 2 10*3/uL (ref 0.9–3.3)

## 2016-07-08 ENCOUNTER — Telehealth: Payer: Self-pay | Admitting: Oncology

## 2016-07-08 NOTE — Telephone Encounter (Signed)
PER WIFE MOVED FROM LAB /FU FROM 1/26 TO 1/15 - WIFE HAS NEW DATE/TIME. SCHEDULED WITH LISA DUE TO 1/15 THE ONLY DAY WIFE CAN COME WITH PATIENT. WIFE GIVEN NEW SCHEDULE.

## 2016-08-04 ENCOUNTER — Other Ambulatory Visit (HOSPITAL_BASED_OUTPATIENT_CLINIC_OR_DEPARTMENT_OTHER): Payer: 59

## 2016-08-04 ENCOUNTER — Ambulatory Visit (HOSPITAL_BASED_OUTPATIENT_CLINIC_OR_DEPARTMENT_OTHER): Payer: 59 | Admitting: Nurse Practitioner

## 2016-08-04 ENCOUNTER — Telehealth: Payer: Self-pay | Admitting: Oncology

## 2016-08-04 VITALS — BP 158/68 | HR 76 | Temp 98.2°F | Resp 18 | Ht 72.0 in | Wt 186.4 lb

## 2016-08-04 DIAGNOSIS — C49A2 Gastrointestinal stromal tumor of stomach: Secondary | ICD-10-CM

## 2016-08-04 DIAGNOSIS — D490 Neoplasm of unspecified behavior of digestive system: Secondary | ICD-10-CM

## 2016-08-04 LAB — CBC WITH DIFFERENTIAL/PLATELET
BASO%: 0.3 % (ref 0.0–2.0)
Basophils Absolute: 0 10*3/uL (ref 0.0–0.1)
EOS%: 4.2 % (ref 0.0–7.0)
Eosinophils Absolute: 0.3 10*3/uL (ref 0.0–0.5)
HEMATOCRIT: 39.9 % (ref 38.4–49.9)
HEMOGLOBIN: 13.3 g/dL (ref 13.0–17.1)
LYMPH#: 2 10*3/uL (ref 0.9–3.3)
LYMPH%: 27.4 % (ref 14.0–49.0)
MCH: 30.6 pg (ref 27.2–33.4)
MCHC: 33.3 g/dL (ref 32.0–36.0)
MCV: 91.9 fL (ref 79.3–98.0)
MONO#: 0.6 10*3/uL (ref 0.1–0.9)
MONO%: 7.7 % (ref 0.0–14.0)
NEUT%: 60.4 % (ref 39.0–75.0)
NEUTROS ABS: 4.3 10*3/uL (ref 1.5–6.5)
Platelets: 292 10*3/uL (ref 140–400)
RBC: 4.34 10*6/uL (ref 4.20–5.82)
RDW: 14.2 % (ref 11.0–14.6)
WBC: 7.1 10*3/uL (ref 4.0–10.3)

## 2016-08-04 LAB — COMPREHENSIVE METABOLIC PANEL
ALT: 23 U/L (ref 0–55)
ANION GAP: 11 meq/L (ref 3–11)
AST: 20 U/L (ref 5–34)
Albumin: 3.8 g/dL (ref 3.5–5.0)
Alkaline Phosphatase: 104 U/L (ref 40–150)
BILIRUBIN TOTAL: 0.36 mg/dL (ref 0.20–1.20)
BUN: 15.9 mg/dL (ref 7.0–26.0)
CALCIUM: 9.4 mg/dL (ref 8.4–10.4)
CHLORIDE: 105 meq/L (ref 98–109)
CO2: 25 mEq/L (ref 22–29)
CREATININE: 1.1 mg/dL (ref 0.7–1.3)
EGFR: 76 mL/min/{1.73_m2} — ABNORMAL LOW (ref >90)
Glucose: 120 mg/dl (ref 70–140)
Potassium: 3.9 mEq/L (ref 3.5–5.1)
Sodium: 141 mEq/L (ref 136–145)
TOTAL PROTEIN: 7.4 g/dL (ref 6.4–8.3)

## 2016-08-04 NOTE — Telephone Encounter (Signed)
Appointments scheduled per 08/04/16 los. Patient was given a copy of the AVS report and appointment schedule, per 08/04/16 los.  °

## 2016-08-04 NOTE — Progress Notes (Signed)
  [place] OFFICE PROGRESS NOTE   Diagnosis:  Gastrointestinal stromal tumor  INTERVAL HISTORY:   Mr. Orduno returns for follow-up. He continues [place]. He denies nausea/vomiting. No mouth sores. No diarrhea. He continues to have periodic abdominal cramps. He also notes some cramping in his feet. He has a pruritic rash on the left lower leg. His wife is concerned the rash is "yeast".  Objective:  Vital signs in last 24 hours:  Blood pressure (!) 158/68, pulse 76, temperature 98.2 F (36.8 C), temperature source Oral, resp. rate 18, height 6' (1.829 m), weight 186 lb 6.4 oz (84.6 kg), SpO2 99 %.    HEENT: No thrush or ulcers. Lymphatics: No palpable cervical, supraclavicular, axillary or inguinal lymph nodes. Resp: Lungs clear bilaterally. Cardio: Regular rate and rhythm. GI: Abdomen is soft and nontender. No organomegaly. No mass. Soft, reducible small incisional hernia. Vascular: No leg edema. Skin: Macular rash left pretibial region, scabbed in some areas.    Lab Results:  Lab Results  Component Value Date   WBC 7.1 08/04/2016   HGB 13.3 08/04/2016   HCT 39.9 08/04/2016   MCV 91.9 08/04/2016   PLT 292 08/04/2016   NEUTROABS 4.3 08/04/2016    Imaging:  No results found.  Medications: I have reviewed the patient's current medications.  Assessment/Plan: 1. Gastrointestinal stromal tumor stage II (T4 NX), most likely arising from the stomach, status post surgical resection including a partial gastrectomy on 07/19/2013, 19 cm cystic mass with a low mitotic rate.  Initiation of adjuvant Gleevec 08/31/2013.   Gleevec dose reduced to 400 mg every other day beginning 10/18/2013 secondary to neutropenia.   Gleevec dose adjusted to 200 mg daily beginning 11/29/2013.   Gleevec dose adjusted to 300 mg daily beginning 12/13/2013.  Upper endoscopy 07/12/2014 without evidence of recurrent tumor in the stomach 2. Mild skin rash most likely secondary to  [place]. 3. History of Mild periorbital edema secondary to [place]. 4. Severe neutropenia secondary to [facility] 10/12/2013. Resolved. 5. History of Mild lower extremity edema likely related to [place]. 6. Admission 07/18/2014 with a small bowel obstruction-resolved with bowel rest, CT 07/18/2014 without evidence of recurrent tumor. Follow-up CT 02/16/2015 with no evidence of gastric region mass or wall thickening. 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 7. Status post evaluation in the emergency department for abdominal pain 05/12/2016. CT with no evidence for acute intra-abdominal or pelvic pathology. Sigmoid colon diverticulosis with no definite surrounding bowel inflammation. Slight decreased size of fat density masslike area within the upper pelvis thought to represent omental infarct; scattered peripheral calcifications now present within the lesion.   Disposition: Timothy Hall appears stable. He remains in clinical remission from the gastrointestinal stromal tumor. He will discontinue Gleevec mid February at which time he will have completed a three-year course. He will return for a follow-up visit in 6 months. He will contact the office in the interim with any problems.  Plan reviewed with [name].    [name] ANP/GNP-BC   08/04/2016  1:28 PM

## 2016-08-15 ENCOUNTER — Other Ambulatory Visit: Payer: 59

## 2016-08-15 ENCOUNTER — Ambulatory Visit: Payer: 59 | Admitting: Oncology

## 2016-10-13 ENCOUNTER — Telehealth: Payer: Self-pay | Admitting: *Deleted

## 2016-10-13 NOTE — Telephone Encounter (Signed)
Received prior auth request from [facility] for [place]. Called pt, confirmed he completed therapy on 09/09/16.

## 2016-10-23 NOTE — Telephone Encounter (Signed)
Oral Chemotherapy Pharmacist Encounter  Received another prior authorization request for [place]. Noted patient has completed [place] therapy. I called [facility] specialty pharmacy to update them on therapy status and to stop PA requests from continue ton to come to office. Patient will be closed out of their system.  Johny Drilling, PharmD, BCPS, BCOP 10/23/2016  3:22 PM [facility] [phone]

## 2017-01-11 ENCOUNTER — Telehealth: Payer: Self-pay | Admitting: Oncology

## 2017-01-11 NOTE — Telephone Encounter (Signed)
S/w pt's wife, advised appt 7/20 moved due to md pal. Pt's wife says pt can only get off from work on fridays. Gave appt for 8/10 @ 11am.

## 2017-02-06 ENCOUNTER — Ambulatory Visit: Payer: 59 | Admitting: Oncology

## 2017-02-27 ENCOUNTER — Ambulatory Visit (HOSPITAL_BASED_OUTPATIENT_CLINIC_OR_DEPARTMENT_OTHER): Payer: 59 | Admitting: Oncology

## 2017-02-27 ENCOUNTER — Telehealth: Payer: Self-pay | Admitting: Oncology

## 2017-02-27 VITALS — BP 157/71 | HR 66 | Temp 98.0°F | Resp 18 | Ht 72.0 in | Wt 189.7 lb

## 2017-02-27 DIAGNOSIS — D490 Neoplasm of unspecified behavior of digestive system: Secondary | ICD-10-CM

## 2017-02-27 DIAGNOSIS — Z85028 Personal history of other malignant neoplasm of stomach: Secondary | ICD-10-CM | POA: Diagnosis not present

## 2017-02-27 NOTE — Telephone Encounter (Signed)
Gave patient avs and calendar for Jan of 2019.   CR should contact him about a CT

## 2017-02-27 NOTE — Progress Notes (Signed)
[place] OFFICE PROGRESS NOTE   Diagnosis: Gastrointestinal stromal tumor  INTERVAL HISTORY:   Timothy Hall returns as scheduled. He completed Gleevec in February. He reports improved energy and resolution of a rash and "cramping "when the [place] was discontinued. He has a rash at the left lower leg that is improved. No other complaint.  Objective:  Vital signs in last 24 hours:  Blood pressure (!) 157/71, pulse 66, temperature 98 F (36.7 C), temperature source Oral, resp. rate 18, height 6' (1.829 m), weight 189 lb 11.2 oz (86 kg), SpO2 100 %.    HEENT: Neck without mass Lymphatics: No cervical, supraclavicular, axillary, or inguinal nodes Resp: Lungs clear bilaterally Cardio: Regular rate and rhythm GI: No hepatosplenomegaly, no mass, no apparent ascites, nontender Vascular: The left lower leg is slightly larger than the right side, no edema  Skin: Plaque of Dryness/erythema at the left lower pretibial region   Lab Results:  Lab Results  Component Value Date   WBC 7.1 08/04/2016   HGB 13.3 08/04/2016   HCT 39.9 08/04/2016   MCV 91.9 08/04/2016   PLT 292 08/04/2016   NEUTROABS 4.3 08/04/2016    CMP     Component Value Date/Time   NA 141 08/04/2016 1224   K 3.9 08/04/2016 1224   CL 108 05/12/2016 2227   CO2 25 08/04/2016 1224   GLUCOSE 120 08/04/2016 1224   BUN 15.9 08/04/2016 1224   CREATININE 1.1 08/04/2016 1224   CALCIUM 9.4 08/04/2016 1224   PROT 7.4 08/04/2016 1224   ALBUMIN 3.8 08/04/2016 1224   AST 20 08/04/2016 1224   ALT 23 08/04/2016 1224   ALKPHOS 104 08/04/2016 1224   BILITOT 0.36 08/04/2016 1224   GFRNONAA >60 05/12/2016 2227   GFRAA >60 05/12/2016 2227     Medications: I have reviewed the patient's current medications.  Assessment/Plan: 1. Gastrointestinal stromal tumor stage II (T4 NX), most likely arising from the stomach, status post surgical resection including a partial gastrectomy on 07/19/2013, 19 cm cystic mass  with a low mitotic rate.  Initiation of adjuvant Gleevec 08/31/2013.   Gleevec dose reduced to 400 mg every other day beginning 10/18/2013 secondary to neutropenia.   Gleevec dose adjusted to 200 mg daily beginning 11/29/2013.   Gleevec dose adjusted to 300 mg daily beginning 12/13/2013.  Upper endoscopy 07/12/2014 without evidence of recurrent tumor in the stomach 2. Mild skin rash most likely secondary to [place]. 3. History of Mild periorbital edema secondary to [place]. 4. Severe neutropenia secondary to [facility] 10/12/2013. Resolved. 5. History of Mild lower extremity edema likely related to [place]. 6. Admission 07/18/2014 with a small bowel obstruction-resolved with bowel rest, CT 07/18/2014 without evidence of recurrent tumor. Follow-up CT 02/16/2015 with no evidence of gastric region mass or wall thickening. 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 7. Status post evaluation in the emergency department for abdominal pain 05/12/2016. CT with no evidence for acute intra-abdominal or pelvic pathology. Sigmoid colon diverticulosis with no definite surrounding bowel inflammation. Slight decreased size of fat density masslike area within the upper pelvis thought to represent omental infarct; scattered peripheral calcifications now present within the lesion.   [name] remains in clinical remission from the gastrointestinal stromal tumor. We discussed recent data suggesting an improved disease-free survival with 5 years of [place] in some patients with resected gastric intestinal stromal tumors. He had significant toxicity while on Gleevec and he is been maintained off of [place] for 6 months. We decided  to follow him with observation.  He will undergo a restaging CT evaluation prior to office visit in January.  Mr. Ullom will contact us in the interim for new symptoms.  15 minutes were spent with the patient today. The majority of the time was used for  counseling and coordination of care.      Donneta Romberg, MD  02/27/2017  11:55 AM

## 2017-05-18 ENCOUNTER — Observation Stay (HOSPITAL_COMMUNITY): Payer: 59

## 2017-05-18 ENCOUNTER — Emergency Department (HOSPITAL_COMMUNITY): Payer: 59

## 2017-05-18 ENCOUNTER — Encounter (HOSPITAL_COMMUNITY): Payer: Self-pay | Admitting: *Deleted

## 2017-05-18 ENCOUNTER — Observation Stay (HOSPITAL_COMMUNITY)
Admission: EM | Admit: 2017-05-18 | Discharge: 2017-05-19 | Disposition: A | Discharge status: Home / Self Care | Payer: 59 | Attending: General Surgery | Admitting: General Surgery

## 2017-05-18 DIAGNOSIS — F17210 Nicotine dependence, cigarettes, uncomplicated: Secondary | ICD-10-CM | POA: Diagnosis not present

## 2017-05-18 DIAGNOSIS — Z86010 Personal history of colonic polyps: Secondary | ICD-10-CM | POA: Diagnosis not present

## 2017-05-18 DIAGNOSIS — Z79899 Other long term (current) drug therapy: Secondary | ICD-10-CM | POA: Insufficient documentation

## 2017-05-18 DIAGNOSIS — Z9221 Personal history of antineoplastic chemotherapy: Secondary | ICD-10-CM | POA: Diagnosis not present

## 2017-05-18 DIAGNOSIS — I10 Essential (primary) hypertension: Secondary | ICD-10-CM | POA: Diagnosis not present

## 2017-05-18 DIAGNOSIS — G4730 Sleep apnea, unspecified: Secondary | ICD-10-CM | POA: Diagnosis not present

## 2017-05-18 DIAGNOSIS — K55069 Acute infarction of intestine, part and extent unspecified: Principal | ICD-10-CM | POA: Insufficient documentation

## 2017-05-18 DIAGNOSIS — I700 Atherosclerosis of aorta: Secondary | ICD-10-CM | POA: Insufficient documentation

## 2017-05-18 DIAGNOSIS — K219 Gastro-esophageal reflux disease without esophagitis: Secondary | ICD-10-CM | POA: Diagnosis not present

## 2017-05-18 DIAGNOSIS — K56609 Unspecified intestinal obstruction, unspecified as to partial versus complete obstruction: Secondary | ICD-10-CM

## 2017-05-18 DIAGNOSIS — K5730 Diverticulosis of large intestine without perforation or abscess without bleeding: Secondary | ICD-10-CM | POA: Insufficient documentation

## 2017-05-18 DIAGNOSIS — K56600 Partial intestinal obstruction, unspecified as to cause: Secondary | ICD-10-CM | POA: Diagnosis present

## 2017-05-18 LAB — COMPREHENSIVE METABOLIC PANEL
ALBUMIN: 3.9 g/dL (ref 3.5–5.0)
ALT: 22 U/L (ref 17–63)
AST: 18 U/L (ref 15–41)
Alkaline Phosphatase: 107 U/L (ref 38–126)
Anion gap: 11 (ref 5–15)
BUN: 15 mg/dL (ref 6–20)
CHLORIDE: 103 mmol/L (ref 101–111)
CO2: 23 mmol/L (ref 22–32)
CREATININE: 0.96 mg/dL (ref 0.61–1.24)
Calcium: 9.2 mg/dL (ref 8.9–10.3)
GFR calc Af Amer: >60 mL/min (ref >60)
GLUCOSE: 115 mg/dL — AB (ref 65–99)
POTASSIUM: 3.8 mmol/L (ref 3.5–5.1)
SODIUM: 137 mmol/L (ref 135–145)
Total Bilirubin: 0.6 mg/dL (ref 0.3–1.2)
Total Protein: 7.5 g/dL (ref 6.5–8.1)

## 2017-05-18 LAB — CBC WITH DIFFERENTIAL/PLATELET
BASOS ABS: 0 10*3/uL (ref 0.0–0.1)
BASOS PCT: 0 %
Eosinophils Absolute: 0.3 10*3/uL (ref 0.0–0.7)
Eosinophils Relative: 3 %
HCT: 42.1 % (ref 39.0–52.0)
Hemoglobin: 14 g/dL (ref 13.0–17.0)
LYMPHS PCT: 28 %
Lymphs Abs: 3 10*3/uL (ref 0.7–4.0)
MCH: 30.2 pg (ref 26.0–34.0)
MCHC: 33.3 g/dL (ref 30.0–36.0)
MCV: 90.9 fL (ref 78.0–100.0)
Monocytes Absolute: 0.7 10*3/uL (ref 0.1–1.0)
Monocytes Relative: 7 %
Neutro Abs: 6.5 10*3/uL (ref 1.7–7.7)
Neutrophils Relative %: 62 %
Platelets: 306 10*3/uL (ref 150–400)
RBC: 4.63 MIL/uL (ref 4.22–5.81)
RDW: 13.4 % (ref 11.5–15.5)
WBC: 10.6 10*3/uL — AB (ref 4.0–10.5)

## 2017-05-18 LAB — CBC
HCT: 42.1 % (ref 39.0–52.0)
HEMOGLOBIN: 14.1 g/dL (ref 13.0–17.0)
MCH: 30.4 pg (ref 26.0–34.0)
MCHC: 33.5 g/dL (ref 30.0–36.0)
MCV: 90.7 fL (ref 78.0–100.0)
PLATELETS: 297 10*3/uL (ref 150–400)
RBC: 4.64 MIL/uL (ref 4.22–5.81)
RDW: 13.5 % (ref 11.5–15.5)
WBC: 8.1 10*3/uL (ref 4.0–10.5)

## 2017-05-18 LAB — CREATININE, SERUM
Creatinine, Ser: 0.93 mg/dL (ref 0.61–1.24)
GFR calc Af Amer: >60 mL/min (ref >60)

## 2017-05-18 LAB — URINALYSIS, ROUTINE W REFLEX MICROSCOPIC
Bilirubin Urine: NEGATIVE
GLUCOSE, UA: NEGATIVE mg/dL
HGB URINE DIPSTICK: NEGATIVE
Ketones, ur: NEGATIVE mg/dL
LEUKOCYTES UA: NEGATIVE
Nitrite: NEGATIVE
PROTEIN: NEGATIVE mg/dL
SPECIFIC GRAVITY, URINE: 1.004 — AB (ref 1.005–1.030)
pH: 6 (ref 5.0–8.0)

## 2017-05-18 LAB — SURGICAL PCR SCREEN
MRSA, PCR: NEGATIVE
Staphylococcus aureus: NEGATIVE

## 2017-05-18 LAB — LIPASE, BLOOD: LIPASE: 33 U/L (ref 11–51)

## 2017-05-18 MED ORDER — ACETAMINOPHEN 325 MG PO TABS: 650.0000 mg | ORAL_TABLET | Freq: Four times a day (QID) | ORAL | Status: DC | PRN

## 2017-05-18 MED ORDER — PANTOPRAZOLE SODIUM 40 MG IV SOLR
40.0000 mg | Freq: Every day | INTRAVENOUS | Status: DC
  Administered 2017-05-18: 21:00:00 40 mg via INTRAVENOUS
  Filled 2017-05-18: qty 40

## 2017-05-18 MED ORDER — HYDROMORPHONE HCL 1 MG/ML IJ SOLN: 0.5000 mg | INTRAMUSCULAR | Status: DC | PRN

## 2017-05-18 MED ORDER — DOCUSATE SODIUM 100 MG PO CAPS
100.0000 mg | ORAL_CAPSULE | Freq: Two times a day (BID) | ORAL | Status: DC
  Administered 2017-05-18 – 2017-05-19 (×2): 100 mg via ORAL
  Filled 2017-05-18 (×2): qty 1

## 2017-05-18 MED ORDER — HYDRALAZINE HCL 20 MG/ML IJ SOLN: 10.0000 mg | INTRAMUSCULAR | Status: DC | PRN

## 2017-05-18 MED ORDER — IOPAMIDOL (ISOVUE-300) INJECTION 61%
100.0000 mL | Freq: Once | INTRAVENOUS | Status: AC | PRN
  Administered 2017-05-18: 100 mL via INTRAVENOUS

## 2017-05-18 MED ORDER — ONDANSETRON HCL 4 MG/2ML IJ SOLN: 4.0000 mg | Freq: Four times a day (QID) | INTRAMUSCULAR | Status: DC | PRN

## 2017-05-18 MED ORDER — ONDANSETRON 4 MG PO TBDP: 4.0000 mg | ORAL_TABLET | Freq: Four times a day (QID) | ORAL | Status: DC | PRN

## 2017-05-18 MED ORDER — IOPAMIDOL (ISOVUE-300) INJECTION 61%
INTRAVENOUS | Status: AC
  Administered 2017-05-18: 100 mL via INTRAVENOUS
  Filled 2017-05-18: qty 100

## 2017-05-18 MED ORDER — TRAMADOL HCL 50 MG PO TABS: 50.0000 mg | ORAL_TABLET | Freq: Four times a day (QID) | ORAL | Status: DC | PRN

## 2017-05-18 MED ORDER — ENOXAPARIN SODIUM 40 MG/0.4ML ~~LOC~~ SOLN
40.0000 mg | SUBCUTANEOUS | Status: DC
  Administered 2017-05-18 – 2017-05-19 (×2): 40 mg via SUBCUTANEOUS
  Filled 2017-05-18 (×3): qty 0.4

## 2017-05-18 MED ORDER — OXYCODONE HCL 5 MG PO TABS
5.0000 mg | ORAL_TABLET | ORAL | Status: DC | PRN
Start: 2017-05-18 — End: 2017-05-19

## 2017-05-18 MED ORDER — POLYETHYLENE GLYCOL 3350 17 G PO PACK: 17.0000 g | PACK | Freq: Every day | ORAL | Status: DC | PRN

## 2017-05-18 MED ORDER — ACETAMINOPHEN 650 MG RE SUPP: 650.0000 mg | Freq: Four times a day (QID) | RECTAL | Status: DC | PRN

## 2017-05-18 MED ORDER — KCL IN DEXTROSE-NACL 40-5-0.45 MEQ/L-%-% IV SOLN
INTRAVENOUS | Status: DC
  Administered 2017-05-18: 125 mL/h via INTRAVENOUS
  Administered 2017-05-18 – 2017-05-19 (×2): via INTRAVENOUS
  Filled 2017-05-18 (×3): qty 1000

## 2017-05-18 NOTE — ED Notes (Signed)
Pt also c/o lump to left breast.

## 2017-05-18 NOTE — ED Provider Notes (Signed)
[place] DEPT Provider Note   CSN: [id number] Arrival date & time: 05/18/17  0606     History   Chief Complaint Chief Complaint  Patient presents with  . Abdominal Pain    HPI Timothy Hall is a 61 y.o. male who presents with abdominal pain. PMH significant for hx of GI stromal tumor s/p chemo and partial gastrectomy, GERD, ventral hernia, hx of bowel obstruction.  Patient states he has had intermittent left lower quadrant pain for the past month.  Tonight the pain was the most severe he is ever had it so therefore came to the ER.  Nothing makes it better or worse.  The pain starts over the left lower quadrant and shoots the periumbilical area.  It feels like a soreness.  He denies any fever, chills, chest pain, shortness of breath, upper abdominal pain, nausea vomiting, diarrhea, constipation.  Last bowel movement was today.  No urinary symptoms.  He went to his doctor who believes the pain may be caused by scar tissue.  He does have a hernia which appears larger but is reducible.  He denies penis or testicular pain.  Additionally, he states he has had some soreness over the left breast area.  He first noticed it when his walkie-talkie had the area.  He works as an Insurance underwriter.  His pain is intermittent as well and has been present for the past month as well.  Reports history of benign tumor on the right side which was resected in the 32s.  No nipple drainage or retraction.  No redness.  The area is very sore.  HPI  Past Medical History:  Diagnosis Date  . Cancer ([place])   . Complication of anesthesia    slow to awaken  . Gastric neoplasm    Cystic, 18 cm - recently diagnosed October 2014   . GERD (gastroesophageal reflux disease)   . Heart murmur age 37  . History of alcoholism ([place])    Quit drinking in 1985  . Pneumonia age 29   hx of  . Rash    upper back due to gleevac  . Sleep apnea    Intolerant of CPAP    Patient Active  Problem List   Diagnosis Date Noted  . Diverticulosis of colon without hemorrhage   . History of colonic polyps   . SBO (small bowel obstruction) ([place]) 07/19/2014  . Hematemesis 07/19/2014  . Gastrointestinal stromal tumor of stomach ([place]) 08/18/2013  . History of chest pain 08/04/2013  . Smokes 1.5 packs of cigarettes per day 07/21/2013  . Sleep apnea   . Gastric neoplasm s/p partial gastrectomy 07/19/2013 07/19/2013  . Preoperative cardiovascular examination 06/01/2013  . Heart murmur 06/01/2013  . Encounter for screening colonoscopy 05/17/2013  . Dizziness and giddiness 05/14/2013  . Essential hypertension, benign 05/14/2013  . Benign paroxysmal positional vertigo 05/14/2013    Past Surgical History:  Procedure Laterality Date  . breast tumor removed Right 1976  . COLONOSCOPY N/A 11/20/2014   Procedure: COLONOSCOPY;  Surgeon: [name], MD;  [facility];  Service: Endoscopy;  Laterality: N/A;  1030 - moved to 5/2 @ 10:30  . EUS N/A 05/25/2013   Procedure: ESOPHAGEAL ENDOSCOPIC ULTRASOUND (EUS) RADIAL;  Surgeon: [name], MD;  Location: [facility] END[facility];  Service: Endoscopy;  Laterality: N/A;  . EUS N/A 07/12/2014   EGD by Dr. [name]: small hh, widely patent Schatzki's ring. evidence of prior small gastric wedge resection along body of stomach.  no evidence of recurrent tumor.   [name] FINE NEEDLE ASPIRATION N/A 05/25/2013   Procedure: FINE NEEDLE ASPIRATION (FNA) LINEAR;  Surgeon: [name], MD;  Location[facility];  Service: Endoscopy;  Laterality: N/A;"this part never happend"  . LAPAROSCOPIC GASTRECTOMY N/A 07/19/2013   Procedure: RESECTION OF RETROGASTRIC CYSTIC NEOPLASM WITH PARTIAL GASTRECTOMY;  Surgeon: [name], MD;  Location: [facility] ORS;  Service: General;  Laterality: N/A;  . lipoma removed from back  yrs ago  . TUMOR REMOVAL     Benign tumor removed from left breast and back area.        Home Medications    Prior to Admission medications     Medication Sig Start Date End Date Taking? Authorizing Provider  docusate sodium (COLACE) 100 MG capsule Take 100 mg by mouth daily as needed for mild constipation.  07/23/14   [provider]  Omeprazole (PRILOSEC PO) Take 20 mg by mouth daily as needed.    [provider]  polyethylene glycol (MIRALAX / GLYCOLAX) packet Take 17 g by mouth daily as needed for moderate constipation.  07/23/14   [provider]    Family History Family History  Problem Relation Age of Onset  . Breast cancer Mother   . CAD Brother        Premature disease  . Colon cancer Neg Hx   . Stomach cancer Neg Hx     Social History Social History  Substance Use Topics  . Smoking status: Current Every Day Smoker    Packs/day: 1.00    Years: 40.00    Types: Cigarettes  . Smokeless tobacco: Never Used  . Alcohol use No     Comment: quit in 1985     Allergies   Nicoderm [nicotine]   Review of Systems Review of Systems  Constitutional: Negative for appetite change, chills and fever.  Respiratory: Negative for shortness of breath.   Cardiovascular: Negative for chest pain.  Gastrointestinal: Positive for abdominal pain. Negative for constipation, diarrhea, nausea and vomiting.  Genitourinary: Negative for dysuria, penile pain and testicular pain.  Skin:       +breast soreness     Physical Exam Updated Vital Signs BP (!) 148/76   Pulse 76   Temp 97.7 F (36.5 C)   Resp 14   Ht 6' (1.829 m)   Wt 86.2 kg (190 lb)   SpO2 96%   BMI 25.77 kg/m   Physical Exam  Constitutional: He is oriented to person, place, and time. He appears well-developed and well-nourished. No distress.  HENT:  Head: Normocephalic and atraumatic.  Eyes: Pupils are equal, round, and reactive to light. Conjunctivae are normal. Right eye exhibits no discharge. Left eye exhibits no discharge. No scleral icterus.  Neck: Normal range of motion.  Cardiovascular: Normal rate and regular rhythm.  Exam  reveals no gallop and no friction rub.   No murmur heard. Pulmonary/Chest: Effort normal and breath sounds normal. No respiratory distress. He has no wheezes. He has no rales. He exhibits tenderness (Tenderness over L breast in 3 o clock position. No palpable nodule).  Abdominal: Soft. Bowel sounds are normal. He exhibits no distension and no mass. There is tenderness (LLQ tenderness). There is no rebound and no guarding. A hernia (Large ventral hernia) is present.  Large surgical scar  Neurological: He is alert and oriented to person, place, and time.  Skin: Skin is warm and dry.  Psychiatric: He has a normal mood and affect. His behavior is normal.  Nursing note and vitals reviewed.    ED Treatments / Results  Labs (all labs ordered are listed, but only abnormal results are displayed) Labs Reviewed  CBC WITH DIFFERENTIAL/PLATELET - Abnormal; Notable for the following:       Result Value   WBC 10.6 (*)    All other components within normal limits  COMPREHENSIVE METABOLIC PANEL - Abnormal; Notable for the following:    Glucose, Bld 115 (*)    All other components within normal limits  URINALYSIS, ROUTINE W REFLEX MICROSCOPIC - Abnormal; Notable for the following:    Color, Urine STRAW (*)    Specific Gravity, Urine 1.004 (*)    All other components within normal limits  LIPASE, BLOOD    EKG  EKG Interpretation None       Radiology No results found.  Procedures Procedures (including critical care time)  Medications Ordered in ED Medications - No data to display   Initial Impression / Assessment and Plan / ED Course  I have reviewed the triage vital signs and the nursing notes.  Pertinent labs & imaging results that were available during my care of the patient were reviewed by me and considered in my medical decision making (see chart for details).  61 year old male presents with intermittent lower abdominal pain.  He is mildly hypertensive otherwise vital signs are  normal.  He is calm and comfortable appearing.  Will obtain labs and CT of abdomen to further evaluate.  Patient refused any pain medicine.  7:48 AM Labs are reassuring.  UA is normal CT pending.   CT shows partial small bowel obstruction spoke with Brigid Re PA-C with general surgery who will come to see patient.  Final Clinical Impressions(s) / ED Diagnoses   Final diagnoses:  Partial small bowel obstruction [facility])    New Prescriptions New Prescriptions   No medications on file     Recardo Evangelist, PA-C 05/18/17 1429    Ward, Delice Bison, DO 05/18/17 2259

## 2017-05-18 NOTE — ED Notes (Signed)
Patient taken to the floor by RN and report given to floor RN

## 2017-05-18 NOTE — H&P (Signed)
[facility] Surgery Admission Note  Timothy Hall [birth year]  [id number].    Requesting MD: Darl Householder Chief Complaint/Reason for Consult: PSBO HPI:  Patient is a 61 y/o male who presented to [facility] with abdominal pain that he has had for about 3-4 weeks. Pain in the LLQ characterized as sharp, intermittent. No associated symptoms. Patient denies fevers, chills, nausea, vomiting, blood in stool, urinary symptoms, chest pain, SOB. Patient is still passing flatus and having bowel movements. Patient tells me that he is currently hungry. CT scan in the ED showed partial small bowel obstruction with possible old mesenteric infarct. PMH significant for gastric cancer, he had a laparotomy for gastrectomy in 2014 with [name]. Had chemotherapy after but no radiation. Last colonoscopy last year, one polyp found and removed. No blood thinning medications. Patient denies current alcohol use or illicit drug use. Smokes cigarettes.   ROS: Review of Systems  Constitutional: Negative for chills and fever.  Respiratory: Negative for cough and shortness of breath.   Cardiovascular: Negative for chest pain and palpitations.  Gastrointestinal: Positive for abdominal pain. Negative for blood in stool, constipation, diarrhea, nausea and vomiting.  Genitourinary: Negative for dysuria, frequency and urgency.  All other systems reviewed and are negative.   Family History  Problem Relation Age of Onset  . Breast cancer Mother   . CAD Brother        Premature disease  . Colon cancer Neg Hx   . Stomach cancer Neg Hx     Past Medical History:  Diagnosis Date  . Cancer ([place])   . Complication of anesthesia    slow to awaken  . Gastric neoplasm    Cystic, 18 cm - recently diagnosed October 2014   . GERD (gastroesophageal reflux disease)   . Heart murmur age 76  . History of alcoholism ([place])    Quit drinking in 1985  . Pneumonia age 10   hx of  . Rash    upper back due to gleevac  . Sleep apnea     Intolerant of CPAP    Past Surgical History:  Procedure Laterality Date  . breast tumor removed Right 1976  . COLONOSCOPY N/A 11/20/2014   Procedure: COLONOSCOPY;  Surgeon: [name], MD;  [facility];  Service: Endoscopy;  Laterality: N/A;  1030 - moved to 5/2 @ 10:30  . EUS N/A 05/25/2013   Procedure: ESOPHAGEAL ENDOSCOPIC ULTRASOUND (EUS) RADIAL;  Surgeon: [name], MD;  [facility];  Service: Endoscopy;  Laterality: N/A;  . EUS N/A 07/12/2014   EGD by Dr. [name]: small hh, widely patent Schatzki's ring. evidence of prior small gastric wedge resection along body of stomach. no evidence of recurrent tumor.   [name] FINE NEEDLE ASPIRATION N/A 05/25/2013   Procedure: FINE NEEDLE ASPIRATION (FNA) LINEAR;  Surgeon: [name], MD;  [facility];  Service: Endoscopy;  Laterality: N/A;"this part never happend"  . LAPAROSCOPIC GASTRECTOMY N/A 07/19/2013   Procedure: RESECTION OF RETROGASTRIC CYSTIC NEOPLASM WITH PARTIAL GASTRECTOMY;  Surgeon: [name], MD;  [facility];  Service: General;  Laterality: N/A;  . lipoma removed from back  yrs ago  . TUMOR REMOVAL     Benign tumor removed from left breast and back area.     Social History:  reports that he has been smoking Cigarettes.  He has a 40.00 pack-year smoking history. He has never used smokeless tobacco. He reports that he does not drink alcohol or use drugs.  Allergies:  Allergies  Allergen Reactions  . Nicoderm [Nicotine] Rash     (Not in a hospital admission)  Blood pressure (!) 148/76, pulse 76, temperature 97.7 F (36.5 C), resp. rate 14, height 6' (1.829 m), weight 86.2 kg (190 lb), SpO2 96 %. Physical Exam: Physical Exam  Constitutional: Vital signs are normal. He appears well-developed and well-nourished. He is cooperative.  Non-toxic appearance. No distress.  HENT:  Head: Normocephalic and atraumatic.  Right Ear: External ear normal.  Left Ear: External ear normal.   Nose: Nose normal.  Mouth/Throat: Oropharynx is clear and moist and mucous membranes are normal.  Eyes: Pupils are equal, round, and reactive to light. Conjunctivae and lids are normal. No scleral icterus.  Neck: Normal range of motion and phonation normal. Neck supple. No thyromegaly present.  Cardiovascular: Normal rate and regular rhythm.   Pulses:      Radial pulses are 2+ on the right side, and 2+ on the left side.       Dorsalis pedis pulses are 2+ on the right side, and 2+ on the left side.  Pulmonary/Chest: Effort normal and breath sounds normal.  Abdominal: Soft. He exhibits no distension and no mass. Bowel sounds are decreased. There is no hepatosplenomegaly. There is tenderness in the left lower quadrant. There is guarding (mild). There is no rigidity and no rebound. A hernia is present. Hernia confirmed positive in the ventral area (not incarcerated).  Neurological: He is alert.    Results for orders placed or performed during the hospital encounter of 05/18/17 (from the past 48 hour(s))  Urinalysis, Routine w reflex microscopic     Status: Abnormal   Collection Time: 05/18/17  6:16 AM  Result Value Ref Range   Color, Urine STRAW (A) YELLOW   APPearance CLEAR CLEAR   Specific Gravity, Urine 1.004 (L) 1.005 - 1.030   pH 6.0 5.0 - 8.0   Glucose, UA NEGATIVE NEGATIVE mg/dL   Hgb urine dipstick NEGATIVE NEGATIVE   Bilirubin Urine NEGATIVE NEGATIVE   Ketones, ur NEGATIVE NEGATIVE mg/dL   Protein, ur NEGATIVE NEGATIVE mg/dL   Nitrite NEGATIVE NEGATIVE   Leukocytes, UA NEGATIVE NEGATIVE  CBC with Differential     Status: Abnormal   Collection Time: 05/18/17  6:33 AM  Result Value Ref Range   WBC 10.6 (H) 4.0 - 10.5 K/uL   RBC 4.63 4.22 - 5.81 MIL/uL   Hemoglobin 14.0 13.0 - 17.0 g/dL   HCT 42.1 39.0 - 52.0 %   MCV 90.9 78.0 - 100.0 fL   MCH 30.2 26.0 - 34.0 pg   MCHC 33.3 30.0 - 36.0 g/dL   RDW 13.4 11.5 - 15.5 %   Platelets 306 150 - 400 K/uL   Neutrophils Relative %  62 %   Neutro Abs 6.5 1.7 - 7.7 K/uL   Lymphocytes Relative 28 %   Lymphs Abs 3.0 0.7 - 4.0 K/uL   Monocytes Relative 7 %   Monocytes Absolute 0.7 0.1 - 1.0 K/uL   Eosinophils Relative 3 %   Eosinophils Absolute 0.3 0.0 - 0.7 K/uL   Basophils Relative 0 %   Basophils Absolute 0.0 0.0 - 0.1 K/uL  Comprehensive metabolic panel     Status: Abnormal   Collection Time: 05/18/17  6:33 AM  Result Value Ref Range   Sodium 137 135 - 145 mmol/L   Potassium 3.8 3.5 - 5.1 mmol/L   Chloride 103 101 - 111 mmol/L   CO2 23 22 - 32 mmol/L   Glucose, Bld  115 (H) 65 - 99 mg/dL   BUN 15 6 - 20 mg/dL   Creatinine, Ser 0.96 0.61 - 1.24 mg/dL   Calcium 9.2 8.9 - 10.3 mg/dL   Total Protein 7.5 6.5 - 8.1 g/dL   Albumin 3.9 3.5 - 5.0 g/dL   AST 18 15 - 41 U/L   ALT 22 17 - 63 U/L   Alkaline Phosphatase 107 38 - 126 U/L   Total Bilirubin 0.6 0.3 - 1.2 mg/dL   GFR calc non Af Amer >60 >60 mL/min   GFR calc Af Amer >60 >60 mL/min    Comment: (NOTE) The eGFR has been calculated using the CKD EPI equation. This calculation has not been validated in all clinical situations. eGFR's persistently <60 mL/min signify possible Chronic Kidney Disease.    Anion gap 11 5 - 15  Lipase, blood     Status: None   Collection Time: 05/18/17  6:33 AM  Result Value Ref Range   Lipase 33 11 - 51 U/L   Dg Chest 2 View  Result Date: 05/18/2017 CLINICAL DATA:  Intermittent lower abdominal pain and shortness of breath. Smoker. EXAM: CHEST  2 VIEW COMPARISON:  07/11/2013. FINDINGS: Normal cardiomediastinal silhouette. No consolidation or edema. No effusion or pneumothorax. Bones unremarkable. IMPRESSION: Stable chest.  No active disease. Electronically Signed   By: Staci Righter M.D.   On: 05/18/2017 08:07   Ct Abdomen Pelvis W Contrast  Result Date: 05/18/2017 CLINICAL DATA:  Abdominal pain for 3 weeks. EXAM: CT ABDOMEN AND PELVIS WITH CONTRAST TECHNIQUE: Multidetector CT imaging of the abdomen and pelvis was performed  using the standard protocol following bolus administration of intravenous contrast. CONTRAST:  100 cc Isovue 300 COMPARISON:  CT scans dated 05/12/2016, 02/16/2015, 07/18/2014 and 05/13/2013 FINDINGS: Lower chest: Minimal atelectasis at the right base posteriorly. Hepatobiliary: No significant abnormality. Biliary tree is normal. 2 tiny stable cysts in the right lobe of the liver. Pancreas: Unremarkable. No pancreatic ductal dilatation or surrounding inflammatory changes. Spleen: Single chronic calcified granuloma in the otherwise normal spleen. Adrenals/Urinary Tract: Benign-appearing 8 mm cyst on the lateral aspect of the upper pole of the left kidney. Kidneys are otherwise normal. Adrenal glands and bladder appear normal. Stomach/Bowel: There is focal dilatation of a single small bowel loop in the left mid abdomen adjacent to an area of probable old omental infarct with associated calcification and soft tissue stranding. Multiple diverticula in the distal colon without diverticulitis. Surgical staples in the body of the stomach. The bowel otherwise appears normal. Vascular/Lymphatic: Aortic atherosclerosis. No enlarged abdominal or pelvic lymph nodes. Reproductive: Prostate is unremarkable. Other: No abdominal wall hernia or abnormality. No abdominopelvic ascites. Musculoskeletal: No acute or significant osseous findings. IMPRESSION: Findings consistent with focal partial small-bowel obstruction in the left mid abdomen adjacent to an area of previous omental infarction. Electronically Signed   By: Lorriane Shire M.D.   On: 05/18/2017 09:30      Assessment/Plan Sleep Apnea GERD Hx of gastric neoplasm - s/p open gastrectomy 07/19/2013 [name]  PSBO - CT: Findings consistent with focal partial small-bowel obstruction in the left mid abdomen adjacent to an area of previous omental infarction. - XR: Moderate stool burden throughout the colon - TTP in the LLQ, no rebound or signs of peritonitis - no  indications for acute surgical intervention - bowel rest, IVF - admit for observation   FEN: NPO, IVF; patient may have sips with meds VTE: SCDs, lovenox ID: WBC 10.6, afebrile - recheck in AM  Brigid Re, [facility] Surgery 05/18/2017, 10:00 AM Pager: [phone] Consults: [phone] Mon-Fri 7:00 am-4:30 pm Sat-Sun 7:00 am-11:30 am

## 2017-05-18 NOTE — ED Triage Notes (Signed)
Pt c/o intermittent lower abd pain x 3 wks.  Pain became work Midwife.  Pt denies n/v/d.  Hx of cancer.

## 2017-05-19 ENCOUNTER — Observation Stay (HOSPITAL_COMMUNITY): Payer: 59

## 2017-05-19 LAB — HIV ANTIBODY (ROUTINE TESTING W REFLEX): HIV SCREEN 4TH GENERATION: NONREACTIVE

## 2017-05-19 LAB — BASIC METABOLIC PANEL
Anion gap: 7 (ref 5–15)
BUN: 11 mg/dL (ref 6–20)
CALCIUM: 9 mg/dL (ref 8.9–10.3)
CHLORIDE: 107 mmol/L (ref 101–111)
CO2: 25 mmol/L (ref 22–32)
CREATININE: 1.08 mg/dL (ref 0.61–1.24)
Glucose, Bld: 116 mg/dL — ABNORMAL HIGH (ref 65–99)
Potassium: 4.8 mmol/L (ref 3.5–5.1)
SODIUM: 139 mmol/L (ref 135–145)

## 2017-05-19 LAB — CBC
HCT: 42.3 % (ref 39.0–52.0)
HEMOGLOBIN: 13.9 g/dL (ref 13.0–17.0)
MCH: 30.2 pg (ref 26.0–34.0)
MCHC: 32.9 g/dL (ref 30.0–36.0)
MCV: 92 fL (ref 78.0–100.0)
Platelets: 298 10*3/uL (ref 150–400)
RBC: 4.6 MIL/uL (ref 4.22–5.81)
RDW: 13.4 % (ref 11.5–15.5)
WBC: 8.1 10*3/uL (ref 4.0–10.5)

## 2017-05-19 MED ORDER — TRAMADOL HCL 50 MG PO TABS: 50.0000 mg | ORAL_TABLET | Freq: Four times a day (QID) | ORAL | Status: DC | PRN

## 2017-05-19 MED ORDER — DOCUSATE SODIUM 100 MG PO CAPS: 100.0000 mg | ORAL_CAPSULE | Freq: Two times a day (BID) | ORAL | 0 refills | Status: DC

## 2017-05-19 MED ORDER — POLYETHYLENE GLYCOL 3350 17 G PO PACK: 17.0000 g | PACK | Freq: Every day | ORAL | 0 refills | Status: DC | PRN

## 2017-05-19 MED ORDER — PANTOPRAZOLE SODIUM 40 MG PO TBEC: 40.0000 mg | DELAYED_RELEASE_TABLET | Freq: Every day | ORAL | Status: DC

## 2017-05-19 NOTE — Progress Notes (Signed)
Reviewed d/c instructions with patient. Patient verbalized understanding of all instructions. Awaiting ride home. Will cont to monitor.

## 2017-05-19 NOTE — Progress Notes (Signed)
The patient is receiving Protonix by the intravenous route.  Based on criteria approved by the Pharmacy and [place], the medication is being converted to the equivalent oral dose form.  These criteria include: -No active GI bleeding -Able to tolerate diet of full liquids (or better) or tube feeding -Able to tolerate other medications by the oral or enteral route  If you have any questions about this conversion, please contact the Pharmacy Department (phone [phone]).  Thank you. Eudelia Bunch, Pharm.D. [phone] 05/19/2017 2:04 PM

## 2017-05-19 NOTE — Discharge Summary (Signed)
[facility]/Trauma Discharge Summary   Patient ID: Timothy Hall MRN: [id number] DOB/AGE: Mar 08, 1956 61 y.o.  Admit date: 05/18/2017 Discharge date: 05/19/2017  Admitting Diagnosis: Partial small bowel obstruction  Discharge Diagnosis Patient Active Problem List   Diagnosis Date Noted  . Partial small bowel obstruction ([place]) 05/18/2017  . Diverticulosis of colon without hemorrhage   . History of colonic polyps   . SBO (small bowel obstruction) ([place]) 07/19/2014  . Hematemesis 07/19/2014  . Gastrointestinal stromal tumor of stomach ([place]) 08/18/2013  . History of chest pain 08/04/2013  . Smokes 1.5 packs of cigarettes per day 07/21/2013  . Sleep apnea   . Gastric neoplasm s/p partial gastrectomy 07/19/2013 07/19/2013  . Preoperative cardiovascular examination 06/01/2013  . Heart murmur 06/01/2013  . Encounter for screening colonoscopy 05/17/2013  . Dizziness and giddiness 05/14/2013  . Essential hypertension, benign 05/14/2013  . Benign paroxysmal positional vertigo 05/14/2013    Consultants none  Imaging: Dg Chest 2 View  Result Date: 05/18/2017 CLINICAL DATA:  Intermittent lower abdominal pain and shortness of breath. Smoker. EXAM: CHEST  2 VIEW COMPARISON:  07/11/2013. FINDINGS: Normal cardiomediastinal silhouette. No consolidation or edema. No effusion or pneumothorax. Bones unremarkable. IMPRESSION: Stable chest.  No active disease. Electronically Signed   By: Staci Righter M.D.   On: 05/18/2017 08:07   Dg Abd 1 View  Result Date: 05/18/2017 CLINICAL DATA:  Partial small bowel obstruction, abdominal pain EXAM: ABDOMEN - 1 VIEW COMPARISON:  CT 05/18/2017 FINDINGS: Moderate stool burden throughout the colon. The visualized prominent left lower abdominal small bowel loop by CT earlier not well visualized by plain film. No organomegaly or free air. Contrast material noted within the renal collecting systems, ureters and bladder. No acute bony abnormality.  IMPRESSION: The previously seen prominent left lower abdominal small bowel loop not well visualized by plain film. Moderate stool burden throughout the colon. Electronically Signed   By: Rolm Baptise M.D.   On: 05/18/2017 11:29   Ct Abdomen Pelvis W Contrast  Result Date: 05/18/2017 CLINICAL DATA:  Abdominal pain for 3 weeks. EXAM: CT ABDOMEN AND PELVIS WITH CONTRAST TECHNIQUE: Multidetector CT imaging of the abdomen and pelvis was performed using the standard protocol following bolus administration of intravenous contrast. CONTRAST:  100 cc Isovue 300 COMPARISON:  CT scans dated 05/12/2016, 02/16/2015, 07/18/2014 and 05/13/2013 FINDINGS: Lower chest: Minimal atelectasis at the right base posteriorly. Hepatobiliary: No significant abnormality. Biliary tree is normal. 2 tiny stable cysts in the right lobe of the liver. Pancreas: Unremarkable. No pancreatic ductal dilatation or surrounding inflammatory changes. Spleen: Single chronic calcified granuloma in the otherwise normal spleen. Adrenals/Urinary Tract: Benign-appearing 8 mm cyst on the lateral aspect of the upper pole of the left kidney. Kidneys are otherwise normal. Adrenal glands and bladder appear normal. Stomach/Bowel: There is focal dilatation of a single small bowel loop in the left mid abdomen adjacent to an area of probable old omental infarct with associated calcification and soft tissue stranding. Multiple diverticula in the distal colon without diverticulitis. Surgical staples in the body of the stomach. The bowel otherwise appears normal. Vascular/Lymphatic: Aortic atherosclerosis. No enlarged abdominal or pelvic lymph nodes. Reproductive: Prostate is unremarkable. Other: No abdominal wall hernia or abnormality. No abdominopelvic ascites. Musculoskeletal: No acute or significant osseous findings. IMPRESSION: Findings consistent with focal partial small-bowel obstruction in the left mid abdomen adjacent to an area of previous omental infarction.  Electronically Signed   By: Lorriane Shire M.D.   On: 05/18/2017 09:30   Dg  Abd 2 Views  Result Date: 05/19/2017 CLINICAL DATA:  Small bowel obstruction. EXAM: ABDOMEN - 2 VIEW COMPARISON:  Radiograph dated 05/18/2017 and CT scan dated 05/18/2017 FINDINGS: The bowel gas pattern is normal. There are no dilated loops of large or small bowel. The single loop of small bowel distended on the prior CT scan is not appreciable on this exam. No visible free air or free fluid.  No significant bone abnormality. IMPRESSION: Benign-appearing abdomen. Electronically Signed   By: Lorriane Shire M.D.   On: 05/19/2017 09:48    Procedures none  HPI: Patient is a 61 y/o male who presented to [facility] with abdominal pain that he has had for about 3-4 weeks. Pain in the LLQ characterized as sharp, intermittent. No associated symptoms. Patient denied fevers, chills, nausea, vomiting, blood in stool, urinary symptoms, chest pain, SOB. Patient was still passing flatus and having bowel movements. CT scan in the ED showed partial small bowel obstruction with possible old mesenteric infarct. PMH significant for gastric cancer, he had a laparotomy for gastrectomy in 2014 with [name]. Had chemotherapy after but no radiation. Last colonoscopy last year, one polyp found and removed.  Hospital Course:  Patient was admitted. Abdominal xray the following day showed no dilated loops of bowel and large stool burden. Pt began having BM's. Diet was advanced as tolerated.  On 05/19/17, the patient was voiding well, tolerating diet, ambulating well, abdominal pain resolved, vital signs stable, and felt stable for discharge home.  Patient will follow up with our office as needed and knows to call with questions or concerns.  Patient was discharged in good condition.   Physical Exam: Gen:  Alert, NAD, pleasant, cooperative Card:  RRR, no M/G/R heard Pulm:  CTA, no W/R/R, effort normal Abd: Soft, NT/ND, +BS, no HSM Skin: no rashes  noted, warm and dry   Allergies as of 05/19/2017      Reactions   Nicoderm [nicotine] Rash      Medication List    TAKE these medications   docusate sodium 100 MG capsule Commonly known as:  COLACE Take 1 capsule (100 mg total) by mouth 2 (two) times daily.   omeprazole 20 MG tablet Commonly known as:  PRILOSEC OTC Take 20 mg by mouth daily as needed (for acid reflex).   polyethylene glycol packet Commonly known as:  MIRALAX / GLYCOLAX Take 17 g by mouth daily as needed for mild constipation.        Follow-up [facility], [place]. Call.   Specialty:  General Surgery Why:  as needed with any questions or concerns Contact information: [address] [place] [phone]          Signed: [facility] 05/19/2017, 4:04 PM Pager: [phone] Consults: [phone] Mon-Fri 7:00 am-4:30 pm Sat-Sun 7:00 am-11:30 am

## 2017-05-19 NOTE — Discharge Instructions (Signed)
Soft-Food Meal Plan Follow for one week after discharge Eat small frequent meals A soft-food meal plan includes foods that are safe and easy to swallow. This meal plan typically is used:  If you are having trouble chewing or swallowing foods.  As a transition meal plan after only having had liquid meals for a long period.  What do I need to know about the soft-food meal plan? A soft-food meal plan includes tender foods that are soft and easy to chew and swallow. In most cases, bite-sized pieces of food are easier to swallow. A bite-sized piece is about  inch or smaller. Foods in this plan do not need to be ground or pureed. Foods that are very hard, crunchy, or sticky should be avoided. Also, breads, cereals, yogurts, and desserts with nuts, seeds, or fruits should be avoided. What foods can I eat? Grains Rice and wild rice. Moist bread, dressing, pasta, and noodles. Well-moistened dry or cooked cereals, such as farina (cooked wheat cereal), oatmeal, or grits. Biscuits, breads, muffins, pancakes, and waffles that have been well moistened. Vegetables Shredded lettuce. Cooked, tender vegetables, including potatoes without skins. Vegetable juices. Broths or creamed soups made with vegetables that are not stringy or chewy. Strained tomatoes (without seeds). Fruits Canned or well-cooked fruits. Soft (ripe), peeled fresh fruits, such as peaches, nectarines, kiwi, cantaloupe, honeydew melon, and watermelon (without seeds). Soft berries with small seeds, such as strawberries. Fruit juices (without pulp). Meats and Other Protein Sources Moist, tender, lean beef. Mutton. Lamb. Veal. Chicken. [place]. Liver. Ham. Fish without bones. Eggs. Dairy Milk, milk drinks, and cream. Plain cream cheese and cottage cheese. Plain yogurt. Sweets/Desserts Flavored gelatin desserts. Custard. Plain ice cream, frozen yogurt, sherbet, milk shakes, and malts. Plain cakes and cookies. Plain hard candy. Other Butter,  margarine (without trans fat), and cooking oils. Mayonnaise. Cream sauces. Mild spices, salt, and sugar. Syrup, molasses, honey, and jelly. The items listed above may not be a complete list of recommended foods or beverages. Contact your dietitian for more options. What foods are not recommended? Grains Dry bread, toast, crackers that have not been moistened. Coarse or dry cereals, such as bran, granola, and shredded wheat. Tough or chewy crusty breads, such as [place] bread or baguettes. Vegetables Corn. Raw vegetables except shredded lettuce. Cooked vegetables that are tough or stringy. Tough, crisp, fried potatoes and potato skins. Fruits Fresh fruits with skins or seeds or both, such as apples, pears, or grapes. Stringy, high-pulp fruits, such as papaya, pineapple, coconut, or mango. Fruit leather, fruit roll-ups, and all dried fruits. Meats and Other Protein Sources Sausages and hot dogs. Meats with gristle. Fish with bones. Nuts, seeds, and chunky peanut or other nut butters. Sweets/Desserts Cakes or cookies that are very dry or chewy. The items listed above may not be a complete list of foods and beverages to avoid. Contact your dietitian for more information. This information is not intended to replace advice given to you by your health care provider. Make sure you discuss any questions you have with your health care provider. Document Released: 10/14/2007 Document Revised: 12/13/2015 Document Reviewed: 06/03/2013 Elsevier Interactive Patient Education  2017 [place] Bowel Obstruction A small bowel obstruction is a blockage in the small bowel. The small bowel, which is also called the small intestine, is a long, slender tube that connects the stomach to the colon. When a person eats and drinks, food and fluids go from the stomach to the small bowel. This is where most  of the nutrients in the food and fluids are absorbed. A small bowel obstruction will prevent food and fluids  from passing through the small bowel as they normally do during digestion. The small bowel can become partially or completely blocked. This can cause symptoms such as abdominal pain, vomiting, and bloating. If this condition is not treated, it can be dangerous because the small bowel could rupture. What are the causes? Common causes of this condition include:  Scar tissue from previous surgery or radiation treatment.  Recent surgery. This may cause the movements of the bowel to slow down and cause food to block the intestine.  Hernias.  Inflammatory bowel disease (colitis).  Twisting of the bowel (volvulus).  Tumors.  A foreign body.  Slipping of a part of the bowel into another part (intussusception).  What are the signs or symptoms? Symptoms of this condition include:  Abdominal pain. This may be dull cramps or sharp pain. It may occur in one area, or it may be present in the entire abdomen. Pain can range from mild to severe, depending on the degree of obstruction.  Nausea and vomiting. Vomit may be greenish or a yellow bile color.  Abdominal bloating.  Constipation.  Lack of passing gas.  Frequent belching.  Diarrhea. This may occur if the obstruction is partial and runny stool is able to leak around the obstruction.  How is this diagnosed? This condition may be diagnosed based on a physical exam, medical history, and X-rays of the abdomen. You may also have other tests, such as a CT scan of the abdomen and pelvis. How is this treated? Treatment for this condition depends on the cause and severity of the problem. Treatment options may include:  Bed rest along with fluids and pain medicines that are given through an IV tube inserted into one of your veins. Sometimes, this is all that is needed for the obstruction to improve.  Following a simple diet. In some cases, a clear liquid diet may be required for several days. This allows the bowel to rest.  Placement of a  small tube (nasogastric tube) into the stomach. When the bowel is blocked, it usually swells up like a balloon that is filled with air and fluids. The air and fluids may be removed by suction through the nasogastric tube. This can help with pain, discomfort, and nausea. It can also help the obstruction to clear up faster.  Surgery. This may be required if other treatments do not work. Bowel obstruction from a hernia may require early surgery and can be an emergency procedure. Surgery may also be required for scar tissue that causes frequent or severe obstructions.  Follow these instructions at home:  Get plenty of rest.  Follow instructions from your health care provider about eating restrictions. You may need to avoid solid foods and consume only clear liquids until your condition improves.  Take over-the-counter and prescription medicines only as told by your health care provider.  Keep all follow-up visits as told by your health care provider. This is important. Contact a health care provider if:  You have a fever.  You have chills. Get help right away if:  You have increased pain or cramping.  You vomit blood.  You have uncontrolled vomiting or nausea.  You cannot drink fluids because of vomiting or pain.  You develop confusion.  You begin feeling very dry or thirsty (dehydrated).  You have severe bloating.  You feel extremely weak or you faint. This information is  not intended to replace advice given to you by your health care provider. Make sure you discuss any questions you have with your health care provider. Document Released: 09/23/2005 Document Revised: 03/03/2016 Document Reviewed: 08/31/2014 Elsevier Interactive Patient Education  2017 [employer].

## 2017-05-19 NOTE — Progress Notes (Signed)
[facility]/Trauma Progress Note      Assessment/Plan Sleep Apnea GERD Hx of gastric neoplasm - s/p open gastrectomy 07/19/2013 [name]  PSBO - CT: Findings consistent with focal partial small-bowel obstruction in the left mid abdomen adjacent to an area of previous omental infarction. - XR: Moderate stool burden throughout the colon - no indications for acute surgical intervention - Timothy Hall having BM's and pain resolved  FEN: clears, IVF VTE: SCDs, lovenox ID: WBC 8.1, afebrile Follow up: TBD  Plan: advance diet slowly. Abd xray pending.   LOS: 0 days    Subjective: CC: hungry  Timothy Hall states pain and bloating has resolved. He had a BM yesterday and another small one this am. No nausea or vomiting. Wants to eat and go home.  Objective: Vital signs in last 24 hours: Temp:  [97.7 F (36.5 C)-98.3 F (36.8 C)] 98.1 F (36.7 C) (10/30 0537) Pulse Rate:  [63-66] 64 (10/30 0537) Resp:  [16-18] 16 (10/30 0537) BP: (136-150)/(60-73) 150/70 (10/30 0537) SpO2:  [97 %-99 %] 98 % (10/30 0537) Last BM Date: 05/18/17  Intake/Output from previous day: 10/29 0701 - 10/30 0700 In: 2160.4 [I.V.:2160.4] Out: 1150 [Urine:1150] Intake/Output this shift: No intake/output data recorded.  PE: Gen:  Alert, NAD, pleasant, cooperative Card:  RRR, no M/G/R heard Pulm:  CTA, no W/R/R, effort normal Abd: Soft, NT/ND, +BS, no HSM Skin: no rashes noted, warm and dry   Anti-infectives: Anti-infectives    None      Lab Results:   Recent Labs  05/18/17 1242 05/19/17 0518  WBC 8.1 8.1  HGB 14.1 13.9  HCT 42.1 42.3  PLT 297 298   BMET  Recent Labs  05/18/17 0633 05/18/17 1242 05/19/17 0518  NA 137  --  139  K 3.8  --  4.8  CL 103  --  107  CO2 23  --  25  GLUCOSE 115*  --  116*  BUN 15  --  11  CREATININE 0.96 0.93 1.08  CALCIUM 9.2  --  9.0   Timothy Hall/INR No results for input(s): LABPROT, INR in the last 72 hours. CMP     Component Value Date/Time   NA  139 05/19/2017 0518   NA 141 08/04/2016 1224   K 4.8 05/19/2017 0518   K 3.9 08/04/2016 1224   CL 107 05/19/2017 0518   CO2 25 05/19/2017 0518   CO2 25 08/04/2016 1224   GLUCOSE 116 (H) 05/19/2017 0518   GLUCOSE 120 08/04/2016 1224   BUN 11 05/19/2017 0518   BUN 15.9 08/04/2016 1224   CREATININE 1.08 05/19/2017 0518   CREATININE 1.1 08/04/2016 1224   CALCIUM 9.0 05/19/2017 0518   CALCIUM 9.4 08/04/2016 1224   PROT 7.5 05/18/2017 0633   PROT 7.4 08/04/2016 1224   ALBUMIN 3.9 05/18/2017 0633   ALBUMIN 3.8 08/04/2016 1224   AST 18 05/18/2017 0633   AST 20 08/04/2016 1224   ALT 22 05/18/2017 0633   ALT 23 08/04/2016 1224   ALKPHOS 107 05/18/2017 0633   ALKPHOS 104 08/04/2016 1224   BILITOT 0.6 05/18/2017 0633   BILITOT 0.36 08/04/2016 1224   GFRNONAA >60 05/19/2017 0518   GFRAA >60 05/19/2017 0518   Lipase     Component Value Date/Time   LIPASE 33 05/18/2017 0633    Studies/Results: Dg Chest 2 View  Result Date: 05/18/2017 CLINICAL DATA:  Intermittent lower abdominal pain and shortness of breath. Smoker. EXAM: CHEST  2 VIEW COMPARISON:  07/11/2013. FINDINGS: Normal cardiomediastinal  silhouette. No consolidation or edema. No effusion or pneumothorax. Bones unremarkable. IMPRESSION: Stable chest.  No active disease. Electronically Signed   By: Staci Righter M.D.   On: 05/18/2017 08:07   Dg Abd 1 View  Result Date: 05/18/2017 CLINICAL DATA:  Partial small bowel obstruction, abdominal pain EXAM: ABDOMEN - 1 VIEW COMPARISON:  CT 05/18/2017 FINDINGS: Moderate stool burden throughout the colon. The visualized prominent left lower abdominal small bowel loop by CT earlier not well visualized by plain film. No organomegaly or free air. Contrast material noted within the renal collecting systems, ureters and bladder. No acute bony abnormality. IMPRESSION: The previously seen prominent left lower abdominal small bowel loop not well visualized by plain film. Moderate stool burden  throughout the colon. Electronically Signed   By: Rolm Baptise M.D.   On: 05/18/2017 11:29   Ct Abdomen Pelvis W Contrast  Result Date: 05/18/2017 CLINICAL DATA:  Abdominal pain for 3 weeks. EXAM: CT ABDOMEN AND PELVIS WITH CONTRAST TECHNIQUE: Multidetector CT imaging of the abdomen and pelvis was performed using the standard protocol following bolus administration of intravenous contrast. CONTRAST:  100 cc Isovue 300 COMPARISON:  CT scans dated 05/12/2016, 02/16/2015, 07/18/2014 and 05/13/2013 FINDINGS: Lower chest: Minimal atelectasis at the right base posteriorly. Hepatobiliary: No significant abnormality. Biliary tree is normal. 2 tiny stable cysts in the right lobe of the liver. Pancreas: Unremarkable. No pancreatic ductal dilatation or surrounding inflammatory changes. Spleen: Single chronic calcified granuloma in the otherwise normal spleen. Adrenals/Urinary Tract: Benign-appearing 8 mm cyst on the lateral aspect of the upper pole of the left kidney. Kidneys are otherwise normal. Adrenal glands and bladder appear normal. Stomach/Bowel: There is focal dilatation of a single small bowel loop in the left mid abdomen adjacent to an area of probable old omental infarct with associated calcification and soft tissue stranding. Multiple diverticula in the distal colon without diverticulitis. Surgical staples in the body of the stomach. The bowel otherwise appears normal. Vascular/Lymphatic: Aortic atherosclerosis. No enlarged abdominal or pelvic lymph nodes. Reproductive: Prostate is unremarkable. Other: No abdominal wall hernia or abnormality. No abdominopelvic ascites. Musculoskeletal: No acute or significant osseous findings. IMPRESSION: Findings consistent with focal partial small-bowel obstruction in the left mid abdomen adjacent to an area of previous omental infarction. Electronically Signed   By: Lorriane Shire M.D.   On: 05/18/2017 09:30      [facility] Drape , [facility] 05/19/2017, 8:48 AM Pager: [phone] Consults: [phone] Mon-Fri 7:00 am-4:30 pm Sat-Sun 7:00 am-11:30 am

## 2017-07-19 ENCOUNTER — Encounter (HOSPITAL_COMMUNITY): Payer: Self-pay | Admitting: Emergency Medicine

## 2017-07-19 ENCOUNTER — Inpatient Hospital Stay (HOSPITAL_COMMUNITY)
Admission: EM | Admit: 2017-07-19 | Discharge: 2017-07-22 | DRG: 390 | Disposition: A | Discharge status: Home / Self Care | Payer: 59 | Attending: Internal Medicine | Admitting: Internal Medicine

## 2017-07-19 DIAGNOSIS — Z79899 Other long term (current) drug therapy: Secondary | ICD-10-CM

## 2017-07-19 DIAGNOSIS — K56600 Partial intestinal obstruction, unspecified as to cause: Principal | ICD-10-CM | POA: Diagnosis present

## 2017-07-19 DIAGNOSIS — K56609 Unspecified intestinal obstruction, unspecified as to partial versus complete obstruction: Secondary | ICD-10-CM

## 2017-07-19 DIAGNOSIS — Z85028 Personal history of other malignant neoplasm of stomach: Secondary | ICD-10-CM

## 2017-07-19 DIAGNOSIS — Z0189 Encounter for other specified special examinations: Secondary | ICD-10-CM

## 2017-07-19 DIAGNOSIS — D72829 Elevated white blood cell count, unspecified: Secondary | ICD-10-CM | POA: Diagnosis present

## 2017-07-19 DIAGNOSIS — Z903 Acquired absence of stomach [part of]: Secondary | ICD-10-CM

## 2017-07-19 DIAGNOSIS — Z888 Allergy status to other drugs, medicaments and biological substances status: Secondary | ICD-10-CM

## 2017-07-19 DIAGNOSIS — D490 Neoplasm of unspecified behavior of digestive system: Secondary | ICD-10-CM | POA: Diagnosis present

## 2017-07-19 DIAGNOSIS — G4730 Sleep apnea, unspecified: Secondary | ICD-10-CM | POA: Diagnosis present

## 2017-07-19 DIAGNOSIS — F1021 Alcohol dependence, in remission: Secondary | ICD-10-CM | POA: Diagnosis present

## 2017-07-19 DIAGNOSIS — R1030 Lower abdominal pain, unspecified: Secondary | ICD-10-CM | POA: Diagnosis not present

## 2017-07-19 DIAGNOSIS — Z8719 Personal history of other diseases of the digestive system: Secondary | ICD-10-CM

## 2017-07-19 DIAGNOSIS — K219 Gastro-esophageal reflux disease without esophagitis: Secondary | ICD-10-CM | POA: Diagnosis present

## 2017-07-19 DIAGNOSIS — F17210 Nicotine dependence, cigarettes, uncomplicated: Secondary | ICD-10-CM | POA: Diagnosis present

## 2017-07-19 DIAGNOSIS — C49A2 Gastrointestinal stromal tumor of stomach: Secondary | ICD-10-CM | POA: Diagnosis present

## 2017-07-19 LAB — CBC
HCT: 42.5 % (ref 39.0–52.0)
Hemoglobin: 14.2 g/dL (ref 13.0–17.0)
MCH: 30.3 pg (ref 26.0–34.0)
MCHC: 33.4 g/dL (ref 30.0–36.0)
MCV: 90.6 fL (ref 78.0–100.0)
PLATELETS: 300 10*3/uL (ref 150–400)
RBC: 4.69 MIL/uL (ref 4.22–5.81)
RDW: 13.5 % (ref 11.5–15.5)
WBC: 13.1 10*3/uL — ABNORMAL HIGH (ref 4.0–10.5)

## 2017-07-19 LAB — URINALYSIS, ROUTINE W REFLEX MICROSCOPIC
Bacteria, UA: NONE SEEN
Bilirubin Urine: NEGATIVE
Glucose, UA: NEGATIVE mg/dL
Hgb urine dipstick: NEGATIVE
KETONES UR: 5 mg/dL — AB
Leukocytes, UA: NEGATIVE
Nitrite: NEGATIVE
PH: 6 (ref 5.0–8.0)
Protein, ur: 30 mg/dL — AB
SPECIFIC GRAVITY, URINE: 1.026 (ref 1.005–1.030)
SQUAMOUS EPITHELIAL / LPF: NONE SEEN

## 2017-07-19 LAB — COMPREHENSIVE METABOLIC PANEL
ALK PHOS: 90 U/L (ref 38–126)
ALT: 35 U/L (ref 17–63)
AST: 28 U/L (ref 15–41)
Albumin: 3.8 g/dL (ref 3.5–5.0)
Anion gap: 6 (ref 5–15)
BUN: 14 mg/dL (ref 6–20)
CALCIUM: 9.1 mg/dL (ref 8.9–10.3)
CHLORIDE: 105 mmol/L (ref 101–111)
CO2: 24 mmol/L (ref 22–32)
CREATININE: 1.05 mg/dL (ref 0.61–1.24)
GFR calc Af Amer: >60 mL/min (ref >60)
GFR calc non Af Amer: >60 mL/min (ref >60)
Glucose, Bld: 126 mg/dL — ABNORMAL HIGH (ref 65–99)
Potassium: 4.5 mmol/L (ref 3.5–5.1)
SODIUM: 135 mmol/L (ref 135–145)
Total Bilirubin: 0.5 mg/dL (ref 0.3–1.2)
Total Protein: 7.1 g/dL (ref 6.5–8.1)

## 2017-07-19 LAB — LIPASE, BLOOD: LIPASE: 32 U/L (ref 11–51)

## 2017-07-19 MED ORDER — GI COCKTAIL ~~LOC~~
30.0000 mL | Freq: Once | ORAL | Status: AC
  Administered 2017-07-19: 30 mL via ORAL
  Filled 2017-07-19: qty 30

## 2017-07-19 NOTE — ED Provider Notes (Signed)
[facility] DEPT Provider Note   CSN: [id number] Arrival date & time: 07/19/17  1942     History   Chief Complaint Chief Complaint  Patient presents with  . Emesis  . Abdominal Pain    HPI ZEBULON GANTT is a 61 y.o. male past medical history of gastric resection, bowel obstruction who presents for evaluation of nausea/vomiting, generalized weakness and lower abdominal pain.  Patient reports that yesterday, he had some episodes of generalized weakness followed by small episodes of vomiting.  He states that he had decreased appetite throughout the day.  Patient reports that today, he had an additional episode of vomiting and started experiencing lower abdominal pain.  Patient reports that his last episode of vomiting was 40 minutes prior to ED arrival.  Patient was concerned because he had some phlegm with blood streaks in it.  He denies any gross hematemesis.  He is not taking any medications for his pain.  He denies any alleviating or aggravate.  Patient states that he thinks he had a bowel movement this morning, though his wife states that she thinks it is been 1-2 days since she has had last one.  He does not recall if he has been passing flatulence.  Patient denies any fevers, chest pain, difficulty breathing, dysuria, hematuria.  Patient does have a history of a gastric resection.  The history is provided by the patient.    Past Medical History:  Diagnosis Date  . Cancer ([place])   . Complication of anesthesia    slow to awaken  . Gastric neoplasm    Cystic, 18 cm - recently diagnosed October 2014   . GERD (gastroesophageal reflux disease)   . Heart murmur age 57  . History of alcoholism ([place])    Quit drinking in 1985  . Pneumonia age 53   hx of  . Rash    upper back due to gleevac  . Sleep apnea    Intolerant of CPAP    Patient Active Problem List   Diagnosis Date Noted  . Partial small bowel obstruction ([place]) 05/18/2017  . Diverticulosis  of colon without hemorrhage   . History of colonic polyps   . SBO (small bowel obstruction) ([place]) 07/19/2014  . Hematemesis 07/19/2014  . Gastrointestinal stromal tumor of stomach ([place]) 08/18/2013  . History of chest pain 08/04/2013  . Smokes 1.5 packs of cigarettes per day 07/21/2013  . Sleep apnea   . Gastric neoplasm s/p partial gastrectomy 07/19/2013 07/19/2013  . Preoperative cardiovascular examination 06/01/2013  . Heart murmur 06/01/2013  . Encounter for screening colonoscopy 05/17/2013  . Dizziness and giddiness 05/14/2013  . Essential hypertension, benign 05/14/2013  . Benign paroxysmal positional vertigo 05/14/2013    Past Surgical History:  Procedure Laterality Date  . breast tumor removed Right 1976  . COLONOSCOPY N/A 11/20/2014   Procedure: COLONOSCOPY;  Surgeon: [name], MD;  Location: [facility];  Service: Endoscopy;  Laterality: N/A;  1030 - moved to 5/2 @ 10:30  . EUS N/A 05/25/2013   Procedure: ESOPHAGEAL ENDOSCOPIC ULTRASOUND (EUS) RADIAL;  Surgeon: [name], MD;  Location: [facility];  Service: Endoscopy;  Laterality: N/A;  . EUS N/A 07/12/2014   EGD by Dr. [name]: small hh, widely patent Schatzki's ring. evidence of prior small gastric wedge resection along body of stomach. no evidence of recurrent tumor.   [name] FINE NEEDLE ASPIRATION N/A 05/25/2013   Procedure: FINE NEEDLE ASPIRATION (FNA) LINEAR;  Surgeon: [name], MD;  Location:  [facility];  Service: Endoscopy;  Laterality: N/A;"this part never happend"  . LAPAROSCOPIC GASTRECTOMY N/A 07/19/2013   Procedure: RESECTION OF RETROGASTRIC CYSTIC NEOPLASM WITH PARTIAL GASTRECTOMY;  Surgeon: [name], MD;  Location: [facility];  Service: General;  Laterality: N/A;  . lipoma removed from back  yrs ago  . TUMOR REMOVAL     Benign tumor removed from left breast and back area.        Home Medications    Prior to Admission medications   Medication Sig Start Date End Date Taking? Authorizing  Provider  docusate sodium (COLACE) 100 MG capsule Take 1 capsule (100 mg total) by mouth 2 (two) times daily. 05/19/17  Yes Focht, Jessica L, PA  omeprazole (PRILOSEC OTC) 20 MG tablet Take 20 mg by mouth daily as needed (for acid reflex).   Yes [provider]  polyethylene glycol (MIRALAX / GLYCOLAX) packet Take 17 g by mouth daily as needed for mild constipation. 05/19/17  Yes Focht, Fraser Din, PA    Family History Family History  Problem Relation Age of Onset  . Breast cancer Mother   . CAD Brother        Premature disease  . Colon cancer Neg Hx   . Stomach cancer Neg Hx     Social History Social History   Tobacco Use  . Smoking status: Current Every Day Smoker    Packs/day: 1.00    Years: 40.00    Pack years: 40.00    Types: Cigarettes  . Smokeless tobacco: Never Used  Substance Use Topics  . Alcohol use: No    Comment: quit in 1985  . Drug use: No    Comment: History of alcoholism     Allergies   Nicoderm [nicotine]   Review of Systems Review of Systems  Constitutional: Negative for fever.  Respiratory: Negative for cough and shortness of breath.   Cardiovascular: Negative for chest pain.  Gastrointestinal: Positive for abdominal pain, nausea and vomiting.  Genitourinary: Negative for dysuria and hematuria.  Neurological: Positive for weakness (generalized). Negative for headaches.     Physical Exam Updated Vital Signs BP (!) 163/99 (BP Location: Right Arm)   Pulse 73   Temp 98.3 F (36.8 C) (Oral)   Resp 17   SpO2 96%   Physical Exam  Constitutional: He is oriented to person, place, and time. He appears well-developed and well-nourished.  Appears uncomfortable but no acute distress   HENT:  Head: Normocephalic and atraumatic.  Mouth/Throat: Oropharynx is clear and moist and mucous membranes are normal.  Eyes: Conjunctivae, EOM and lids are normal. Pupils are equal, round, and reactive to light.  Neck: Full passive range of motion  without pain.  Cardiovascular: Normal rate, regular rhythm, normal heart sounds and normal pulses. Exam reveals no gallop and no friction rub.  No murmur heard. Pulmonary/Chest: Effort normal and breath sounds normal.  Abdominal: Soft. Normal appearance. Bowel sounds are decreased. There is tenderness in the suprapubic area and left lower quadrant. There is no rigidity, no guarding and no tenderness at McBurney's point.  Musculoskeletal: Normal range of motion.  Neurological: He is alert and oriented to person, place, and time.  Skin: Skin is warm and dry. Capillary refill takes less than 2 seconds.  Psychiatric: He has a normal mood and affect. His speech is normal.  Nursing note and vitals reviewed.    ED Treatments / Results  Labs (all labs ordered are listed, but only abnormal results are displayed) Labs Reviewed  COMPREHENSIVE METABOLIC PANEL - Abnormal; Notable for the following components:      Result Value   Glucose, Bld 126 (*)    All other components within normal limits  CBC - Abnormal; Notable for the following components:   WBC 13.1 (*)    All other components within normal limits  URINALYSIS, ROUTINE W REFLEX MICROSCOPIC - Abnormal; Notable for the following components:   Ketones, ur 5 (*)    Protein, ur 30 (*)    All other components within normal limits  LIPASE, BLOOD    EKG  EKG Interpretation None       Radiology Ct Abdomen Pelvis W Contrast  Result Date: 07/20/2017 CLINICAL DATA:  Acute onset of nausea and vomiting. Streaks of blood noted within the emesis. Leukocytosis. Felt pop at the lower abdomen. Initial encounter. EXAM: CT ABDOMEN AND PELVIS WITH CONTRAST TECHNIQUE: Multidetector CT imaging of the abdomen and pelvis was performed using the standard protocol following bolus administration of intravenous contrast. CONTRAST:  129mL ISOVUE-300 IOPAMIDOL (ISOVUE-300) INJECTION 61% COMPARISON:  CT of the abdomen and pelvis from 05/18/2017 FINDINGS: Lower  chest: The visualized lung bases are grossly clear. The visualized portions of the mediastinum are unremarkable. Hepatobiliary: The liver is unremarkable in appearance. The gallbladder is unremarkable in appearance. The common bile duct remains normal in caliber. Pancreas: The pancreas is within normal limits. Spleen: The spleen is unremarkable in appearance. Adrenals/Urinary Tract: The adrenal glands are unremarkable in appearance. A small left renal cyst is noted. Mild nonspecific perinephric stranding is noted bilaterally. There is no evidence of hydronephrosis. No renal or ureteral stones are identified. Stomach/Bowel: Postoperative change is noted at the antrum of the stomach. The stomach is otherwise unremarkable. There is recurrent partial small-bowel obstruction at the level of a probable focal omental infarct and associated calcification and scarring at the lower abdomen, with distention of ileal loops to 3.5 cm in diameter. There is partial decompression of more distal small bowel loops. The appendix is normal in caliber, without evidence of appendicitis. Mild diverticulosis is noted along the proximal sigmoid colon, without evidence of diverticulitis. Vascular/Lymphatic: Scattered calcification is seen along the abdominal aorta and its branches. The abdominal aorta is otherwise grossly unremarkable. The inferior vena cava is grossly unremarkable. No retroperitoneal lymphadenopathy is seen. No pelvic sidewall lymphadenopathy is identified. Reproductive: The bladder is mildly distended and grossly unremarkable. The prostate remains normal in size. Other: No additional soft tissue abnormalities are seen. Musculoskeletal: No acute osseous abnormalities are identified. Mild facet disease is noted at the lower lumbar spine. The visualized musculature is unremarkable in appearance. IMPRESSION: 1. Recurrent partial small-bowel obstruction at the level of a probable focal omental infarct and associated  calcification and scarring at the lower abdomen, with distention of ileal loops to 3.5 cm in diameter. Partial decompression of more distal small bowel loops. 2. Mild diverticulosis along the proximal sigmoid colon, without evidence of diverticulitis. Aortic Atherosclerosis (ICD10-I70.0). Electronically Signed   By: Garald Balding M.D.   On: 07/20/2017 01:12   Dg Abd Portable 1v-small Bowel Protocol-position Verification  Result Date: 07/20/2017 CLINICAL DATA:  61 y/o  M; enteric tube placement. EXAM: PORTABLE ABDOMEN - 1 VIEW COMPARISON:  07/20/2017 CT abdomen and pelvis. FINDINGS: Small bowel obstruction with dilated small loops of bowel in the left hemiabdomen. Enteric tube projects coiling of proximal stomach. Mild lumbar levocurvature and lower lumbar spondylosis. Retained contrast in the renal collecting systems. IMPRESSION: Enteric tube projects coiling over the proximal stomach. Persistent small bowel  obstruction. Electronically Signed   By: Kristine Garbe M.D.   On: 07/20/2017 05:04    Procedures Procedures (including critical care time)  Medications Ordered in ED Medications  iopamidol (ISOVUE-300) 61 % injection (not administered)  diatrizoate meglumine-sodium (GASTROGRAFIN) 66-10 % solution 90 mL (not administered)  0.9 %  sodium chloride infusion ( Intravenous New Bag/Given 07/20/17 0428)  acetaminophen (TYLENOL) tablet 650 mg (not administered)    Or  acetaminophen (TYLENOL) suppository 650 mg (not administered)  ondansetron (ZOFRAN) tablet 4 mg (not administered)    Or  ondansetron (ZOFRAN) injection 4 mg (not administered)  enoxaparin (LOVENOX) injection 40 mg (not administered)  gi cocktail (Maalox,Lidocaine,Donnatal) (30 mLs Oral Given 07/19/17 2340)  iopamidol (ISOVUE-300) 61 % injection 100 mL (100 mLs Intravenous Contrast Given 07/20/17 0037)  sodium chloride 0.9 % bolus 1,000 mL (1,000 mLs Intravenous New Bag/Given 07/20/17 0257)     Initial Impression /  Assessment and Plan / ED Course  I have reviewed the triage vital signs and the nursing notes.  Pertinent labs & imaging results that were available during my care of the patient were reviewed by me and considered in my medical decision making (see chart for details).     61 y.o. M with PMH/o Gastric Resection, Partial Bowel Obstruction (Nov 2018) who presents for evaluation of abdominal pain, vomiting and generalized weakness. Patient reports that felt generalized weakness yesterday and worsened today. Had 1 episode of vomiting yesterday, along with decreased appetite. Today had an episode of vomiting that was phlegm with blood streaks. No gross blood. Has had lower abdominal pain. Thinks his last BM was today but can't remember.   Patient is afebrile, non-toxic appearing, sitting comfortably on examination table. Vital signs reviewed and stable. Physical exam shows tenderness to the LLQ and suprapubic region.  Consider acute infectious etiology versus bowel obstruction.  Initial labs ordered at triage.  Given concerns of obstruction and tenderness on exam, will obtain CT imaging.  Offered patient analgesics but he declined at this time.  Labs reviewed.  Lipase unremarkable.  CBC shows slight leukocytosis of 13.1.  CMP unremarkable.  UA is unremarkable.  CT pending.  CT abdomen pelvis shows recurrent partial small bowel obstruction of the lower abdomen.  There is partial decompression of more distal small bowel loops.  There is also evidence of mild diverticulosis without evidence of much of diverticulitis.  Given findings, will consult surgery.  Discussed with surgical assistant as Dr. [name] is in the OR.  He will plan to see patient after he is done in the OR.  We will go ahead and consult medicine for admission.   Discussed patient with Dr. Alcario Drought (hospitalist). Will admit.    Final Clinical Impressions(s) / ED Diagnoses   Final diagnoses:  Partial intestinal obstruction, unspecified  cause [facility])    ED Discharge Orders    None       Volanda Napoleon, PA-C 07/20/17 0542    Veryl Speak, MD 07/20/17 [phone]

## 2017-07-19 NOTE — ED Notes (Signed)
double charted IV

## 2017-07-19 NOTE — ED Triage Notes (Signed)
Patient c/o one episode of lightheadedness followed by two episodes of vomiting yesterday. Today, patient reports another episode of weakness, with vomiting and states after "feeling a pop" in lower abdomen he vomited mucus with streaks of bright red blood. Hx gastric resection. Reports hx GERD and states "sometimes I throw up because of that."

## 2017-07-19 NOTE — ED Notes (Signed)
RN stated to Probation officer that he would obtain labs at the start IV.

## 2017-07-20 ENCOUNTER — Emergency Department (HOSPITAL_COMMUNITY): Payer: 59

## 2017-07-20 ENCOUNTER — Inpatient Hospital Stay (HOSPITAL_COMMUNITY): Payer: 59

## 2017-07-20 ENCOUNTER — Other Ambulatory Visit: Payer: Self-pay

## 2017-07-20 ENCOUNTER — Encounter (HOSPITAL_COMMUNITY): Payer: Self-pay

## 2017-07-20 ENCOUNTER — Observation Stay (HOSPITAL_COMMUNITY): Payer: 59

## 2017-07-20 DIAGNOSIS — Z903 Acquired absence of stomach [part of]: Secondary | ICD-10-CM | POA: Diagnosis not present

## 2017-07-20 DIAGNOSIS — F1021 Alcohol dependence, in remission: Secondary | ICD-10-CM | POA: Diagnosis present

## 2017-07-20 DIAGNOSIS — K56600 Partial intestinal obstruction, unspecified as to cause: Principal | ICD-10-CM

## 2017-07-20 DIAGNOSIS — D72829 Elevated white blood cell count, unspecified: Secondary | ICD-10-CM | POA: Diagnosis present

## 2017-07-20 DIAGNOSIS — Z85028 Personal history of other malignant neoplasm of stomach: Secondary | ICD-10-CM | POA: Diagnosis not present

## 2017-07-20 DIAGNOSIS — Z888 Allergy status to other drugs, medicaments and biological substances status: Secondary | ICD-10-CM | POA: Diagnosis not present

## 2017-07-20 DIAGNOSIS — Z79899 Other long term (current) drug therapy: Secondary | ICD-10-CM | POA: Diagnosis not present

## 2017-07-20 DIAGNOSIS — F17210 Nicotine dependence, cigarettes, uncomplicated: Secondary | ICD-10-CM | POA: Diagnosis present

## 2017-07-20 DIAGNOSIS — Z8719 Personal history of other diseases of the digestive system: Secondary | ICD-10-CM | POA: Diagnosis not present

## 2017-07-20 DIAGNOSIS — K56609 Unspecified intestinal obstruction, unspecified as to partial versus complete obstruction: Secondary | ICD-10-CM | POA: Diagnosis not present

## 2017-07-20 DIAGNOSIS — G4730 Sleep apnea, unspecified: Secondary | ICD-10-CM | POA: Diagnosis present

## 2017-07-20 DIAGNOSIS — K219 Gastro-esophageal reflux disease without esophagitis: Secondary | ICD-10-CM | POA: Diagnosis present

## 2017-07-20 DIAGNOSIS — R1030 Lower abdominal pain, unspecified: Secondary | ICD-10-CM | POA: Diagnosis present

## 2017-07-20 MED ORDER — ACETAMINOPHEN 650 MG RE SUPP: 650.0000 mg | Freq: Four times a day (QID) | RECTAL | Status: DC | PRN

## 2017-07-20 MED ORDER — MAGIC MOUTHWASH
15.0000 mL | Freq: Four times a day (QID) | ORAL | Status: DC | PRN
  Filled 2017-07-20: qty 15

## 2017-07-20 MED ORDER — METOCLOPRAMIDE HCL 5 MG/ML IJ SOLN: 5.0000 mg | Freq: Four times a day (QID) | INTRAMUSCULAR | Status: DC | PRN

## 2017-07-20 MED ORDER — LACTATED RINGERS IV BOLUS (SEPSIS): 1000.0000 mL | Freq: Three times a day (TID) | INTRAVENOUS | Status: AC | PRN

## 2017-07-20 MED ORDER — IOPAMIDOL (ISOVUE-300) INJECTION 61%
100.0000 mL | Freq: Once | INTRAVENOUS | Status: AC | PRN
  Administered 2017-07-20: 100 mL via INTRAVENOUS

## 2017-07-20 MED ORDER — GUAIFENESIN-DM 100-10 MG/5ML PO SYRP: 10.0000 mL | ORAL_SOLUTION | ORAL | Status: DC | PRN

## 2017-07-20 MED ORDER — PROCHLORPERAZINE EDISYLATE 5 MG/ML IJ SOLN: 5.0000 mg | INTRAMUSCULAR | Status: DC | PRN

## 2017-07-20 MED ORDER — DIPHENHYDRAMINE HCL 50 MG/ML IJ SOLN: 12.5000 mg | Freq: Four times a day (QID) | INTRAMUSCULAR | Status: DC | PRN

## 2017-07-20 MED ORDER — ONDANSETRON HCL 4 MG PO TABS: 4.0000 mg | ORAL_TABLET | Freq: Four times a day (QID) | ORAL | Status: DC | PRN

## 2017-07-20 MED ORDER — ONDANSETRON HCL 4 MG/2ML IJ SOLN: 4.0000 mg | Freq: Four times a day (QID) | INTRAMUSCULAR | Status: DC | PRN

## 2017-07-20 MED ORDER — IOPAMIDOL (ISOVUE-300) INJECTION 61%
INTRAVENOUS | Status: AC
  Filled 2017-07-20: qty 100

## 2017-07-20 MED ORDER — ACETAMINOPHEN 325 MG PO TABS: 650.0000 mg | ORAL_TABLET | Freq: Four times a day (QID) | ORAL | Status: DC | PRN

## 2017-07-20 MED ORDER — ALUM & MAG HYDROXIDE-SIMETH 200-200-20 MG/5ML PO SUSP
30.0000 mL | Freq: Four times a day (QID) | ORAL | Status: DC | PRN
  Administered 2017-07-21: 30 mL via ORAL
  Filled 2017-07-20: qty 30

## 2017-07-20 MED ORDER — LIP MEDEX EX OINT
1.0000 "application " | TOPICAL_OINTMENT | Freq: Two times a day (BID) | CUTANEOUS | Status: DC
  Administered 2017-07-20 – 2017-07-22 (×5): 1 via TOPICAL
  Filled 2017-07-20: qty 7

## 2017-07-20 MED ORDER — MENTHOL 3 MG MT LOZG
1.0000 | LOZENGE | OROMUCOSAL | Status: DC | PRN
  Filled 2017-07-20: qty 9

## 2017-07-20 MED ORDER — SODIUM CHLORIDE 0.9 % IV SOLN
INTRAVENOUS | Status: DC
Start: 2017-07-20 — End: 2017-07-22
  Administered 2017-07-20 – 2017-07-22 (×5): via INTRAVENOUS

## 2017-07-20 MED ORDER — PHENOL 1.4 % MT LIQD
1.0000 | OROMUCOSAL | Status: DC | PRN
  Filled 2017-07-20: qty 177

## 2017-07-20 MED ORDER — METHOCARBAMOL 1000 MG/10ML IJ SOLN
1000.0000 mg | Freq: Four times a day (QID) | INTRAVENOUS | Status: DC | PRN
  Filled 2017-07-20: qty 10

## 2017-07-20 MED ORDER — BISACODYL 10 MG RE SUPP: 10.0000 mg | Freq: Two times a day (BID) | RECTAL | Status: DC | PRN

## 2017-07-20 MED ORDER — DIATRIZOATE MEGLUMINE & SODIUM 66-10 % PO SOLN
90.0000 mL | Freq: Once | ORAL | Status: AC
  Administered 2017-07-20: 90 mL via NASOGASTRIC
  Filled 2017-07-20: qty 90

## 2017-07-20 MED ORDER — HYDROCORTISONE 1 % EX CREA
1.0000 | TOPICAL_CREAM | Freq: Three times a day (TID) | CUTANEOUS | Status: DC | PRN
Start: 2017-07-20 — End: 2017-07-22
  Filled 2017-07-20: qty 28

## 2017-07-20 MED ORDER — SODIUM CHLORIDE 0.9 % IV BOLUS (SEPSIS)
1000.0000 mL | Freq: Once | INTRAVENOUS | Status: AC
  Administered 2017-07-20: 1000 mL via INTRAVENOUS

## 2017-07-20 MED ORDER — HYDROCORTISONE 2.5 % RE CREA
1.0000 "application " | TOPICAL_CREAM | Freq: Four times a day (QID) | RECTAL | Status: DC | PRN
  Filled 2017-07-20: qty 28.35

## 2017-07-20 MED ORDER — ENOXAPARIN SODIUM 40 MG/0.4ML ~~LOC~~ SOLN
40.0000 mg | SUBCUTANEOUS | Status: DC
  Administered 2017-07-20 – 2017-07-22 (×3): 40 mg via SUBCUTANEOUS
  Filled 2017-07-20 (×3): qty 0.4

## 2017-07-20 NOTE — Progress Notes (Signed)
Patient arrived to room 1512 from ED. Patient alert and oriented x4. NGT to left nare, to LWS, draining yellow fluid. Patient abdomen distended, slightly tender. Pt has IV site to left hand. Oriented to room, call bell, bed use. Encouraged to call for assistance. Will ct monitor.

## 2017-07-20 NOTE — Progress Notes (Signed)
[id number]/Rhonda Davis,BSN,RN3,CCM/[phone]/Chart reviewed for cm needs. 

## 2017-07-20 NOTE — Progress Notes (Signed)
Subjective/Chief Complaint: Complains of sore throat. In bathroom trying to have bm   Objective: Vital signs in last 24 hours: Temp:  [98.3 F (36.8 C)-98.4 F (36.9 C)] 98.4 F (36.9 C) (12/31 0600) Pulse Rate:  [58-106] 58 (12/31 0600) Resp:  [12-20] 16 (12/31 0600) BP: (131-167)/(62-99) 167/74 (12/31 0600) SpO2:  [96 %-99 %] 99 % (12/31 0600) Weight:  [88.5 kg (195 lb 1.7 oz)] 88.5 kg (195 lb 1.7 oz) (12/31 0600) Last BM Date: 07/19/17  Intake/Output from previous day: 12/30 0701 - 12/31 0700 In: 181.7 [I.V.:181.7] Out: -  Intake/Output this shift: No intake/output data recorded.  General appearance: alert and cooperative Resp: clear to auscultation bilaterally Cardio: regular rate and rhythm GI: soft, nontender  Lab Results:  Recent Labs    07/19/17 2007  WBC 13.1*  HGB 14.2  HCT 42.5  PLT 300   BMET Recent Labs    07/19/17 2007  NA 135  K 4.5  CL 105  CO2 24  GLUCOSE 126*  BUN 14  CREATININE 1.05  CALCIUM 9.1   PT/INR No results for input(s): LABPROT, INR in the last 72 hours. ABG No results for input(s): PHART, HCO3 in the last 72 hours.  Invalid input(s): PCO2, PO2  Studies/Results: Ct Abdomen Pelvis W Contrast  Result Date: 07/20/2017 CLINICAL DATA:  Acute onset of nausea and vomiting. Streaks of blood noted within the emesis. Leukocytosis. Felt pop at the lower abdomen. Initial encounter. EXAM: CT ABDOMEN AND PELVIS WITH CONTRAST TECHNIQUE: Multidetector CT imaging of the abdomen and pelvis was performed using the standard protocol following bolus administration of intravenous contrast. CONTRAST:  176mL ISOVUE-300 IOPAMIDOL (ISOVUE-300) INJECTION 61% COMPARISON:  CT of the abdomen and pelvis from 05/18/2017 FINDINGS: Lower chest: The visualized lung bases are grossly clear. The visualized portions of the mediastinum are unremarkable. Hepatobiliary: The liver is unremarkable in appearance. The gallbladder is unremarkable in appearance. The  common bile duct remains normal in caliber. Pancreas: The pancreas is within normal limits. Spleen: The spleen is unremarkable in appearance. Adrenals/Urinary Tract: The adrenal glands are unremarkable in appearance. A small left renal cyst is noted. Mild nonspecific perinephric stranding is noted bilaterally. There is no evidence of hydronephrosis. No renal or ureteral stones are identified. Stomach/Bowel: Postoperative change is noted at the antrum of the stomach. The stomach is otherwise unremarkable. There is recurrent partial small-bowel obstruction at the level of a probable focal omental infarct and associated calcification and scarring at the lower abdomen, with distention of ileal loops to 3.5 cm in diameter. There is partial decompression of more distal small bowel loops. The appendix is normal in caliber, without evidence of appendicitis. Mild diverticulosis is noted along the proximal sigmoid colon, without evidence of diverticulitis. Vascular/Lymphatic: Scattered calcification is seen along the abdominal aorta and its branches. The abdominal aorta is otherwise grossly unremarkable. The inferior vena cava is grossly unremarkable. No retroperitoneal lymphadenopathy is seen. No pelvic sidewall lymphadenopathy is identified. Reproductive: The bladder is mildly distended and grossly unremarkable. The prostate remains normal in size. Other: No additional soft tissue abnormalities are seen. Musculoskeletal: No acute osseous abnormalities are identified. Mild facet disease is noted at the lower lumbar spine. The visualized musculature is unremarkable in appearance. IMPRESSION: 1. Recurrent partial small-bowel obstruction at the level of a probable focal omental infarct and associated calcification and scarring at the lower abdomen, with distention of ileal loops to 3.5 cm in diameter. Partial decompression of more distal small bowel loops. 2. Mild diverticulosis along the  proximal sigmoid colon, without  evidence of diverticulitis. Aortic Atherosclerosis (ICD10-I70.0). Electronically Signed   By: Garald Balding M.D.   On: 07/20/2017 01:12   Dg Abd Portable 1v-small Bowel Protocol-position Verification  Result Date: 07/20/2017 CLINICAL DATA:  61 y/o  M; enteric tube placement. EXAM: PORTABLE ABDOMEN - 1 VIEW COMPARISON:  07/20/2017 CT abdomen and pelvis. FINDINGS: Small bowel obstruction with dilated small loops of bowel in the left hemiabdomen. Enteric tube projects coiling of proximal stomach. Mild lumbar levocurvature and lower lumbar spondylosis. Retained contrast in the renal collecting systems. IMPRESSION: Enteric tube projects coiling over the proximal stomach. Persistent small bowel obstruction. Electronically Signed   By: Kristine Garbe M.D.   On: 07/20/2017 05:04    Anti-infectives: Anti-infectives (From admission, onward)   None      Assessment/Plan: s/p * No surgery found * Continue ng and bowel rest for sbo  Small bowel protocol. Repeat xrays today ambulate  LOS: 0 days    TOTH III,Daquane Aguilar S 07/20/2017

## 2017-07-20 NOTE — Consult Note (Signed)
Reason for Consult: Small bowel obstruction   Timothy Hall is an 61 y.o. male.  HPI: Patient is a 61 year old male known to our service with a history of resection of gastric gist tumor and postoperative Gleevac therapy with surgery in 2014 by [name].  He states that about 1 year following this he had an episode of small bowel obstruction that resolved nonoperatively with NG suction.  He did well until he was readmitted in October of this year with a partial small bowel obstruction that resolved very rapidly after a day or 2 without NG suction.  He had been doing well until 2 days prior to admission when he began to feel "lightheaded".  He did not feel like eating much.  Then about 12-16 hours ago he began to develop lower abdominal pain which he describes as pulling with intermittent sharp pain located across his lower abdomen.  This was associated with nausea and he vomited twice at home which was mucus and some blood.  The pain persisted and he was concerned about an obstruction and presented to the ER.  No bowel movements since onset of symptoms.  No fever or chills.  His abdomen feels somewhat distended.  Past Medical History:  Diagnosis Date  . Cancer ([place])   . Complication of anesthesia    slow to awaken  . Gastric neoplasm    Cystic, 18 cm - recently diagnosed October 2014   . GERD (gastroesophageal reflux disease)   . Heart murmur age 54  . History of alcoholism ([place])    Quit drinking in 1985  . Pneumonia age 11   hx of  . Rash    upper back due to gleevac  . Sleep apnea    Intolerant of CPAP    Past Surgical History:  Procedure Laterality Date  . breast tumor removed Right 1976  . COLONOSCOPY N/A 11/20/2014   Procedure: COLONOSCOPY;  Surgeon: Timothy Dolin, MD;  [facility];  Service: Endoscopy;  Laterality: N/A;  1030 - moved to 5/2 @ 10:30  . EUS N/A 05/25/2013   Procedure: ESOPHAGEAL ENDOSCOPIC ULTRASOUND (EUS) RADIAL;  Surgeon: Timothy Silence, MD;  Location:  [facility];  Service: Endoscopy;  Laterality: N/A;  . EUS N/A 07/12/2014   EGD by Dr. Paulita Hall: small hh, widely patent Schatzki's ring. evidence of prior small gastric wedge resection along body of stomach. no evidence of recurrent tumor.   [name] FINE NEEDLE ASPIRATION N/A 05/25/2013   Procedure: FINE NEEDLE ASPIRATION (FNA) LINEAR;  Surgeon: Timothy Silence, MD;  Location: [facility];  Service: Endoscopy;  Laterality: N/A;"this part never happend"  . LAPAROSCOPIC GASTRECTOMY N/A 07/19/2013   Procedure: RESECTION OF RETROGASTRIC CYSTIC NEOPLASM WITH PARTIAL GASTRECTOMY;  Surgeon: Timothy Hector, MD;  Location: [facility];  Service: General;  Laterality: N/A;  . lipoma removed from back  yrs ago  . TUMOR REMOVAL     Benign tumor removed from left breast and back area.     Family History  Problem Relation Age of Onset  . Breast cancer Mother   . CAD Brother        Premature disease  . Colon cancer Neg Hx   . Stomach cancer Neg Hx     Social History:  reports that he has been smoking cigarettes.  He has a 40.00 pack-year smoking history. he has never used smokeless tobacco. He reports that he does not drink alcohol or use drugs.  Allergies:  Allergies  Allergen Reactions  .  Nicoderm [Nicotine] Rash    Current Facility-Administered Medications  Medication Dose Route Frequency Provider Last Rate Last Dose  . 0.9 %  sodium chloride infusion   Intravenous Continuous Timothy Hall M, DO      . diatrizoate meglumine-sodium (GASTROGRAFIN) 66-10 % solution 90 mL  90 mL Per NG tube Once Timothy Seltzer, MD      . iopamidol (ISOVUE-300) 61 % injection            Current Outpatient Medications  Medication Sig Dispense Refill  . docusate sodium (COLACE) 100 MG capsule Take 1 capsule (100 mg total) by mouth 2 (two) times daily. 10 capsule 0  . omeprazole (PRILOSEC OTC) 20 MG tablet Take 20 mg by mouth daily as needed (for acid reflex).    . polyethylene glycol (MIRALAX / GLYCOLAX) packet Take  17 g by mouth daily as needed for mild constipation. 14 each 0     Results for orders placed or performed during the hospital encounter of 07/19/17 (from the past 48 hour(s))  Lipase, blood     Status: None   Collection Time: 07/19/17  8:07 PM  Result Value Ref Range   Lipase 32 11 - 51 U/L  Comprehensive metabolic panel     Status: Abnormal   Collection Time: 07/19/17  8:07 PM  Result Value Ref Range   Sodium 135 135 - 145 mmol/L   Potassium 4.5 3.5 - 5.1 mmol/L   Chloride 105 101 - 111 mmol/L   CO2 24 22 - 32 mmol/L   Glucose, Bld 126 (H) 65 - 99 mg/dL   BUN 14 6 - 20 mg/dL   Creatinine, Ser 1.05 0.61 - 1.24 mg/dL   Calcium 9.1 8.9 - 10.3 mg/dL   Total Protein 7.1 6.5 - 8.1 g/dL   Albumin 3.8 3.5 - 5.0 g/dL   AST 28 15 - 41 U/L   ALT 35 17 - 63 U/L   Alkaline Phosphatase 90 38 - 126 U/L   Total Bilirubin 0.5 0.3 - 1.2 mg/dL   GFR calc non Af Amer >60 >60 mL/min   GFR calc Af Amer >60 >60 mL/min    Comment: (NOTE) The eGFR has been calculated using the CKD EPI equation. This calculation has not been validated in all clinical situations. eGFR's persistently <60 mL/min signify possible Chronic Kidney Disease.    Anion gap 6 5 - 15  CBC     Status: Abnormal   Collection Time: 07/19/17  8:07 PM  Result Value Ref Range   WBC 13.1 (H) 4.0 - 10.5 K/uL   RBC 4.69 4.22 - 5.81 MIL/uL   Hemoglobin 14.2 13.0 - 17.0 g/dL   HCT 42.5 39.0 - 52.0 %   MCV 90.6 78.0 - 100.0 fL   MCH 30.3 26.0 - 34.0 pg   MCHC 33.4 30.0 - 36.0 g/dL   RDW 13.5 11.5 - 15.5 %   Platelets 300 150 - 400 K/uL  Urinalysis, Routine w reflex microscopic     Status: Abnormal   Collection Time: 07/19/17  8:15 PM  Result Value Ref Range   Color, Urine YELLOW YELLOW   APPearance CLEAR CLEAR   Specific Gravity, Urine 1.026 1.005 - 1.030   pH 6.0 5.0 - 8.0   Glucose, UA NEGATIVE NEGATIVE mg/dL   Hgb urine dipstick NEGATIVE NEGATIVE   Bilirubin Urine NEGATIVE NEGATIVE   Ketones, ur 5 (A) NEGATIVE mg/dL    Protein, ur 30 (A) NEGATIVE mg/dL   Nitrite NEGATIVE NEGATIVE  Leukocytes, UA NEGATIVE NEGATIVE   RBC / HPF 0-5 0 - 5 RBC/hpf   WBC, UA 0-5 0 - 5 WBC/hpf   Bacteria, UA NONE SEEN NONE SEEN   Squamous Epithelial / LPF NONE SEEN NONE SEEN   Mucus PRESENT     Ct Abdomen Pelvis W Contrast  Result Date: 07/20/2017 CLINICAL DATA:  Acute onset of nausea and vomiting. Streaks of blood noted within the emesis. Leukocytosis. Felt pop at the lower abdomen. Initial encounter. EXAM: CT ABDOMEN AND PELVIS WITH CONTRAST TECHNIQUE: Multidetector CT imaging of the abdomen and pelvis was performed using the standard protocol following bolus administration of intravenous contrast. CONTRAST:  17m ISOVUE-300 IOPAMIDOL (ISOVUE-300) INJECTION 61% COMPARISON:  CT of the abdomen and pelvis from 05/18/2017 FINDINGS: Lower chest: The visualized lung bases are grossly clear. The visualized portions of the mediastinum are unremarkable. Hepatobiliary: The liver is unremarkable in appearance. The gallbladder is unremarkable in appearance. The common bile duct remains normal in caliber. Pancreas: The pancreas is within normal limits. Spleen: The spleen is unremarkable in appearance. Adrenals/Urinary Tract: The adrenal glands are unremarkable in appearance. A small left renal cyst is noted. Mild nonspecific perinephric stranding is noted bilaterally. There is no evidence of hydronephrosis. No renal or ureteral stones are identified. Stomach/Bowel: Postoperative change is noted at the antrum of the stomach. The stomach is otherwise unremarkable. There is recurrent partial small-bowel obstruction at the level of a probable focal omental infarct and associated calcification and scarring at the lower abdomen, with distention of ileal loops to 3.5 cm in diameter. There is partial decompression of more distal small bowel loops. The appendix is normal in caliber, without evidence of appendicitis. Mild diverticulosis is noted along the  proximal sigmoid colon, without evidence of diverticulitis. Vascular/Lymphatic: Scattered calcification is seen along the abdominal aorta and its branches. The abdominal aorta is otherwise grossly unremarkable. The inferior vena cava is grossly unremarkable. No retroperitoneal lymphadenopathy is seen. No pelvic sidewall lymphadenopathy is identified. Reproductive: The bladder is mildly distended and grossly unremarkable. The prostate remains normal in size. Other: No additional soft tissue abnormalities are seen. Musculoskeletal: No acute osseous abnormalities are identified. Mild facet disease is noted at the lower lumbar spine. The visualized musculature is unremarkable in appearance. IMPRESSION: 1. Recurrent partial small-bowel obstruction at the level of a probable focal omental infarct and associated calcification and scarring at the lower abdomen, with distention of ileal loops to 3.5 cm in diameter. Partial decompression of more distal small bowel loops. 2. Mild diverticulosis along the proximal sigmoid colon, without evidence of diverticulitis. Aortic Atherosclerosis (ICD10-I70.0). Electronically Signed   By: JGarald BaldingM.D.   On: 07/20/2017 01:12    Review of Systems  Constitutional: Negative for chills, fever and weight loss.  Respiratory: Negative.   Cardiovascular: Negative.   Gastrointestinal: Positive for abdominal pain, heartburn, nausea and vomiting. Negative for constipation and diarrhea.  Genitourinary: Negative.   Neurological: Sensory change: .cmed.   Blood pressure 131/75, pulse 81, temperature 98.3 F (36.8 C), temperature source Oral, resp. rate 16, SpO2 99 %. Physical Exam General: Alert, well-developed Caucasian male, in no distress Skin: Warm and dry without rash or infection. HEENT: No palpable masses or thyromegaly. Sclera nonicteric. Pupils equal round and reactive. Lymph nodes: No cervical, supraclavicular, or inguinal nodes palpable. Lungs: Breath sounds clear and  equal without increased work of breathing Cardiovascular: Regular rate and rhythm without murmur. No JVD or edema.  Abdomen: Moderately distended.  A few high-pitched bowel sounds.  Well-healed  midline incision.  Mild lower abdominal tenderness without guarding.  No masses palpable. No organomegaly. No palpable hernias. Extremities: No edema or joint swelling or deformity. No chronic venous stasis changes. Neurologic: Alert and fully oriented.  Affect normal.  No gross motor deficits.  Assessment/Plan: Patient with history of laparotomy 2014 for GIST.  History of small bowel obstruction in 2015 and October this year that resolved spontaneously.  I reviewed his CT scan which shows a high-grade partial obstruction with an area of possible infarcted and calcified omentum adjacent to the obstruction possibly causing adhesions.  Patient will be admitted and NG placed and start small bowel protocol.  We will follow closely.  Darene Lamer Galia Rahm 07/20/2017, 3:40 AM

## 2017-07-20 NOTE — H&P (Signed)
History and Physical    Timothy Hall YDX:[id number] DOB: June 26, 1956 DOA: 07/19/2017  PCP: Dione Housekeeper, MD  Patient coming from: Home  I have personally briefly reviewed patient's old medical records in [place]  Chief Complaint: Abd pain, N/V  HPI: Timothy Hall is a 61 y.o. male with medical history significant of resection of GIST, post op Gleevec therapy after gastric partial resection in 2014.  1 year after surgery had episode of SBO that spontaneously resolved.  He did well until October this year, admitted with PSBO, resolved on its own after day or 2 of NG suction.  Had been doing well until 12-16 hours ago, developed lower abd pain, nausea, 2 episodes of vomiting.  No BMs since onset of symptoms.   ED Course: CT shows PSBO.   Review of Systems: As per HPI otherwise 10 point review of systems negative.   Past Medical History:  Diagnosis Date  . Cancer ([place])   . Complication of anesthesia    slow to awaken  . Gastric neoplasm    Cystic, 18 cm - recently diagnosed October 2014   . GERD (gastroesophageal reflux disease)   . Heart murmur age 89  . History of alcoholism ([place])    Quit drinking in 1985  . Pneumonia age 50   hx of  . Rash    upper back due to gleevac  . Sleep apnea    Intolerant of CPAP    Past Surgical History:  Procedure Laterality Date  . breast tumor removed Right 1976  . COLONOSCOPY N/A 11/20/2014   Procedure: COLONOSCOPY;  Surgeon: [name], MD;  [facility];  Service: Endoscopy;  Laterality: N/A;  1030 - moved to 5/2 @ 10:30  . EUS N/A 05/25/2013   Procedure: ESOPHAGEAL ENDOSCOPIC ULTRASOUND (EUS) RADIAL;  Surgeon: [name], MD;  [facility];  Service: Endoscopy;  Laterality: N/A;  . EUS N/A 07/12/2014   EGD by Dr. [name]: small hh, widely patent Schatzki's ring. evidence of prior small gastric wedge resection along body of stomach. no evidence of recurrent tumor.   [name] FINE NEEDLE ASPIRATION N/A  05/25/2013   Procedure: FINE NEEDLE ASPIRATION (FNA) LINEAR;  Surgeon: [name], MD;  [facility];  Service: Endoscopy;  Laterality: N/A;"this part never happend"  . LAPAROSCOPIC GASTRECTOMY N/A 07/19/2013   Procedure: RESECTION OF RETROGASTRIC CYSTIC NEOPLASM WITH PARTIAL GASTRECTOMY;  Surgeon: [name], MD;  Location: [facility];  Service: General;  Laterality: N/A;  . lipoma removed from back  yrs ago  . TUMOR REMOVAL     Benign tumor removed from left breast and back area.      reports that he has been smoking cigarettes.  He has a 40.00 pack-year smoking history. he has never used smokeless tobacco. He reports that he does not drink alcohol or use drugs.  Allergies  Allergen Reactions  . Nicoderm [Nicotine] Rash    Family History  Problem Relation Age of Onset  . Breast cancer Mother   . CAD Brother        Premature disease  . Colon cancer Neg Hx   . Stomach cancer Neg Hx      Prior to Admission medications   Medication Sig Start Date End Date Taking? Authorizing Provider  docusate sodium (COLACE) 100 MG capsule Take 1 capsule (100 mg total) by mouth 2 (two) times daily. 05/19/17  Yes Focht, Jessica L, PA  omeprazole (PRILOSEC OTC) 20 MG tablet Take 20  mg by mouth daily as needed (for acid reflex).   Yes [provider]  polyethylene glycol (MIRALAX / GLYCOLAX) packet Take 17 g by mouth daily as needed for mild constipation. 05/19/17  Yes Kalman Drape, PA    Physical Exam: Vitals:   07/20/17 0058 07/20/17 0100 07/20/17 0200 07/20/17 0300  BP: 131/75 (!) 145/74 140/64 (!) 142/74  Pulse: 81 78 67 67  Resp: 16     Temp:      TempSrc:      SpO2: 99% 98% 97% 96%    Constitutional: NAD, calm, comfortable Eyes: PERRL, lids and conjunctivae normal ENMT: Mucous membranes are moist. Posterior pharynx clear of any exudate or lesions.Normal dentition.  Neck: normal, supple, no masses, no thyromegaly Respiratory: clear to auscultation  bilaterally, no wheezing, no crackles. Normal respiratory effort. No accessory muscle use.  Cardiovascular: Regular rate and rhythm, no murmurs / rubs / gallops. No extremity edema. 2+ pedal pulses. No carotid bruits.  Abdomen: no tenderness, no masses palpated. No hepatosplenomegaly. Bowel sounds positive.  Musculoskeletal: no clubbing / cyanosis. No joint deformity upper and lower extremities. Good ROM, no contractures. Normal muscle tone.  Skin: no rashes, lesions, ulcers. No induration Neurologic: CN 2-12 grossly intact. Sensation intact, DTR normal. Strength 5/5 in all 4.  Psychiatric: Normal judgment and insight. Alert and oriented x 3. Normal mood.    Labs on Admission: I have personally reviewed following labs and imaging studies  CBC: Recent Labs  Lab 07/19/17 2007  WBC 13.1*  HGB 14.2  HCT 42.5  MCV 90.6  PLT 425   Basic Metabolic Panel: Recent Labs  Lab 07/19/17 2007  NA 135  K 4.5  CL 105  CO2 24  GLUCOSE 126*  BUN 14  CREATININE 1.05  CALCIUM 9.1   GFR: CrCl cannot be calculated (Unknown ideal weight.). Liver Function Tests: Recent Labs  Lab 07/19/17 2007  AST 28  ALT 35  ALKPHOS 90  BILITOT 0.5  PROT 7.1  ALBUMIN 3.8   Recent Labs  Lab 07/19/17 2007  LIPASE 32   No results for input(s): AMMONIA in the last 168 hours. Coagulation Profile: No results for input(s): INR, PROTIME in the last 168 hours. Cardiac Enzymes: No results for input(s): CKTOTAL, CKMB, CKMBINDEX, TROPONINI in the last 168 hours. BNP (last 3 results) No results for input(s): PROBNP in the last 8760 hours. HbA1C: No results for input(s): HGBA1C in the last 72 hours. CBG: No results for input(s): GLUCAP in the last 168 hours. Lipid Profile: No results for input(s): CHOL, HDL, LDLCALC, TRIG, CHOLHDL, LDLDIRECT in the last 72 hours. Thyroid Function Tests: No results for input(s): TSH, T4TOTAL, FREET4, T3FREE, THYROIDAB in the last 72 hours. Anemia Panel: No results for  input(s): VITAMINB12, FOLATE, FERRITIN, TIBC, IRON, RETICCTPCT in the last 72 hours. Urine analysis:    Component Value Date/Time   COLORURINE YELLOW 07/19/2017 2015   APPEARANCEUR CLEAR 07/19/2017 2015   LABSPEC 1.026 07/19/2017 2015   LABSPEC 1.025 01/27/2014 1455   PHURINE 6.0 07/19/2017 2015   GLUCOSEU NEGATIVE 07/19/2017 2015   GLUCOSEU Negative 01/27/2014 1455   HGBUR NEGATIVE 07/19/2017 2015   BILIRUBINUR NEGATIVE 07/19/2017 2015   BILIRUBINUR Negative 01/27/2014 1455   KETONESUR 5 (A) 07/19/2017 2015   PROTEINUR 30 (A) 07/19/2017 2015   UROBILINOGEN 0.2 01/27/2014 1455   NITRITE NEGATIVE 07/19/2017 2015   LEUKOCYTESUR NEGATIVE 07/19/2017 2015   LEUKOCYTESUR Negative 01/27/2014 1455    Radiological Exams on Admission: Ct Abdomen Pelvis W  Contrast  Result Date: 07/20/2017 CLINICAL DATA:  Acute onset of nausea and vomiting. Streaks of blood noted within the emesis. Leukocytosis. Felt pop at the lower abdomen. Initial encounter. EXAM: CT ABDOMEN AND PELVIS WITH CONTRAST TECHNIQUE: Multidetector CT imaging of the abdomen and pelvis was performed using the standard protocol following bolus administration of intravenous contrast. CONTRAST:  166mL ISOVUE-300 IOPAMIDOL (ISOVUE-300) INJECTION 61% COMPARISON:  CT of the abdomen and pelvis from 05/18/2017 FINDINGS: Lower chest: The visualized lung bases are grossly clear. The visualized portions of the mediastinum are unremarkable. Hepatobiliary: The liver is unremarkable in appearance. The gallbladder is unremarkable in appearance. The common bile duct remains normal in caliber. Pancreas: The pancreas is within normal limits. Spleen: The spleen is unremarkable in appearance. Adrenals/Urinary Tract: The adrenal glands are unremarkable in appearance. A small left renal cyst is noted. Mild nonspecific perinephric stranding is noted bilaterally. There is no evidence of hydronephrosis. No renal or ureteral stones are identified. Stomach/Bowel:  Postoperative change is noted at the antrum of the stomach. The stomach is otherwise unremarkable. There is recurrent partial small-bowel obstruction at the level of a probable focal omental infarct and associated calcification and scarring at the lower abdomen, with distention of ileal loops to 3.5 cm in diameter. There is partial decompression of more distal small bowel loops. The appendix is normal in caliber, without evidence of appendicitis. Mild diverticulosis is noted along the proximal sigmoid colon, without evidence of diverticulitis. Vascular/Lymphatic: Scattered calcification is seen along the abdominal aorta and its branches. The abdominal aorta is otherwise grossly unremarkable. The inferior vena cava is grossly unremarkable. No retroperitoneal lymphadenopathy is seen. No pelvic sidewall lymphadenopathy is identified. Reproductive: The bladder is mildly distended and grossly unremarkable. The prostate remains normal in size. Other: No additional soft tissue abnormalities are seen. Musculoskeletal: No acute osseous abnormalities are identified. Mild facet disease is noted at the lower lumbar spine. The visualized musculature is unremarkable in appearance. IMPRESSION: 1. Recurrent partial small-bowel obstruction at the level of a probable focal omental infarct and associated calcification and scarring at the lower abdomen, with distention of ileal loops to 3.5 cm in diameter. Partial decompression of more distal small bowel loops. 2. Mild diverticulosis along the proximal sigmoid colon, without evidence of diverticulitis. Aortic Atherosclerosis (ICD10-I70.0). Electronically Signed   By: Garald Balding M.D.   On: 07/20/2017 01:12    EKG: Independently reviewed.  Assessment/Plan Principal Problem:   Partial small bowel obstruction (HCC) Active Problems:   Gastric neoplasm s/p partial gastrectomy 07/19/2013    1. PSBO - 1. Patient got put on SBO protocol by Dr. [name] 2. NGT in  place 3. NPO 4. IVF: NS at 100 cc/hr  DVT prophylaxis: Lovenox Code Status: Full Family Communication: No family in room Disposition Plan: Home after admit Consults called: Dr. [name] Admission status: Admit to inpatient - inpatient status for SBO with NTG treatment   Etta Quill DO [facility] Pager [phone]  If 7AM-7PM, please contact day team taking care of patient [email or web address] Password [email or web address]  07/20/2017, 4:13 AM

## 2017-07-20 NOTE — Progress Notes (Signed)
[facility] PROGRESS NOTE    Progress Note  Timothy Hall  HAL:[id number] DOB: 23-Nov-1955 DOA: 07/19/2017 PCP: Dione Housekeeper, MD     Brief Narrative:   Timothy Hall is an 61 y.o. male history of small bowel obstruction comes in with a new small bowel obstruction.  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 Patient had 3 bowel movements this morning. Surgery following. Repeat x-ray in the morning.  DVT prophylaxis: lovenox Family Communication:wife Disposition Plan/Barrier to D/C: home in 1-2 days Code Status:     Code Status Orders  (From admission, onward)        Start     Ordered   07/20/17 0420  Full code  Continuous     07/20/17 0419    Code Status History    Date Active Date Inactive Code Status Order ID Comments User Context   05/18/2017 11:10 05/19/2017 20:19 Full Code [id number]  [facility] Ang ED   07/19/2014 02:24 07/23/2014 18:54 Full Code [id number]  Rise Patience, MD Inpatient   07/19/2013 13:53 07/26/2013 12:12 Full Code [id number]  Fanny Skates, MD Inpatient   05/14/2013 00:10 05/14/2013 18:21 Full Code [id number]  Oswald Hillock, MD Inpatient        IV Access:    Peripheral IV   Procedures and diagnostic studies:   Ct Abdomen Pelvis W Contrast  Result Date: 07/20/2017 CLINICAL DATA:  Acute onset of nausea and vomiting. Streaks of blood noted within the emesis. Leukocytosis. Felt pop at the lower abdomen. Initial encounter. EXAM: CT ABDOMEN AND PELVIS WITH CONTRAST TECHNIQUE: Multidetector CT imaging of the abdomen and pelvis was performed using the standard protocol following bolus administration of intravenous contrast. CONTRAST:  119mL ISOVUE-300 IOPAMIDOL (ISOVUE-300) INJECTION 61% COMPARISON:  CT  of the abdomen and pelvis from 05/18/2017 FINDINGS: Lower chest: The visualized lung bases are grossly clear. The visualized portions of the mediastinum are unremarkable. Hepatobiliary: The liver is unremarkable in appearance. The gallbladder is unremarkable in appearance. The common bile duct remains normal in caliber. Pancreas: The pancreas is within normal limits. Spleen: The spleen is unremarkable in appearance. Adrenals/Urinary Tract: The adrenal glands are unremarkable in appearance. A small left renal cyst is noted. Mild nonspecific perinephric stranding is noted bilaterally. There is no evidence of hydronephrosis. No renal or ureteral stones are identified. Stomach/Bowel: Postoperative change is noted at the antrum of the stomach. The stomach is otherwise unremarkable. There is recurrent partial small-bowel obstruction at the level of a probable focal omental infarct and associated calcification and scarring at the lower abdomen, with distention of ileal loops to 3.5 cm in diameter. There is partial decompression of more distal small bowel loops. The appendix is normal in caliber, without evidence of appendicitis. Mild diverticulosis is noted along the proximal sigmoid colon, without evidence of diverticulitis. Vascular/Lymphatic: Scattered calcification is seen along the abdominal aorta and its branches. The abdominal aorta is otherwise grossly unremarkable. The inferior vena cava is grossly unremarkable. No retroperitoneal lymphadenopathy is seen. No pelvic sidewall lymphadenopathy is identified. Reproductive: The bladder is mildly distended and grossly unremarkable. The prostate remains normal in size. Other: No additional soft tissue abnormalities are seen. Musculoskeletal: No acute osseous abnormalities are identified. Mild facet disease is noted at the lower lumbar spine. The  visualized musculature is unremarkable in appearance. IMPRESSION: 1. Recurrent partial small-bowel obstruction at the level of a  probable focal omental infarct and associated calcification and scarring at the lower abdomen, with distention of ileal loops to 3.5 cm in diameter. Partial decompression of more distal small bowel loops. 2. Mild diverticulosis along the proximal sigmoid colon, without evidence of diverticulitis. Aortic Atherosclerosis (ICD10-I70.0). Electronically Signed   By: Garald Balding M.D.   On: 07/20/2017 01:12   Dg Abd Portable 1v-small Bowel Protocol-position Verification  Result Date: 07/20/2017 CLINICAL DATA:  61 y/o  M; enteric tube placement. EXAM: PORTABLE ABDOMEN - 1 VIEW COMPARISON:  07/20/2017 CT abdomen and pelvis. FINDINGS: Small bowel obstruction with dilated small loops of bowel in the left hemiabdomen. Enteric tube projects coiling of proximal stomach. Mild lumbar levocurvature and lower lumbar spondylosis. Retained contrast in the renal collecting systems. IMPRESSION: Enteric tube projects coiling over the proximal stomach. Persistent small bowel obstruction. Electronically Signed   By: Kristine Garbe M.D.   On: 07/20/2017 05:04     Medical Consultants:    None.  Anti-Infectives:   None  Subjective:    Timothy Hall   Objective:    Vitals:   07/20/17 0300 07/20/17 0430 07/20/17 0600 07/20/17 0618  BP: (!) 142/74 (!) 163/99 (!) 167/74   Pulse: 67 73 (!) 58   Resp:  17 16   Temp:  98.3 F (36.8 C) 98.4 F (36.9 C)   TempSrc:  Oral Oral   SpO2: 96% 96% 99%   Weight:   88.5 kg (195 lb 1.7 oz)   Height:    6' (1.829 m)    Intake/Output Summary (Last 24 hours) at 07/20/2017 1137 Last data filed at 07/20/2017 1000 Gross per 24 hour  Intake 181.67 ml  Output 350 ml  Net -168.33 ml   Filed Weights   07/20/17 0600  Weight: 88.5 kg (195 lb 1.7 oz)    Exam: General exam: In no acute distress. Respiratory system: Good air movement and clear to auscultation. Cardiovascular system: S1 & S2 heard, RRR.  Gastrointestinal system: Abdomen is nondistended, soft  and nontender.  Central nervous system: Alert and oriented. No focal neurological deficits. Extremities: No pedal edema. Skin: No rashes, lesions or ulcers  Data Reviewed:    Labs: Basic Metabolic Panel: Recent Labs  Lab 07/19/17 2007  NA 135  K 4.5  CL 105  CO2 24  GLUCOSE 126*  BUN 14  CREATININE 1.05  CALCIUM 9.1   GFR Estimated Creatinine Clearance: 82.1 mL/min (by C-G formula based on SCr of 1.05 mg/dL). Liver Function Tests: Recent Labs  Lab 07/19/17 2007  AST 28  ALT 35  ALKPHOS 90  BILITOT 0.5  PROT 7.1  ALBUMIN 3.8   Recent Labs  Lab 07/19/17 2007  LIPASE 32   No results for input(s): AMMONIA in the last 168 hours. Coagulation profile No results for input(s): INR, PROTIME in the last 168 hours.  CBC: Recent Labs  Lab 07/19/17 2007  WBC 13.1*  HGB 14.2  HCT 42.5  MCV 90.6  PLT 300   Cardiac Enzymes: No results for input(s): CKTOTAL, CKMB, CKMBINDEX, TROPONINI in the last 168 hours. BNP (last 3 results) No results for input(s): PROBNP in the last 8760 hours. CBG: No results for input(s): GLUCAP in the last 168 hours. D-Dimer: No results for input(s): DDIMER in the last 72 hours. Hgb A1c: No results for input(s): HGBA1C in the last 72 hours. Lipid Profile: No results for  input(s): CHOL, HDL, LDLCALC, TRIG, CHOLHDL, LDLDIRECT in the last 72 hours. Thyroid function studies: No results for input(s): TSH, T4TOTAL, T3FREE, THYROIDAB in the last 72 hours.  Invalid input(s): FREET3 Anemia work up: No results for input(s): VITAMINB12, FOLATE, FERRITIN, TIBC, IRON, RETICCTPCT in the last 72 hours. Sepsis Labs: Recent Labs  Lab 07/19/17 2007  WBC 13.1*   Microbiology No results found for this or any previous visit (from the past 240 hour(s)).   Medications:   . enoxaparin (LOVENOX) injection  40 mg Subcutaneous Q24H  . iopamidol      . lip balm  1 application Topical BID   Continuous Infusions: . sodium chloride 100 mL/hr at  07/20/17 0428  . lactated ringers    . methocarbamol (ROBAXIN)  IV        LOS: 0 days   Charlynne Cousins  [facility] Pager [phone]  *Please refer to [email or web address], password [email or web address] to get updated schedule on who will round on this patient, as hospitalists switch teams weekly. If 7PM-7AM, please contact night-coverage at [email or web address], password [email or web address] for any overnight needs.  07/20/2017, 11:37 AM

## 2017-07-21 NOTE — Progress Notes (Signed)
NG tube removed per MD order. Patient tolerated well.

## 2017-07-21 NOTE — Progress Notes (Signed)
[facility] PROGRESS NOTE    Progress Note  Timothy Hall  ZOX:[id number] DOB: 1956/04/23 DOA: 07/19/2017 PCP: Dione Housekeeper, MD     Brief Narrative:   Timothy Hall is an 62 y.o. male history of small bowel obstruction comes in with a new small bowel obstruction.  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 Advance as tolerate it.  DVT prophylaxis: lovenox Family Communication:wife Disposition Plan/Barrier to D/C: home in 1-2 days Code Status:     Code Status Orders  (From admission, onward)        Start     Ordered   07/20/17 0420  Full code  Continuous     07/20/17 0419    Code Status History    Date Active Date Inactive Code Status Order ID Comments User Context   05/18/2017 11:10 05/19/2017 20:19 Full Code [id number]  [facility] ED   07/19/2014 02:24 07/23/2014 18:54 Full Code [id number]  Rise Patience, MD Inpatient   07/19/2013 13:53 07/26/2013 12:12 Full Code [id number]  Fanny Skates, MD Inpatient   05/14/2013 00:10 05/14/2013 18:21 Full Code [id number]  Oswald Hillock, MD Inpatient        IV Access:    Peripheral IV   Procedures and diagnostic studies:   Ct Abdomen Pelvis W Contrast  Result Date: 07/20/2017 CLINICAL DATA:  Acute onset of nausea and vomiting. Streaks of blood noted within the emesis. Leukocytosis. Felt pop at the lower abdomen. Initial encounter. EXAM: CT ABDOMEN AND PELVIS WITH CONTRAST TECHNIQUE: Multidetector CT imaging of the abdomen and pelvis was performed using the standard protocol following bolus administration of intravenous contrast. CONTRAST:  166mL ISOVUE-300 IOPAMIDOL (ISOVUE-300) INJECTION 61% COMPARISON:  CT of the abdomen and pelvis from 05/18/2017 FINDINGS: Lower chest: The visualized lung  bases are grossly clear. The visualized portions of the mediastinum are unremarkable. Hepatobiliary: The liver is unremarkable in appearance. The gallbladder is unremarkable in appearance. The common bile duct remains normal in caliber. Pancreas: The pancreas is within normal limits. Spleen: The spleen is unremarkable in appearance. Adrenals/Urinary Tract: The adrenal glands are unremarkable in appearance. A small left renal cyst is noted. Mild nonspecific perinephric stranding is noted bilaterally. There is no evidence of hydronephrosis. No renal or ureteral stones are identified. Stomach/Bowel: Postoperative change is noted at the antrum of the stomach. The stomach is otherwise unremarkable. There is recurrent partial small-bowel obstruction at the level of a probable focal omental infarct and associated calcification and scarring at the lower abdomen, with distention of ileal loops to 3.5 cm in diameter. There is partial decompression of more distal small bowel loops. The appendix is normal in caliber, without evidence of appendicitis. Mild diverticulosis is noted along the proximal sigmoid colon, without evidence of diverticulitis. Vascular/Lymphatic: Scattered calcification is seen along the abdominal aorta and its branches. The abdominal aorta is otherwise grossly unremarkable. The inferior vena cava is grossly unremarkable. No retroperitoneal lymphadenopathy is seen. No pelvic sidewall lymphadenopathy is identified. Reproductive: The bladder is mildly distended and grossly unremarkable. The prostate remains normal in size. Other: No additional soft tissue abnormalities are seen. Musculoskeletal: No acute osseous abnormalities are identified. Mild facet disease is noted at the lower lumbar spine. The visualized musculature is unremarkable in appearance. IMPRESSION: 1. Recurrent partial small-bowel obstruction at  the level of a probable focal omental infarct and associated calcification and scarring at the  lower abdomen, with distention of ileal loops to 3.5 cm in diameter. Partial decompression of more distal small bowel loops. 2. Mild diverticulosis along the proximal sigmoid colon, without evidence of diverticulitis. Aortic Atherosclerosis (ICD10-I70.0). Electronically Signed   By: Garald Balding M.D.   On: 07/20/2017 01:12   Dg Abd Portable 1v-small Bowel Obstruction Protocol-initial, 8 Hr Delay  Result Date: 07/20/2017 CLINICAL DATA:  Small bowel obstruction protocol. 8 hour delayed image. EXAM: PORTABLE ABDOMEN - 1 VIEW COMPARISON:  Earlier today. FINDINGS: Nasogastric tube coiled in the medial aspect of the left upper abdomen. The previously demonstrated dilated small bowel loops are no longer seen. Currently, there is gas and with contrast in normal caliber right, transverse and proximal descending colon. Mild levoconvex lumbar rotary scoliosis. IMPRESSION: Resolved pattern of small bowel obstruction. Nasogastric tube coiled in the proximal stomach. Electronically Signed   By: Claudie Revering M.D.   On: 07/20/2017 16:29   Dg Abd Portable 1v-small Bowel Protocol-position Verification  Result Date: 07/20/2017 CLINICAL DATA:  62 y/o  M; enteric tube placement. EXAM: PORTABLE ABDOMEN - 1 VIEW COMPARISON:  07/20/2017 CT abdomen and pelvis. FINDINGS: Small bowel obstruction with dilated small loops of bowel in the left hemiabdomen. Enteric tube projects coiling of proximal stomach. Mild lumbar levocurvature and lower lumbar spondylosis. Retained contrast in the renal collecting systems. IMPRESSION: Enteric tube projects coiling over the proximal stomach. Persistent small bowel obstruction. Electronically Signed   By: Kristine Garbe M.D.   On: 07/20/2017 05:04     Medical Consultants:    None.  Anti-Infectives:   None  Subjective:    [name] no complains, multiple BM  Objective:    Vitals:   07/20/17 0618 07/20/17 1540 07/20/17 2118 07/21/17 0616  BP:  (!) 154/75 (!)  147/68 (!) 162/56  Pulse:  67 69 66  Resp:  20 16 18   Temp:  98 F (36.7 C) 98.2 F (36.8 C) 98.1 F (36.7 C)  TempSrc:  Oral Oral Oral  SpO2:  97% 97% 98%  Weight:      Height: 6' (1.829 m)       Intake/Output Summary (Last 24 hours) at 07/21/2017 1056 Last data filed at 07/21/2017 0719 Gross per 24 hour  Intake 0 ml  Output -  Net 0 ml   Filed Weights   07/20/17 0600  Weight: 88.5 kg (195 lb 1.7 oz)    Exam: General exam: In no acute distress. Respiratory system: Good air movement and clear to auscultation. Cardiovascular system: S1 & S2 heard, RRR.  Gastrointestinal system: Abdomen is nondistended, soft and nontender.  Central nervous system: Alert and oriented. No focal neurological deficits. Extremities: No pedal edema. Skin: No rashes, lesions or ulcers  Data Reviewed:    Labs: Basic Metabolic Panel: Recent Labs  Lab 07/19/17 2007  NA 135  K 4.5  CL 105  CO2 24  GLUCOSE 126*  BUN 14  CREATININE 1.05  CALCIUM 9.1   GFR Estimated Creatinine Clearance: 82.1 mL/min (by C-G formula based on SCr of 1.05 mg/dL). Liver Function Tests: Recent Labs  Lab 07/19/17 2007  AST 28  ALT 35  ALKPHOS 90  BILITOT 0.5  PROT 7.1  ALBUMIN 3.8   Recent Labs  Lab 07/19/17 2007  LIPASE 32   No results for input(s): AMMONIA in the last 168 hours. Coagulation profile No results for input(s): INR, PROTIME in the  last 168 hours.  CBC: Recent Labs  Lab 07/19/17 2007  WBC 13.1*  HGB 14.2  HCT 42.5  MCV 90.6  PLT 300   Cardiac Enzymes: No results for input(s): CKTOTAL, CKMB, CKMBINDEX, TROPONINI in the last 168 hours. BNP (last 3 results) No results for input(s): PROBNP in the last 8760 hours. CBG: No results for input(s): GLUCAP in the last 168 hours. D-Dimer: No results for input(s): DDIMER in the last 72 hours. Hgb A1c: No results for input(s): HGBA1C in the last 72 hours. Lipid Profile: No results for input(s): CHOL, HDL, LDLCALC, TRIG, CHOLHDL,  LDLDIRECT in the last 72 hours. Thyroid function studies: No results for input(s): TSH, T4TOTAL, T3FREE, THYROIDAB in the last 72 hours.  Invalid input(s): FREET3 Anemia work up: No results for input(s): VITAMINB12, FOLATE, FERRITIN, TIBC, IRON, RETICCTPCT in the last 72 hours. Sepsis Labs: Recent Labs  Lab 07/19/17 2007  WBC 13.1*   Microbiology No results found for this or any previous visit (from the past 240 hour(s)).   Medications:   . enoxaparin (LOVENOX) injection  40 mg Subcutaneous Q24H  . lip balm  1 application Topical BID   Continuous Infusions: . sodium chloride 100 mL/hr at 07/21/17 1035  . lactated ringers    . methocarbamol (ROBAXIN)  IV        LOS: 1 day   [facility] Pager [phone]  *Please refer to [email or web address], password [email or web address] to get updated schedule on who will round on this patient, as hospitalists switch teams weekly. If 7PM-7AM, please contact night-coverage at [email or web address], password [email or web address] for any overnight needs.  07/21/2017, 10:56 AM

## 2017-07-21 NOTE — Progress Notes (Signed)
Patient encouraged to ambulate in the hallway.

## 2017-07-21 NOTE — Progress Notes (Signed)
Subjective/Chief Complaint: No complaints other than the ng. Had several bm's yesterday   Objective: Vital signs in last 24 hours: Temp:  [98 F (36.7 C)-98.2 F (36.8 C)] 98.1 F (36.7 C) (01/01 0616) Pulse Rate:  [66-69] 66 (01/01 0616) Resp:  [16-20] 18 (01/01 0616) BP: (147-162)/(56-75) 162/56 (01/01 0616) SpO2:  [97 %-98 %] 98 % (01/01 0616) Last BM Date: 07/21/17  Intake/Output from previous day: 12/31 0701 - 01/01 0700 In: 0  Out: 350 [Emesis/NG output:350] Intake/Output this shift: No intake/output data recorded.  General appearance: alert and cooperative Resp: clear to auscultation bilaterally Cardio: regular rate and rhythm GI: soft, nontender  Lab Results:  Recent Labs    07/19/17 2007  WBC 13.1*  HGB 14.2  HCT 42.5  PLT 300   BMET Recent Labs    07/19/17 2007  NA 135  K 4.5  CL 105  CO2 24  GLUCOSE 126*  BUN 14  CREATININE 1.05  CALCIUM 9.1   PT/INR No results for input(s): LABPROT, INR in the last 72 hours. ABG No results for input(s): PHART, HCO3 in the last 72 hours.  Invalid input(s): PCO2, PO2  Studies/Results: Ct Abdomen Pelvis W Contrast  Result Date: 07/20/2017 CLINICAL DATA:  Acute onset of nausea and vomiting. Streaks of blood noted within the emesis. Leukocytosis. Felt pop at the lower abdomen. Initial encounter. EXAM: CT ABDOMEN AND PELVIS WITH CONTRAST TECHNIQUE: Multidetector CT imaging of the abdomen and pelvis was performed using the standard protocol following bolus administration of intravenous contrast. CONTRAST:  166mL ISOVUE-300 IOPAMIDOL (ISOVUE-300) INJECTION 61% COMPARISON:  CT of the abdomen and pelvis from 05/18/2017 FINDINGS: Lower chest: The visualized lung bases are grossly clear. The visualized portions of the mediastinum are unremarkable. Hepatobiliary: The liver is unremarkable in appearance. The gallbladder is unremarkable in appearance. The common bile duct remains normal in caliber. Pancreas: The  pancreas is within normal limits. Spleen: The spleen is unremarkable in appearance. Adrenals/Urinary Tract: The adrenal glands are unremarkable in appearance. A small left renal cyst is noted. Mild nonspecific perinephric stranding is noted bilaterally. There is no evidence of hydronephrosis. No renal or ureteral stones are identified. Stomach/Bowel: Postoperative change is noted at the antrum of the stomach. The stomach is otherwise unremarkable. There is recurrent partial small-bowel obstruction at the level of a probable focal omental infarct and associated calcification and scarring at the lower abdomen, with distention of ileal loops to 3.5 cm in diameter. There is partial decompression of more distal small bowel loops. The appendix is normal in caliber, without evidence of appendicitis. Mild diverticulosis is noted along the proximal sigmoid colon, without evidence of diverticulitis. Vascular/Lymphatic: Scattered calcification is seen along the abdominal aorta and its branches. The abdominal aorta is otherwise grossly unremarkable. The inferior vena cava is grossly unremarkable. No retroperitoneal lymphadenopathy is seen. No pelvic sidewall lymphadenopathy is identified. Reproductive: The bladder is mildly distended and grossly unremarkable. The prostate remains normal in size. Other: No additional soft tissue abnormalities are seen. Musculoskeletal: No acute osseous abnormalities are identified. Mild facet disease is noted at the lower lumbar spine. The visualized musculature is unremarkable in appearance. IMPRESSION: 1. Recurrent partial small-bowel obstruction at the level of a probable focal omental infarct and associated calcification and scarring at the lower abdomen, with distention of ileal loops to 3.5 cm in diameter. Partial decompression of more distal small bowel loops. 2. Mild diverticulosis along the proximal sigmoid colon, without evidence of diverticulitis. Aortic Atherosclerosis (ICD10-I70.0).  Electronically Signed  By: Garald Balding M.D.   On: 07/20/2017 01:12   Dg Abd Portable 1v-small Bowel Obstruction Protocol-initial, 8 Hr Delay  Result Date: 07/20/2017 CLINICAL DATA:  Small bowel obstruction protocol. 8 hour delayed image. EXAM: PORTABLE ABDOMEN - 1 VIEW COMPARISON:  Earlier today. FINDINGS: Nasogastric tube coiled in the medial aspect of the left upper abdomen. The previously demonstrated dilated small bowel loops are no longer seen. Currently, there is gas and with contrast in normal caliber right, transverse and proximal descending colon. Mild levoconvex lumbar rotary scoliosis. IMPRESSION: Resolved pattern of small bowel obstruction. Nasogastric tube coiled in the proximal stomach. Electronically Signed   By: Claudie Revering M.D.   On: 07/20/2017 16:29   Dg Abd Portable 1v-small Bowel Protocol-position Verification  Result Date: 07/20/2017 CLINICAL DATA:  62 y/o  M; enteric tube placement. EXAM: PORTABLE ABDOMEN - 1 VIEW COMPARISON:  07/20/2017 CT abdomen and pelvis. FINDINGS: Small bowel obstruction with dilated small loops of bowel in the left hemiabdomen. Enteric tube projects coiling of proximal stomach. Mild lumbar levocurvature and lower lumbar spondylosis. Retained contrast in the renal collecting systems. IMPRESSION: Enteric tube projects coiling over the proximal stomach. Persistent small bowel obstruction. Electronically Signed   By: Kristine Garbe M.D.   On: 07/20/2017 05:04    Anti-infectives: Anti-infectives (From admission, onward)   None      Assessment/Plan: s/p * No surgery found * Advance diet. Start clears D/c ng Ambulate sbo seems to be resolving  LOS: 1 day    TOTH III,PAUL S 07/21/2017

## 2017-07-22 DIAGNOSIS — K56609 Unspecified intestinal obstruction, unspecified as to partial versus complete obstruction: Secondary | ICD-10-CM

## 2017-07-22 NOTE — Discharge Instructions (Signed)
Timothy Hall was admitted to the Hospital on 07/19/2017 and Discharged on Discharge Date 07/22/2017 and should be excused from work/school   for 5   days starting 07/19/2017 , may return to work/school without any restrictions.  Call Timothy Harvest MD, [facility] [phone] with questions.  Timothy Hall M.D on 07/22/2017,at 4:36 PM  [facility] Office  [phone]

## 2017-07-22 NOTE — Care Management Note (Signed)
Case Management Note  Patient Details  Name: Timothy Hall MRN: [id number] Date of Birth: [birth year]  Subjective/Objective:   No CM needs.                 Action/Plan:d/c home.   Expected Discharge Date:  07/22/17               Expected Discharge Plan:  Home/Self Care  In-House Referral:     Discharge planning Services  CM Consult  Post Acute Care Choice:    Choice offered to:     DME Arranged:    DME Agency:     HH Arranged:    HH Agency:     Status of Service:  Completed, signed off  If discussed at [employer] of Stay Meetings, dates discussed:    Additional Comments:  Dessa Phi, RN 07/22/2017, 12:29 PM

## 2017-07-22 NOTE — Discharge Summary (Signed)
Physician Discharge Summary  Timothy Hall UJW:[id number] DOB: 03-29-1956 DOA: 07/19/2017  PCP: Dione Housekeeper, MD  Admit date: 07/19/2017 Discharge date: 07/22/2017  Admitted From: home Disposition:  Home  Recommendations for Outpatient Follow-up:  1. 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  After 24 hours he was passing gas and having bowel movement NG tube was DC'd he was tolerated his diet. We will follow-up with surgery in 2-4 weeks.  Discharge Instructions  Discharge Instructions    Diet - low sodium heart healthy   Complete by:  As directed    Increase activity slowly   Complete by:  As directed      Allergies as of 07/22/2017      Reactions   Nicoderm [nicotine] Rash      Medication List    TAKE these medications   docusate sodium 100 MG capsule Commonly known as:  COLACE Take 1 capsule (100 mg total) by mouth 2 (two) times daily.   omeprazole 20 MG tablet Commonly known as:  PRILOSEC OTC Take 20 mg by mouth daily as needed (for acid reflex).   polyethylene glycol packet Commonly known as:  MIRALAX / GLYCOLAX Take 17 g by mouth daily as needed for mild constipation.       Allergies  Allergen Reactions  . Nicoderm [Nicotine] Rash    Consultations:  Surgery   Procedures/Studies: Ct Abdomen Pelvis W Contrast  Result Date: 07/20/2017 CLINICAL DATA:  Acute onset of nausea and vomiting. Streaks of blood noted within the emesis.  Leukocytosis. Felt pop at the lower abdomen. Initial encounter. EXAM: CT ABDOMEN AND PELVIS WITH CONTRAST TECHNIQUE: Multidetector CT imaging of the abdomen and pelvis was performed using the standard protocol following bolus administration of intravenous contrast. CONTRAST:  129mL ISOVUE-300 IOPAMIDOL (ISOVUE-300) INJECTION 61% COMPARISON:  CT of the abdomen and pelvis from 05/18/2017 FINDINGS: Lower chest: The visualized lung bases are grossly clear. The visualized portions of the mediastinum are unremarkable. Hepatobiliary: The liver is unremarkable in appearance. The gallbladder is unremarkable in appearance. The common bile duct remains normal in caliber. Pancreas: The pancreas is within normal limits. Spleen: The spleen is unremarkable in appearance. Adrenals/Urinary Tract: The adrenal glands are unremarkable in appearance. A small left renal cyst is noted. Mild nonspecific perinephric stranding is noted bilaterally. There is no evidence of hydronephrosis. No renal or ureteral stones are identified. Stomach/Bowel: Postoperative change is noted at the antrum of the stomach. The stomach is otherwise unremarkable. There is recurrent partial small-bowel obstruction at the level of a probable focal omental infarct and associated calcification and scarring at the lower abdomen, with distention of ileal loops to 3.5 cm in diameter. There is partial decompression of more distal small bowel loops. The appendix is normal in caliber, without evidence of appendicitis. Mild diverticulosis is noted along the proximal sigmoid colon, without evidence of diverticulitis. Vascular/Lymphatic: Scattered calcification is seen along the abdominal aorta and its branches. The abdominal aorta is otherwise grossly unremarkable. The inferior vena cava is grossly unremarkable. No retroperitoneal lymphadenopathy  is seen. No pelvic sidewall lymphadenopathy is identified. Reproductive: The bladder is mildly distended and grossly  unremarkable. The prostate remains normal in size. Other: No additional soft tissue abnormalities are seen. Musculoskeletal: No acute osseous abnormalities are identified. Mild facet disease is noted at the lower lumbar spine. The visualized musculature is unremarkable in appearance. IMPRESSION: 1. Recurrent partial small-bowel obstruction at the level of a probable focal omental infarct and associated calcification and scarring at the lower abdomen, with distention of ileal loops to 3.5 cm in diameter. Partial decompression of more distal small bowel loops. 2. Mild diverticulosis along the proximal sigmoid colon, without evidence of diverticulitis. Aortic Atherosclerosis (ICD10-I70.0). Electronically Signed   By: Garald Balding M.D.   On: 07/20/2017 01:12   Dg Abd Portable 1v-small Bowel Obstruction Protocol-initial, 8 Hr Delay  Result Date: 07/20/2017 CLINICAL DATA:  Small bowel obstruction protocol. 8 hour delayed image. EXAM: PORTABLE ABDOMEN - 1 VIEW COMPARISON:  Earlier today. FINDINGS: Nasogastric tube coiled in the medial aspect of the left upper abdomen. The previously demonstrated dilated small bowel loops are no longer seen. Currently, there is gas and with contrast in normal caliber right, transverse and proximal descending colon. Mild levoconvex lumbar rotary scoliosis. IMPRESSION: Resolved pattern of small bowel obstruction. Nasogastric tube coiled in the proximal stomach. Electronically Signed   By: Claudie Revering M.D.   On: 07/20/2017 16:29   Dg Abd Portable 1v-small Bowel Protocol-position Verification  Result Date: 07/20/2017 CLINICAL DATA:  62 y/o  M; enteric tube placement. EXAM: PORTABLE ABDOMEN - 1 VIEW COMPARISON:  07/20/2017 CT abdomen and pelvis. FINDINGS: Small bowel obstruction with dilated small loops of bowel in the left hemiabdomen. Enteric tube projects coiling of proximal stomach. Mild lumbar levocurvature and lower lumbar spondylosis. Retained contrast in the renal collecting  systems. IMPRESSION: Enteric tube projects coiling over the proximal stomach. Persistent small bowel obstruction. Electronically Signed   By: Kristine Garbe M.D.   On: 07/20/2017 05:04    Subjective:  No complains Discharge Exam: Vitals:   07/21/17 2213 07/22/17 0654  BP: (!) 149/70 (!) 143/79  Pulse: 68 60  Resp: 17 18  Temp: 98.2 F (36.8 C) 97.8 F (36.6 C)  SpO2: 99% 98%   Vitals:   07/21/17 0616 07/21/17 1258 07/21/17 2213 07/22/17 0654  BP: (!) 162/56 (!) 156/66 (!) 149/70 (!) 143/79  Pulse: 66 66 68 60  Resp: 18 16 17 18   Temp: 98.1 F (36.7 C) 98.1 F (36.7 C) 98.2 F (36.8 C) 97.8 F (36.6 C)  TempSrc: Oral Oral Oral Oral  SpO2: 98% 97% 99% 98%  Weight:      Height:        General: Pt is alert, awake, not in acute distress Cardiovascular: RRR, S1/S2 +, no rubs, no gallops Respiratory: CTA bilaterally, no wheezing, no rhonchi Abdominal: Soft, NT, ND, bowel sounds + Extremities: no edema, no cyanosis    The results of significant diagnostics from this hospitalization (including imaging, microbiology, ancillary and laboratory) are listed below for reference.     Microbiology: No results found for this or any previous visit (from the past 240 hour(s)).   Labs: BNP (last 3 results) No results for input(s): BNP in the last 8760 hours. Basic Metabolic Panel: Recent Labs  Lab 07/19/17 2007  NA 135  K 4.5  CL 105  CO2 24  GLUCOSE 126*  BUN 14  CREATININE 1.05  CALCIUM 9.1   Liver Function Tests: Recent Labs  Lab 07/19/17 2007  AST  28  ALT 35  ALKPHOS 90  BILITOT 0.5  PROT 7.1  ALBUMIN 3.8   Recent Labs  Lab 07/19/17 2007  LIPASE 32   No results for input(s): AMMONIA in the last 168 hours. CBC: Recent Labs  Lab 07/19/17 2007  WBC 13.1*  HGB 14.2  HCT 42.5  MCV 90.6  PLT 300   Cardiac Enzymes: No results for input(s): CKTOTAL, CKMB, CKMBINDEX, TROPONINI in the last 168 hours. BNP: Invalid input(s): POCBNP CBG: No  results for input(s): GLUCAP in the last 168 hours. D-Dimer No results for input(s): DDIMER in the last 72 hours. Hgb A1c No results for input(s): HGBA1C in the last 72 hours. Lipid Profile No results for input(s): CHOL, HDL, LDLCALC, TRIG, CHOLHDL, LDLDIRECT in the last 72 hours. Thyroid function studies No results for input(s): TSH, T4TOTAL, T3FREE, THYROIDAB in the last 72 hours.  Invalid input(s): FREET3 Anemia work up No results for input(s): VITAMINB12, FOLATE, FERRITIN, TIBC, IRON, RETICCTPCT in the last 72 hours. Urinalysis    Component Value Date/Time   COLORURINE YELLOW 07/19/2017 2015   APPEARANCEUR CLEAR 07/19/2017 2015   LABSPEC 1.026 07/19/2017 2015   LABSPEC 1.025 01/27/2014 1455   PHURINE 6.0 07/19/2017 2015   GLUCOSEU NEGATIVE 07/19/2017 2015   GLUCOSEU Negative 01/27/2014 1455   HGBUR NEGATIVE 07/19/2017 2015   BILIRUBINUR NEGATIVE 07/19/2017 2015   BILIRUBINUR Negative 01/27/2014 1455   KETONESUR 5 (A) 07/19/2017 2015   PROTEINUR 30 (A) 07/19/2017 2015   UROBILINOGEN 0.2 01/27/2014 1455   NITRITE NEGATIVE 07/19/2017 2015   LEUKOCYTESUR NEGATIVE 07/19/2017 2015   LEUKOCYTESUR Negative 01/27/2014 1455   Sepsis Labs Invalid input(s): PROCALCITONIN,  WBC,  LACTICIDVEN Microbiology No results found for this or any previous visit (from the past 240 hour(s)).   Time coordinating discharge: Over 30 minutes  SIGNED:   Charlynne Cousins, MD  [facility] 07/22/2017, 10:40 AM Pager   If 7PM-7AM, please contact night-coverage [email or web address] Password [email or web address]

## 2017-07-28 ENCOUNTER — Telehealth: Payer: Self-pay

## 2017-07-28 NOTE — Telephone Encounter (Signed)
Unable to reach patient.   Called Central Scheduling to cancel CT scan on 08/14/17.

## 2017-08-07 ENCOUNTER — Telehealth: Payer: Self-pay

## 2017-08-07 NOTE — Telephone Encounter (Signed)
Returned pt call regarding appt for CT. Informed pt that scan appt has been cancelled since he had one done inpatient. Voiced understanding.

## 2017-08-12 ENCOUNTER — Telehealth: Payer: Self-pay | Admitting: *Deleted

## 2017-08-12 NOTE — Telephone Encounter (Signed)
Call from pt asking whether 1/25 appt is necessary. Informed him it is not. Lab appt will be canceled. Pt had labs and CT when hospitalized for bowel obstruction.

## 2017-08-14 ENCOUNTER — Other Ambulatory Visit: Payer: 59

## 2017-08-21 ENCOUNTER — Telehealth: Payer: Self-pay | Admitting: Oncology

## 2017-08-21 ENCOUNTER — Inpatient Hospital Stay: Payer: BLUE CROSS/BLUE SHIELD | Attending: Oncology | Admitting: Oncology

## 2017-08-21 VITALS — BP 151/74 | HR 68 | Temp 98.0°F | Resp 18 | Ht 72.0 in | Wt 193.5 lb

## 2017-08-21 DIAGNOSIS — C49A0 Gastrointestinal stromal tumor, unspecified site: Secondary | ICD-10-CM | POA: Diagnosis not present

## 2017-08-21 DIAGNOSIS — K565 Intestinal adhesions [bands], unspecified as to partial versus complete obstruction: Secondary | ICD-10-CM | POA: Diagnosis not present

## 2017-08-21 DIAGNOSIS — Z23 Encounter for immunization: Secondary | ICD-10-CM

## 2017-08-21 DIAGNOSIS — F17210 Nicotine dependence, cigarettes, uncomplicated: Secondary | ICD-10-CM | POA: Diagnosis not present

## 2017-08-21 MED ORDER — INFLUENZA VAC SPLIT QUAD 0.5 ML IM SUSY
0.5000 mL | PREFILLED_SYRINGE | Freq: Once | INTRAMUSCULAR | Status: AC
  Administered 2017-08-21: 0.5 mL via INTRAMUSCULAR
  Filled 2017-08-21: qty 0.5

## 2017-08-21 NOTE — Progress Notes (Signed)
[place] OFFICE PROGRESS NOTE   Diagnosis: Gastrointestinal stromal tumor  INTERVAL HISTORY:   Timothy Hall returns as scheduled.  He feels well.  Good appetite.  He continues to smoke approximately 1 pack of cigarettes per day.  No complaint. He was admitted with a small bowel obstruction in December.  His symptoms resolved with NG decompression. A CT of the abdomen and pelvis 07/20/2017 revealed postoperative changes at the stomach antrum.  A small bowel obstruction was noted at the level of a probable focal omental infarct and calcification in the lower abdomen.  Objective:  Vital signs in last 24 hours:  Blood pressure (!) 151/74, pulse 68, temperature 98 F (36.7 C), temperature source Oral, resp. rate 18, height 6' (1.829 m), weight 193 lb 8 oz (87.8 kg), SpO2 100 %.    HEENT: Neck without mass Lymphatics: No cervical, supraclavicular, axillary, or inguinal nodes Resp: Lungs clear bilaterally Cardio: Regular rate and rhythm GI: No mass, no hepatosplenomegaly, nontender Vascular: No leg edema   Lab Results:  Lab Results  Component Value Date   WBC 13.1 (H) 07/19/2017   HGB 14.2 07/19/2017   HCT 42.5 07/19/2017   MCV 90.6 07/19/2017   PLT 300 07/19/2017   NEUTROABS 6.5 05/18/2017    CMP     Component Value Date/Time   NA 135 07/19/2017 2007   NA 141 08/04/2016 1224   K 4.5 07/19/2017 2007   K 3.9 08/04/2016 1224   CL 105 07/19/2017 2007   CO2 24 07/19/2017 2007   CO2 25 08/04/2016 1224   GLUCOSE 126 (H) 07/19/2017 2007   GLUCOSE 120 08/04/2016 1224   BUN 14 07/19/2017 2007   BUN 15.9 08/04/2016 1224   CREATININE 1.05 07/19/2017 2007   CREATININE 1.1 08/04/2016 1224   CALCIUM 9.1 07/19/2017 2007   CALCIUM 9.4 08/04/2016 1224   PROT 7.1 07/19/2017 2007   PROT 7.4 08/04/2016 1224   ALBUMIN 3.8 07/19/2017 2007   ALBUMIN 3.8 08/04/2016 1224   AST 28 07/19/2017 2007   AST 20 08/04/2016 1224   ALT 35 07/19/2017 2007   ALT 23 08/04/2016  1224   ALKPHOS 90 07/19/2017 2007   ALKPHOS 104 08/04/2016 1224   BILITOT 0.5 07/19/2017 2007   BILITOT 0.36 08/04/2016 1224   GFRNONAA >60 07/19/2017 2007   GFRAA >60 07/19/2017 2007     Medications: I have reviewed the patient's current medications.   Assessment/Plan: 1. Gastrointestinal stromal tumor stage II (T4 NX), most likely arising from the stomach, status post surgical resection including a partial gastrectomy on 07/19/2013, 19 cm cystic mass with a low mitotic rate.  Initiation of adjuvant Gleevec 08/31/2013.   Gleevec dose reduced to 400 mg every other day beginning 10/18/2013 secondary to neutropenia.   Gleevec dose adjusted to 200 mg daily beginning 11/29/2013.   Gleevec dose adjusted to 300 mg daily beginning 12/13/2013.  Upper endoscopy 07/12/2014 without evidence of recurrent tumor in the stomach 2. Mild skin rash most likely secondary to [place]. 3. History of Mild periorbital edema secondary to [place]. 4. Severe neutropenia secondary to [facility] 10/12/2013. Resolved. 5. History of Mild lower extremity edema likely related to [place]. 6. Admission 07/18/2014 with a small bowel obstruction-resolved with bowel rest, CT 07/18/2014 without evidence of recurrent tumor. Follow-up CT 02/16/2015 with no evidence of gastric region mass or wall thickening. 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 7. Status post evaluation in the emergency department for abdominal pain 05/12/2016. CT with no  evidence for acute intra-abdominal or pelvic pathology. Sigmoid colon diverticulosis with no definite surrounding bowel inflammation. Slight decreased size of fat density masslike area within the upper pelvis thought to represent omental infarct; scatteredperipheral calcifications now present within the lesion. 8. Admission 07/20/2018 with a recurrent small bowel obstruction, CT negative for evidence of recurrent gastrointestinal stromal  tumor      Disposition: Timothy Hall remains in clinical remission from the gastrointestinal stromal tumor.  He has recurrent admissions with bowel obstruction, likely secondary to adhesions and the area of omental infarction.  We will continue following him with observation.  He will return for an office visit in 6 months. Timothy Hall received an influenza vaccine today.  I encouraged him to discontinue smoking.  Betsy Coder, MD  08/21/2017  8:56 AM

## 2017-08-21 NOTE — Telephone Encounter (Signed)
Gave avs and calendar for august  °

## 2018-01-27 ENCOUNTER — Telehealth: Payer: Self-pay | Admitting: Oncology

## 2018-01-27 NOTE — Telephone Encounter (Signed)
Called patient regarding date change

## 2018-02-19 ENCOUNTER — Ambulatory Visit: Payer: BLUE CROSS/BLUE SHIELD | Admitting: Oncology

## 2018-02-26 ENCOUNTER — Telehealth: Payer: Self-pay

## 2018-02-26 ENCOUNTER — Inpatient Hospital Stay: Payer: BLUE CROSS/BLUE SHIELD | Attending: Oncology | Admitting: Oncology

## 2018-02-26 VITALS — BP 154/82 | HR 75 | Temp 98.5°F | Resp 17 | Ht 72.0 in | Wt 202.6 lb

## 2018-02-26 DIAGNOSIS — Z85028 Personal history of other malignant neoplasm of stomach: Secondary | ICD-10-CM | POA: Insufficient documentation

## 2018-02-26 DIAGNOSIS — C49A2 Gastrointestinal stromal tumor of stomach: Secondary | ICD-10-CM

## 2018-02-26 NOTE — Telephone Encounter (Signed)
Printed avs and calender of upcoming appointment. Per 8/9 los 

## 2018-02-26 NOTE — Progress Notes (Signed)
  [place] OFFICE PROGRESS NOTE   Diagnosis: Gastrointestinal stromal tumor  INTERVAL HISTORY:   Timothy Hall returns for a scheduled visit.  He feels well.  He is working 6 days/week.  No abdominal pain.  Objective:  Vital signs in last 24 hours:  Blood pressure (!) 154/82, pulse 75, temperature 98.5 F (36.9 C), temperature source Oral, resp. rate 17, height 6' (1.829 m), weight 202 lb 9.6 oz (91.9 kg), SpO2 98 %.    HEENT: Neck without mass Lymphatics: No cervical, supraclavicular, axillary, or inguinal nodes Resp: Lungs clear bilaterally Cardio: Regular rate and rhythm GI: No hepatosplenomegaly, nontender, no mass Vascular: No leg edema   Lab Results:  Lab Results  Component Value Date   WBC 13.1 (H) 07/19/2017   HGB 14.2 07/19/2017   HCT 42.5 07/19/2017   MCV 90.6 07/19/2017   PLT 300 07/19/2017   NEUTROABS 6.5 05/18/2017    CMP  Lab Results  Component Value Date   NA 135 07/19/2017   K 4.5 07/19/2017   CL 105 07/19/2017   CO2 24 07/19/2017   GLUCOSE 126 (H) 07/19/2017   BUN 14 07/19/2017   CREATININE 1.05 07/19/2017   CALCIUM 9.1 07/19/2017   PROT 7.1 07/19/2017   ALBUMIN 3.8 07/19/2017   AST 28 07/19/2017   ALT 35 07/19/2017   ALKPHOS 90 07/19/2017   BILITOT 0.5 07/19/2017   GFRNONAA >60 07/19/2017   GFRAA >60 07/19/2017     Medications: I have reviewed the patient's current medications.   Assessment/Plan:  1. Gastrointestinal stromal tumor stage II (T4 NX), most likely arising from the stomach, status post surgical resection including a partial gastrectomy on 07/19/2013, 19 cm cystic mass with a low mitotic rate.  Initiation of adjuvant Gleevec 08/31/2013.   Gleevec dose reduced to 400 mg every other day beginning 10/18/2013 secondary to neutropenia.   Gleevec dose adjusted to 200 mg daily beginning 11/29/2013.   Gleevec dose adjusted to 300 mg daily beginning 12/13/2013.  Upper endoscopy 07/12/2014 without evidence  of recurrent tumor in the stomach 2. Mild skin rash most likely secondary to [place]. 3. History of Mild periorbital edema secondary to [place]. 4. Severe neutropenia secondary to [facility] 10/12/2013. Resolved. 5. History of Mild lower extremity edema likely related to [place]. 6. Admission 07/18/2014 with a small bowel obstruction-resolved with bowel rest, CT 07/18/2014 without evidence of recurrent tumor. Follow-up CT 02/16/2015 with no evidence of gastric region mass or wall thickening. 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 7. Status post evaluation in the emergency department for abdominal pain 05/12/2016. CT with no evidence for acute intra-abdominal or pelvic pathology. Sigmoid colon diverticulosis with no definite surrounding bowel inflammation. Slight decreased size of fat density masslike area within the upper pelvis thought to represent omental infarct; scatteredperipheral calcifications now present within the lesion. 8. Admission 07/20/2018 with a recurrent small bowel obstruction, CT negative for evidence of recurrent gastrointestinal stromal tumor  Disposition: Timothy Hall appears well.  He is in clinical remission from the gastrointestinal stromal tumor.  He will return for an office visit in 8 months.  15 minutes were spent with the patient today.  The majority of the time was used for counseling and coordination of care.  Betsy Coder, MD  02/26/2018  10:16 AM

## 2018-05-21 DIAGNOSIS — H40023 Open angle with borderline findings, high risk, bilateral: Secondary | ICD-10-CM | POA: Diagnosis not present

## 2018-05-21 DIAGNOSIS — H04123 Dry eye syndrome of bilateral lacrimal glands: Secondary | ICD-10-CM | POA: Diagnosis not present

## 2018-05-21 DIAGNOSIS — H2513 Age-related nuclear cataract, bilateral: Secondary | ICD-10-CM | POA: Diagnosis not present

## 2018-08-20 ENCOUNTER — Encounter: Payer: Self-pay | Admitting: Medical

## 2018-08-20 ENCOUNTER — Ambulatory Visit: Payer: BLUE CROSS/BLUE SHIELD | Admitting: Medical

## 2018-08-20 VITALS — BP 140/78 | HR 66 | Temp 97.9°F | Resp 16 | Ht 74.0 in | Wt 204.2 lb

## 2018-08-20 DIAGNOSIS — F17200 Nicotine dependence, unspecified, uncomplicated: Secondary | ICD-10-CM | POA: Diagnosis not present

## 2018-08-20 DIAGNOSIS — G4730 Sleep apnea, unspecified: Secondary | ICD-10-CM | POA: Diagnosis not present

## 2018-08-20 DIAGNOSIS — R0683 Snoring: Secondary | ICD-10-CM | POA: Diagnosis not present

## 2018-08-20 DIAGNOSIS — M542 Cervicalgia: Secondary | ICD-10-CM

## 2018-08-20 DIAGNOSIS — Z8249 Family history of ischemic heart disease and other diseases of the circulatory system: Secondary | ICD-10-CM

## 2018-08-20 DIAGNOSIS — K219 Gastro-esophageal reflux disease without esophagitis: Secondary | ICD-10-CM

## 2018-08-20 MED ORDER — BUPROPION HCL ER (XL) 150 MG PO TB24: 150.0000 mg | ORAL_TABLET | Freq: Every day | ORAL | 2 refills | Status: DC

## 2018-08-20 MED ORDER — PANTOPRAZOLE SODIUM 20 MG PO TBEC: 20.0000 mg | DELAYED_RELEASE_TABLET | Freq: Every day | ORAL | 3 refills | Status: DC

## 2018-08-20 NOTE — Progress Notes (Signed)
Subjective:    Patient ID: Timothy Hall, male    DOB: November 01, 1955, 63 y.o.   MRN: [id number]  HPI  Pt was seeing Dr. Debe Coder.  His former MD is semi- retired.  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 Pt does smoke. Hx of. He quit after cancer surgery for 2 months. Wife describes 2.5 packs a days.   Hx of GI tumor/cancer. Surgery in 2014. Treated by oncologist.   Pt states occasional pain on both sides of his neck. Pain is mild and transient. Pain when has for one hour when has then goes away quickly. 2 weeks ago had pain. But no pain since then.   Pt has abdomen hernia. In area of prior abdomen scar. Pt states specialist recommended no treatment unless became symptomatic.  Hx of 2 bowel obstructions. Slow transit time. Told not to eat foods that are hard to digest.  Has acid reflux. Daily symptoms. prilosec helps some but not much. If misses tabs then symptoms are severe.  Hx of sleep apnea for years. More than 13 years ago had evaluation. Severe episodes when not sleeping.      Review of Systems  Constitutional: Negative for chills, fatigue and fever.  HENT: Negative for congestion, ear pain, hearing loss, postnasal drip and rhinorrhea.   Respiratory: Negative for cough, chest tightness, shortness of breath and wheezing.   Cardiovascular: Negative for chest pain and palpitations.  Gastrointestinal: Negative for abdominal distention, abdominal pain, blood in stool, constipation, nausea and vomiting.  Musculoskeletal: Positive for neck pain. Negative for back pain.       See hpi.  Skin: Negative for rash.  Neurological: Negative for dizziness, tremors, weakness, numbness and headaches.  Hematological: Negative for adenopathy. Does not bruise/bleed easily.  Psychiatric/Behavioral: Negative for behavioral problems, decreased concentration, hallucinations and suicidal ideas. The patient is not nervous/anxious.     Past Medical History:  Diagnosis Date  .  Cancer ([place])   . Complication of anesthesia    slow to awaken  . Gastric neoplasm    Cystic, 18 cm - recently diagnosed October 2014   . GERD (gastroesophageal reflux disease)   . Heart murmur age 27  . History of alcoholism ([place])    Quit drinking in 1985  . Pneumonia age 89   hx of  . Rash    upper back due to gleevac  . Sleep apnea    Intolerant of CPAP     Social History   Socioeconomic History  . Marital status: Married    Spouse name: Not on file  . Number of children: Not on file  . Years of education: Not on file  . Highest education level: Not on file  Occupational History  . Occupation: [employer] engineer: [employer]  . Financial resource strain: Not on file  . Food insecurity:    Worry: Not on file    Inability: Not on file  . Transportation needs:    Medical: Not on file    Non-medical: Not on file  Tobacco Use  . Smoking status: Current Every Day Smoker    Packs/day: 1.00    Years: 40.00    Pack years: 40.00    Types: Cigarettes  . Smokeless tobacco: Never Used  Substance and Sexual Activity  . Alcohol use: No    Comment: quit in 1985  . Drug use: No    Comment: History of alcoholism  . Sexual activity:  Yes  Lifestyle  . Physical activity:    Days per week: Not on file    Minutes per session: Not on file  . Stress: Not on file  Relationships  . Social connections:    Talks on phone: Not on file    Gets together: Not on file    Attends religious service: Not on file    Active member of club or organization: Not on file    Attends meetings of clubs or organizations: Not on file    Relationship status: Not on file  . Intimate partner violence:    Fear of current or ex partner: Not on file    Emotionally abused: Not on file    Physically abused: Not on file    Forced sexual activity: Not on file  Other Topics Concern  . Not on file  Social History Narrative   Married, wife [name]   Has #1 grown daughter  and a grandson   Has a dog he is close to   Enjoys working on cars and fishing    Past Surgical History:  Procedure Laterality Date  . breast tumor removed Right 1976  . COLONOSCOPY N/A 11/20/2014   Procedure: COLONOSCOPY;  Surgeon: [name], MD;  [facility];  Service: Endoscopy;  Laterality: N/A;  1030 - moved to 5/2 @ 10:30  . EUS N/A 05/25/2013   Procedure: ESOPHAGEAL ENDOSCOPIC ULTRASOUND (EUS) RADIAL;  Surgeon: [name], MD;  Location: [facility];  Service: Endoscopy;  Laterality: N/A;  . EUS N/A 07/12/2014   EGD by Dr. [name]: small hh, widely patent Schatzki's ring. evidence of prior small gastric wedge resection along body of stomach. no evidence of recurrent tumor.   [name] FINE NEEDLE ASPIRATION N/A 05/25/2013   Procedure: FINE NEEDLE ASPIRATION (FNA) LINEAR;  Surgeon: [name], MD;  [facility];  Service: Endoscopy;  Laterality: N/A;"this part never happend"  . LAPAROSCOPIC GASTRECTOMY N/A 07/19/2013   Procedure: RESECTION OF RETROGASTRIC CYSTIC NEOPLASM WITH PARTIAL GASTRECTOMY;  Surgeon: [name], MD;  Location: [facility];  Service: General;  Laterality: N/A;  . lipoma removed from back  yrs ago  . TUMOR REMOVAL     Benign tumor removed from left breast and back area.     Family History  Problem Relation Age of Onset  . Breast cancer Mother   . CAD Brother        Premature disease  . Colon cancer Neg Hx   . Stomach cancer Neg Hx     Allergies  Allergen Reactions  . Nicoderm [Nicotine] Rash    Current Outpatient Medications on File Prior to Visit  Medication Sig Dispense Refill  . omeprazole (PRILOSEC OTC) 20 MG tablet Take 20 mg by mouth daily as needed (for acid reflex).     No current facility-administered medications on file prior to visit.     BP (!) 141/75   Pulse 66   Temp 97.9 F (36.6 C) (Oral)   Resp 16   Ht 6\' 2"  (1.88 m)   Wt 204 lb 3.2 oz (92.6 kg)   SpO2 99%   BMI 26.22 kg/m       Objective:    Physical Exam  General  Mental Status - Alert. General Appearance - Well groomed. Not in acute distress.  Skin Rashes- No Rashes.  HEENT Head- Normal. Ear Auditory Canal - Left- Normal. Right - Normal.Tympanic Membrane- Left- Normal. Right- Normal. Eye Sclera/Conjunctiva- Left- Normal. Right- Normal. Nose & Sinuses  Nasal Mucosa- Left-  Boggy and Congested. Right-  Boggy and  Congested.Bilateral no  maxillary and no  frontal sinus pressure. Mouth & Throat Lips: Upper Lip- Normal: no dryness, cracking, pallor, cyanosis, or vesicular eruption. Lower Lip-Normal: no dryness, cracking, pallor, cyanosis or vesicular eruption. Buccal Mucosa- Bilateral- No Aphthous ulcers. Oropharynx- No Discharge or Erythema. Tonsils: Characteristics- Bilateral- No Erythema or Congestion. Size/Enlargement- Bilateral- No enlargement. Discharge- bilateral-None.  Neck Neck- Supple. No Masses. No caroiid bruits heard. No pain on palpation.   Chest and Lung Exam Auscultation: Breath Sounds:-Clear even and unlabored.  Cardiovascular Auscultation:Rythm- Regular, rate and rhythm. Murmurs & Other Heart Sounds:Ausculatation of the heart reveal- No Murmurs.  Lymphatic Head & Neck General Head & Neck Lymphatics: Bilateral: Description- No Localized lymphadenopathy.   Abdomen- soft, nt, nd, +bs. No rebound, or guarding. Umbilical hernia small. Large midline abdomen scar. Small defect felt as well. But no hernia in that area.  Back- no cva tenderenss.      Assessment & Plan:  Nice to meet you today.  For history of snoring and sleep apnea years ago, I did place referral to pulmonologist.  For history of reflux/GERD, I did send in prescription for Protonix.  If able to fill this then use this in place of Prilosec.  For history of smoking, I did prescribe you Wellbutrin.  Rx advisement given on how to start tapering off of cigarettes.  For your history of transient neck pain on the sides your neck near  the carotid regions, I did place ultrasound of carotid order.  Do think this is a good idea in light of your smoking history and a family history.   Follow-up on February 28 for CPE.  Please come in fasting.  Follow-up sooner if needed.  45 minutes spent with pt. 50% of time spent counseling pt on plan going forward. Extensive counseling on smoking and long term effects as well as increased cardiovascular risk. Smoking cessation explained. Also counseled on chronic med problems  Mackie Pai, PA-C

## 2018-08-20 NOTE — Progress Notes (Signed)
e

## 2018-08-20 NOTE — Patient Instructions (Addendum)
Nice to meet you today.  For history of snoring and sleep apnea years ago, I did place referral to pulmonologist.  For history of reflux/GERD, I did send in prescription for Protonix.  If able to fill this then use this in place of Prilosec.  For history of smoking, I did prescribe you Wellbutrin.  Rx advisement given on how to start tapering off of cigarettes.  For your history of transient neck pain on the sides your neck near the carotid regions, I did place ultrasound of carotid order.  Do think this is a good idea in light of your smoking history and a family history.   Follow-up on February 28 for CPE.  Please come in fasting.  Follow-up sooner if needed.

## 2018-08-24 ENCOUNTER — Ambulatory Visit (HOSPITAL_BASED_OUTPATIENT_CLINIC_OR_DEPARTMENT_OTHER): Payer: BLUE CROSS/BLUE SHIELD

## 2018-08-30 ENCOUNTER — Ambulatory Visit (HOSPITAL_BASED_OUTPATIENT_CLINIC_OR_DEPARTMENT_OTHER)
Admission: RE | Admit: 2018-08-30 | Discharge: 2018-08-30 | Disposition: A | Discharge status: Home / Self Care | Payer: BLUE CROSS/BLUE SHIELD | Source: Ambulatory Visit | Attending: Medical | Admitting: Medical

## 2018-08-30 DIAGNOSIS — M542 Cervicalgia: Secondary | ICD-10-CM | POA: Insufficient documentation

## 2018-08-30 DIAGNOSIS — F17200 Nicotine dependence, unspecified, uncomplicated: Secondary | ICD-10-CM

## 2018-08-30 DIAGNOSIS — Z8249 Family history of ischemic heart disease and other diseases of the circulatory system: Secondary | ICD-10-CM | POA: Insufficient documentation

## 2018-08-31 ENCOUNTER — Telehealth: Payer: Self-pay | Admitting: Medical

## 2018-08-31 DIAGNOSIS — E041 Nontoxic single thyroid nodule: Secondary | ICD-10-CM

## 2018-08-31 NOTE — Telephone Encounter (Signed)
Future order thyroid US order placed.

## 2018-09-07 ENCOUNTER — Ambulatory Visit (HOSPITAL_BASED_OUTPATIENT_CLINIC_OR_DEPARTMENT_OTHER)
Admission: RE | Admit: 2018-09-07 | Discharge: 2018-09-07 | Disposition: A | Discharge status: Home / Self Care | Payer: BLUE CROSS/BLUE SHIELD | Source: Ambulatory Visit | Attending: Medical | Admitting: Medical

## 2018-09-07 DIAGNOSIS — E041 Nontoxic single thyroid nodule: Secondary | ICD-10-CM | POA: Insufficient documentation

## 2018-09-07 DIAGNOSIS — E042 Nontoxic multinodular goiter: Secondary | ICD-10-CM | POA: Diagnosis not present

## 2018-09-08 ENCOUNTER — Telehealth: Payer: Self-pay | Admitting: Medical

## 2018-09-08 DIAGNOSIS — E041 Nontoxic single thyroid nodule: Secondary | ICD-10-CM

## 2018-09-08 NOTE — Telephone Encounter (Signed)
Referral to ENT placed

## 2018-09-08 NOTE — Telephone Encounter (Signed)
Opened to review 

## 2018-09-17 ENCOUNTER — Ambulatory Visit (INDEPENDENT_AMBULATORY_CARE_PROVIDER_SITE_OTHER): Payer: BLUE CROSS/BLUE SHIELD | Admitting: Medical

## 2018-09-17 ENCOUNTER — Ambulatory Visit (INDEPENDENT_AMBULATORY_CARE_PROVIDER_SITE_OTHER): Payer: BLUE CROSS/BLUE SHIELD | Admitting: Pulmonary Disease

## 2018-09-17 ENCOUNTER — Encounter: Payer: Self-pay | Admitting: Pulmonary Disease

## 2018-09-17 ENCOUNTER — Encounter: Payer: Self-pay | Admitting: Medical

## 2018-09-17 VITALS — BP 156/75 | HR 70 | Temp 98.0°F | Resp 16 | Ht 74.0 in | Wt 204.2 lb

## 2018-09-17 DIAGNOSIS — G4733 Obstructive sleep apnea (adult) (pediatric): Secondary | ICD-10-CM

## 2018-09-17 DIAGNOSIS — R5383 Other fatigue: Secondary | ICD-10-CM | POA: Diagnosis not present

## 2018-09-17 DIAGNOSIS — Z125 Encounter for screening for malignant neoplasm of prostate: Secondary | ICD-10-CM | POA: Diagnosis not present

## 2018-09-17 DIAGNOSIS — I10 Essential (primary) hypertension: Secondary | ICD-10-CM | POA: Diagnosis not present

## 2018-09-17 DIAGNOSIS — F17210 Nicotine dependence, cigarettes, uncomplicated: Secondary | ICD-10-CM | POA: Diagnosis not present

## 2018-09-17 DIAGNOSIS — Z113 Encounter for screening for infections with a predominantly sexual mode of transmission: Secondary | ICD-10-CM | POA: Diagnosis not present

## 2018-09-17 DIAGNOSIS — Z1283 Encounter for screening for malignant neoplasm of skin: Secondary | ICD-10-CM

## 2018-09-17 DIAGNOSIS — H40023 Open angle with borderline findings, high risk, bilateral: Secondary | ICD-10-CM | POA: Diagnosis not present

## 2018-09-17 DIAGNOSIS — E041 Nontoxic single thyroid nodule: Secondary | ICD-10-CM

## 2018-09-17 DIAGNOSIS — Z0000 Encounter for general adult medical examination without abnormal findings: Secondary | ICD-10-CM

## 2018-09-17 DIAGNOSIS — H2513 Age-related nuclear cataract, bilateral: Secondary | ICD-10-CM | POA: Diagnosis not present

## 2018-09-17 DIAGNOSIS — H04123 Dry eye syndrome of bilateral lacrimal glands: Secondary | ICD-10-CM | POA: Diagnosis not present

## 2018-09-17 DIAGNOSIS — Z23 Encounter for immunization: Secondary | ICD-10-CM | POA: Diagnosis not present

## 2018-09-17 LAB — CBC WITH DIFFERENTIAL/PLATELET
Basophils Absolute: 0.1 10*3/uL (ref 0.0–0.1)
Basophils Relative: 0.7 % (ref 0.0–3.0)
Eosinophils Absolute: 0.2 10*3/uL (ref 0.0–0.7)
Eosinophils Relative: 1.9 % (ref 0.0–5.0)
HCT: 45.7 % (ref 39.0–52.0)
Hemoglobin: 15.2 g/dL (ref 13.0–17.0)
Lymphocytes Relative: 32.1 % (ref 12.0–46.0)
Lymphs Abs: 2.9 10*3/uL (ref 0.7–4.0)
MCHC: 33.3 g/dL (ref 30.0–36.0)
MCV: 91.6 fl (ref 78.0–100.0)
MONO ABS: 0.7 10*3/uL (ref 0.1–1.0)
Monocytes Relative: 7.8 % (ref 3.0–12.0)
NEUTROS PCT: 57.5 % (ref 43.0–77.0)
Neutro Abs: 5.2 10*3/uL (ref 1.4–7.7)
Platelets: 295 10*3/uL (ref 150.0–400.0)
RBC: 4.99 Mil/uL (ref 4.22–5.81)
RDW: 14 % (ref 11.5–15.5)
WBC: 9.1 10*3/uL (ref 4.0–10.5)

## 2018-09-17 LAB — COMPREHENSIVE METABOLIC PANEL
ALT: 34 U/L (ref 0–53)
AST: 20 U/L (ref 0–37)
Albumin: 4.4 g/dL (ref 3.5–5.2)
Alkaline Phosphatase: 97 U/L (ref 39–117)
BUN: 19 mg/dL (ref 6–23)
CO2: 23 mEq/L (ref 19–32)
Calcium: 9.5 mg/dL (ref 8.4–10.5)
Chloride: 107 mEq/L (ref 96–112)
Creatinine, Ser: 1.12 mg/dL (ref 0.40–1.50)
GFR: 66.43 mL/min (ref >60.00)
GLUCOSE: 97 mg/dL (ref 70–99)
Potassium: 4.7 mEq/L (ref 3.5–5.1)
Sodium: 140 mEq/L (ref 135–145)
Total Bilirubin: 0.4 mg/dL (ref 0.2–1.2)
Total Protein: 6.9 g/dL (ref 6.0–8.3)

## 2018-09-17 LAB — LIPID PANEL
CHOL/HDL RATIO: 5
Cholesterol: 193 mg/dL (ref 0–200)
HDL: 38.2 mg/dL — ABNORMAL LOW (ref >39.00)
LDL CALC: 136 mg/dL — AB (ref 0–99)
NONHDL: 155.24
Triglycerides: 96 mg/dL (ref 0.0–149.0)
VLDL: 19.2 mg/dL (ref 0.0–40.0)

## 2018-09-17 LAB — PSA: PSA: 1.87 ng/mL (ref 0.10–4.00)

## 2018-09-17 LAB — TSH: TSH: 1.26 u[IU]/mL (ref 0.35–4.50)

## 2018-09-17 LAB — VITAMIN B12: Vitamin B-12: 289 pg/mL (ref 211–911)

## 2018-09-17 LAB — T4, FREE: Free T4: 0.86 ng/dL (ref 0.60–1.60)

## 2018-09-17 MED ORDER — AMLODIPINE BESYLATE 5 MG PO TABS: 5.0000 mg | ORAL_TABLET | Freq: Every day | ORAL | 3 refills | Status: DC

## 2018-09-17 MED ORDER — PANTOPRAZOLE SODIUM 20 MG PO TBEC: 20.0000 mg | DELAYED_RELEASE_TABLET | Freq: Two times a day (BID) | ORAL | 0 refills | Status: DC

## 2018-09-17 NOTE — Patient Instructions (Signed)
Schedule home sleep test. Based on this we will get you new CPAP supplies including an air fit F 30 fullface mask.  You likely need a CPAP titration study for desensitization and to get you comfortable on the machine  Call me back and let me know if you have an auto machine

## 2018-09-17 NOTE — Assessment & Plan Note (Signed)
Schedule home sleep test. Based on this we will get you new CPAP supplies including an air fit F 30 fullface mask.  You likely need a CPAP titration study for desensitization and to get you comfortable on the machine  Call me back and let me know if you have an auto machine  Weight loss encouraged, compliance with goal of at least 4-6 hrs every night is the expectation. Advised against medications with sedative side effects Cautioned against driving when sleepy - understanding that sleepiness will vary on a day to day basis

## 2018-09-17 NOTE — Progress Notes (Signed)
Subjective:    Patient ID: Timothy Hall, male    DOB: July 09, 1956, 63 y.o.   MRN: [id number]  HPI  Pt in for wellness exam.  Pt is fasting.   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 Pt does smoke. Hx of. He quit after cancer surgery for 2 months. Wife describes 2.5 packs a days.  Pt is seeing sleep specialist today for snoring.  Will get tdap today.    Review of Systems  Constitutional: Positive for fatigue. Negative for chills and fever.       Daily.  HENT: Negative for congestion, ear discharge, ear pain, facial swelling and hearing loss.   Respiratory: Negative for chest tightness, shortness of breath and wheezing.   Cardiovascular: Negative for chest pain and palpitations.  Gastrointestinal: Positive for abdominal pain. Negative for diarrhea and rectal pain.       Gerd symptoms sometimes is not enough with just one tab a day. States may needs twice a day.  Musculoskeletal: Negative for back pain, joint swelling and neck stiffness.  Skin: Negative for rash.  Neurological: Negative for dizziness, tremors, weakness, numbness and headaches.  Hematological: Negative for adenopathy. Does not bruise/bleed easily.  Psychiatric/Behavioral: Negative for behavioral problems and confusion. The patient is not nervous/anxious.     Past Medical History:  Diagnosis Date  . Cancer ([place])   . Complication of anesthesia    slow to awaken  . Gastric neoplasm    Cystic, 18 cm - recently diagnosed October 2014   . GERD (gastroesophageal reflux disease)   . Heart murmur age 29  . History of alcoholism ([place])    Quit drinking in 1985  . Pneumonia age 29   hx of  . Rash    upper back due to gleevac  . Sleep apnea    Intolerant of CPAP     Social History   Socioeconomic History  . Marital status: Married    Spouse name: Not on file  . Number of children: Not on file  . Years of education: Not on file  . Highest education level: Not on file  Occupational History    . Occupation: [employer] engineer: [employer]  . Financial resource strain: Not on file  . Food insecurity:    Worry: Not on file    Inability: Not on file  . Transportation needs:    Medical: Not on file    Non-medical: Not on file  Tobacco Use  . Smoking status: Current Every Day Smoker    Packs/day: 2.00    Years: 40.00    Pack years: 80.00    Types: Cigarettes  . Smokeless tobacco: Never Used  Substance and Sexual Activity  . Alcohol use: No    Comment: quit in 1985  . Drug use: No    Comment: History of alcoholism  . Sexual activity: Yes  Lifestyle  . Physical activity:    Days per week: Not on file    Minutes per session: Not on file  . Stress: Not on file  Relationships  . Social connections:    Talks on phone: Not on file    Gets together: Not on file    Attends religious service: Not on file    Active member of club or organization: Not on file    Attends meetings of clubs or organizations: Not on file    Relationship status: Not on file  . Intimate partner  violence:    Fear of current or ex partner: Not on file    Emotionally abused: Not on file    Physically abused: Not on file    Forced sexual activity: Not on file  Other Topics Concern  . Not on file  Social History Narrative   Married, wife Timothy Hall   Has #1 grown daughter and a grandson   Has a dog he is close to   Enjoys working on cars and fishing    Past Surgical History:  Procedure Laterality Date  . breast tumor removed Right 1976  . COLONOSCOPY N/A 11/20/2014   Procedure: COLONOSCOPY;  Surgeon: [name], MD;  [facility];  Service: Endoscopy;  Laterality: N/A;  1030 - moved to 5/2 @ 10:30  . EUS N/A 05/25/2013   Procedure: ESOPHAGEAL ENDOSCOPIC ULTRASOUND (EUS) RADIAL;  Surgeon: [name], MD;  [facility];  Service: Endoscopy;  Laterality: N/A;  . EUS N/A 07/12/2014   EGD by Dr. [name]: small hh, widely patent Schatzki's  ring. evidence of prior small gastric wedge resection along body of stomach. no evidence of recurrent tumor.   [name] FINE NEEDLE ASPIRATION N/A 05/25/2013   Procedure: FINE NEEDLE ASPIRATION (FNA) LINEAR;  Surgeon: [name], MD;  [facility];  Service: Endoscopy;  Laterality: N/A;"this part never happend"  . LAPAROSCOPIC GASTRECTOMY N/A 07/19/2013   Procedure: RESECTION OF RETROGASTRIC CYSTIC NEOPLASM WITH PARTIAL GASTRECTOMY;  Surgeon: [name], MD;  [facility];  Service: General;  Laterality: N/A;  . lipoma removed from back  yrs ago  . TUMOR REMOVAL     Benign tumor removed from left breast and back area.     Family History  Problem Relation Age of Onset  . Breast cancer Mother   . CAD Brother        Premature disease  . Colon cancer Neg Hx   . Stomach cancer Neg Hx     Allergies  Allergen Reactions  . Nicoderm [Nicotine] Rash    Current Outpatient Medications on File Prior to Visit  Medication Sig Dispense Refill  . buPROPion (WELLBUTRIN XL) 150 MG 24 hr tablet Take 1 tablet (150 mg total) by mouth daily. 30 tablet 2  . omeprazole (PRILOSEC OTC) 20 MG tablet Take 20 mg by mouth daily as needed (for acid reflex).    . pantoprazole (PROTONIX) 20 MG tablet Take 1 tablet (20 mg total) by mouth daily. 30 tablet 3   No current facility-administered medications on file prior to visit.     BP (!) 156/75   Pulse 70   Temp 98 F (36.7 C) (Oral)   Resp 16   Ht 6\' 2"  (1.88 m)   Wt 204 lb 3.2 oz (92.6 kg)   SpO2 100%   BMI 26.22 kg/m       Objective:   Physical Exam  General Mental Status- Alert. General Appearance- Not in acute distress.   Skin Scattered moles on back. Some moderate  Borderline sized, dark and slight irregular.  Neck Carotid Arteries- Normal color. Moisture- Normal Moisture. No carotid bruits. No JVD.  Chest and Lung Exam Auscultation: Breath Sounds:-Normal.  Cardiovascular Auscultation:Rythm- Regular. Murmurs & Other  Heart Sounds:Auscultation of the heart reveals- No Murmurs.  Abdomen Inspection:-Inspeection Normal. Palpation/Percussion:Note:No mass. Palpation and Percussion of the abdomen reveal- Non Tender, Non Distended + BS, no rebound or guarding.   Neurologic Cranial Nerve exam:- CN III-XII intact(No nystagmus), symmetric smile. Drift Test:- No drift. Romberg Exam:- Negative.  Heal to Toe Gait exam:-Normal. Finger to Nose:- Normal/Intact Strength:- 5/5 equal and symmetric strength both upper and lower extremities.      Assessment & Plan:  For you wellness exam today I have ordered cbc, cmp, tsh t4, psa  lipid panel, and hiv  Vaccine given today tdap.  Recommend exercise and healthy diet.  We will let you know lab results as they come in.  Follow up date appointment will be determined after lab review.   For htn I did rx amlodipine.(discussed benefit and goal 130/80. Reduce cardiovascular risk)  For fatigue adding b12, b1, vit d.  Did refer to derm for screening exam.  [phone] visit added as did treat and counsel on htn. Educated on importance on control to reduce risk of stroke and MI. Also explained will be giving cardiovascular risk score on labs once lipid panel review.  In addition fatigue lab work up being done.

## 2018-09-17 NOTE — Patient Instructions (Signed)
For you wellness exam today I have ordered cbc, cmp, tsh t4, psa  lipid panel, and hiv  Vaccine given today tdap.  Recommend exercise and healthy diet.  We will let you know lab results as they come in.  Follow up date appointment will be determined after lab review.   For htn I did rx amlodipine.(discussed benefit and goal 130/80. Reduce cardiovascular risk)  For fatigue adding b12, b1, vit d.  Did refer to derm for screening exam.   Preventive Care 55-64 Years, Male Preventive care refers to lifestyle choices and visits with your health care provider that can promote health and wellness. What does preventive care include?   A yearly physical exam. This is also called an annual well check.  Dental exams once or twice a year.  Routine eye exams. Ask your health care provider how often you should have your eyes checked.  Personal lifestyle choices, including: ? Daily care of your teeth and gums. ? Regular physical activity. ? Eating a healthy diet. ? Avoiding tobacco and drug use. ? Limiting alcohol use. ? Practicing safe sex. ? Taking low-dose aspirin every day starting at age 62. What happens during an annual well check? The services and screenings done by your health care provider during your annual well check will depend on your age, overall health, lifestyle risk factors, and family history of disease. Counseling Your health care provider may ask you questions about your:  Alcohol use.  Tobacco use.  Drug use.  Emotional well-being.  Home and relationship well-being.  Sexual activity.  Eating habits.  Work and work Statistician. Screening You may have the following tests or measurements:  Height, weight, and BMI.  Blood pressure.  Lipid and cholesterol levels. These may be checked every 5 years, or more frequently if you are over 64 years old.  Skin check.  Lung cancer screening. You may have this screening every year starting at age 62 if you  have a 30-pack-year history of smoking and currently smoke or have quit within the past 15 years.  Colorectal cancer screening. All adults should have this screening starting at age 62 and continuing until age 49. Your health care provider may recommend screening at age 35. You will have tests every 1-10 years, depending on your results and the type of screening test. People at increased risk should start screening at an earlier age. Screening tests may include: ? Guaiac-based fecal occult blood testing. ? Fecal immunochemical test (FIT). ? Stool DNA test. ? Virtual colonoscopy. ? Sigmoidoscopy. During this test, a flexible tube with a tiny camera (sigmoidoscope) is used to examine your rectum and lower colon. The sigmoidoscope is inserted through your anus into your rectum and lower colon. ? Colonoscopy. During this test, a long, thin, flexible tube with a tiny camera (colonoscope) is used to examine your entire colon and rectum.  Prostate cancer screening. Recommendations will vary depending on your family history and other risks.  Hepatitis C blood test.  Hepatitis B blood test.  Sexually transmitted disease (STD) testing.  Diabetes screening. This is done by checking your blood sugar (glucose) after you have not eaten for a while (fasting). You may have this done every 1-3 years. Discuss your test results, treatment options, and if necessary, the need for more tests with your health care provider. Vaccines Your health care provider may recommend certain vaccines, such as:  Influenza vaccine. This is recommended every year.  Tetanus, diphtheria, and acellular pertussis (Tdap, Td) vaccine. You may need  a Td booster every 10 years.  Varicella vaccine. You may need this if you have not been vaccinated.  Zoster vaccine. You may need this after age 62.  Measles, mumps, and rubella (MMR) vaccine. You may need at least one dose of MMR if you were born in 1957 or later. You may also need a  second dose.  Pneumococcal 13-valent conjugate (PCV13) vaccine. You may need this if you have certain conditions and have not been vaccinated.  Pneumococcal polysaccharide (PPSV23) vaccine. You may need one or two doses if you smoke cigarettes or if you have certain conditions.  Meningococcal vaccine. You may need this if you have certain conditions.  Hepatitis A vaccine. You may need this if you have certain conditions or if you travel or work in places where you may be exposed to hepatitis A.  Hepatitis B vaccine. You may need this if you have certain conditions or if you travel or work in places where you may be exposed to hepatitis B.  Haemophilus influenzae type b (Hib) vaccine. You may need this if you have certain risk factors. Talk to your health care provider about which screenings and vaccines you need and how often you need them. This information is not intended to replace advice given to you by your health care provider. Make sure you discuss any questions you have with your health care provider. Document Released: 08/03/2015 Document Revised: 08/27/2017 Document Reviewed: 05/08/2015 Elsevier Interactive Patient Education  2019 [employer].

## 2018-09-17 NOTE — Assessment & Plan Note (Signed)
Wellbutrin has helped him decrease smoking. We discussed smoking cessation.  Will consider screening for lung cancer in the future

## 2018-09-17 NOTE — Progress Notes (Signed)
Subjective:    Patient ID: Timothy Hall, male    DOB: 1955-09-28, 63 y.o.   MRN: [id number]  HPI  63 year old smoker presents for evaluation management of obstructive sleep apnea. He was diagnosed with OSA years ago after a sleep study done at [facility].  He was given CPAP with a full facemask however he felt like this was smothering him, he tried 3 different masks, did not like the full facemask but on his initial machine, it seems there was a ramp before the machine would increase and then he would wake up at night with the pressure too high.  When there was a recall on this machine, he was given a replacement but the pressure was set too high and he could not tolerate at all.  He still has the machine but he has not really used it in several years. Epworth sleepiness score is 6. His wife has noted loud snoring and witnessed apneas. Bedtime is about 8 PM, sleep latency is minimal, he sleeps on his left side with one pillow has increased GERD symptoms when he lies on his right, reports 1-2 nocturnal awakenings including nocturia and is out of bed by 5 AM feeling tired with dryness of mouth denies headaches. His weight has decreased by 10 pounds over the last 5 years.  There is no history suggestive of cataplexy, sleep paralysis or parasomnias He denies excessive use of caffeinated beverages.  He smokes about a pack per day which is much better than 2 packs/day that he was smoking, more than 50 pack years.  He is started on Wellbutrin with the idea of decreasing smoking.  He quit drinking in 1985.  Hypertension is controlled with known medication. He has a history of partial gastrectomy in 06/2013 for GI ST tumor  Past Medical History:  Diagnosis Date  . Cancer ([place])   . Complication of anesthesia    slow to awaken  . Gastric neoplasm    Cystic, 18 cm - recently diagnosed October 2014   . GERD (gastroesophageal reflux disease)   . Heart murmur age 40  . History of alcoholism ([place])    Quit  drinking in 1985  . Pneumonia age 16   hx of  . Rash    upper back due to gleevac  . Sleep apnea    Intolerant of CPAP    Past Surgical History:  Procedure Laterality Date  . breast tumor removed Right 1976  . COLONOSCOPY N/A 11/20/2014   Procedure: COLONOSCOPY;  Surgeon: [name], MD;  [facility]: [facility];  Service: Endoscopy;  Laterality: N/A;  1030 - moved to 5/2 @ 10:30  . EUS N/A 05/25/2013   Procedure: ESOPHAGEAL ENDOSCOPIC ULTRASOUND (EUS) RADIAL;  Surgeon: [name], MD;  Location: [facility];  Service: Endoscopy;  Laterality: N/A;  . EUS N/A 07/12/2014   EGD by Dr. [name]: small hh, widely patent Schatzki's ring. evidence of prior small gastric wedge resection along body of stomach. no evidence of recurrent tumor.   [name] FINE NEEDLE ASPIRATION N/A 05/25/2013   Procedure: FINE NEEDLE ASPIRATION (FNA) LINEAR;  Surgeon: [name], MD;  Location: [facility];  Service: Endoscopy;  Laterality: N/A;"this part never happend"  . LAPAROSCOPIC GASTRECTOMY N/A 07/19/2013   Procedure: RESECTION OF RETROGASTRIC CYSTIC NEOPLASM WITH PARTIAL GASTRECTOMY;  Surgeon: [name], MD;  Location: [facility];  Service: General;  Laterality: N/A;  . lipoma removed from back  yrs ago  . TUMOR REMOVAL     Benign  tumor removed from left breast and back area.     Allergies  Allergen Reactions  . Nicoderm [Nicotine] Rash    Social History   Socioeconomic History  . Marital status: Married    Spouse name: Not on file  . Number of children: Not on file  . Years of education: Not on file  . Highest education level: Not on file  Occupational History  . 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 Financial resource strain: Not on file  . Food insecurity:    Worry: Not on file    Inability: Not on file  . Transportation needs:    Medical: Not on file    Non-medical: Not on file  Tobacco Use  . Smoking status: Current Every Day Smoker     Packs/day: 2.00    Years: 40.00    Pack years: 80.00    Types: Cigarettes  . Smokeless tobacco: Never Used  Substance and Sexual Activity  . Alcohol use: No    Comment: quit in 1985  . Drug use: No    Comment: History of alcoholism  . Sexual activity: Yes  Lifestyle  . Physical activity:    Days per week: Not on file    Minutes per session: Not on file  . Stress: Not on file  Relationships  . Social connections:    Talks on phone: Not on file    Gets together: Not on file    Attends religious service: Not on file    Active member of club or organization: Not on file    Attends meetings of clubs or organizations: Not on file    Relationship status: Not on file  . Intimate partner violence:    Fear of current or ex partner: Not on file    Emotionally abused: Not on file    Physically abused: Not on file    Forced sexual activity: Not on file  Other Topics Concern  . Not on file  Social History Narrative   Married, wife [name]   Has #1 grown daughter and a grandson   Has a dog he is close to   Enjoys working on cars and fishing    Family History  Problem Relation Age of Onset  . Breast cancer Mother   . CAD Brother        Premature disease  . Colon cancer Neg Hx   . Stomach cancer Neg Hx        Review of Systems Positive for acid heartburn Hard of hearing  Constitutional: negative for anorexia, fevers and sweats  Eyes: negative for irritation, redness and visual disturbance  Ears, nose, mouth, throat, and face: negative for earaches, epistaxis, nasal congestion and sore throat  Respiratory: negative for cough, dyspnea on exertion, sputum and wheezing  Cardiovascular: negative for chest pain, dyspnea, lower extremity edema, orthopnea, palpitations and syncope  Gastrointestinal: negative for abdominal pain, constipation, diarrhea, melena, nausea and vomiting  Genitourinary:negative for dysuria, frequency and hematuria  Hematologic/lymphatic: negative for  bleeding, easy bruising and lymphadenopathy  Musculoskeletal:negative for arthralgias, muscle weakness and stiff joints  Neurological: negative for coordination problems, gait problems, headaches and weakness  Endocrine: negative for diabetic symptoms including polydipsia, polyuria and weight loss     Objective:   Physical Exam  Gen. Pleasant, well-nourished, in no distress, normal affect ENT - no pallor,icterus, no post nasal drip, hard of hearing Neck: No JVD, no thyromegaly, no carotid bruits Lungs: no  use of accessory muscles, no dullness to percussion, clear without rales or rhonchi  Cardiovascular: Rhythm regular, heart sounds  normal, no murmurs or gallops, no peripheral edema Abdomen: soft and non-tender, no hepatosplenomegaly, BS normal. Musculoskeletal: No deformities, no cyanosis or clubbing Neuro:  alert, non focal       Assessment & Plan:

## 2018-09-18 ENCOUNTER — Telehealth: Payer: Self-pay | Admitting: Medical

## 2018-09-18 MED ORDER — ATORVASTATIN CALCIUM 10 MG PO TABS: 10.0000 mg | ORAL_TABLET | Freq: Every day | ORAL | 3 refills | Status: DC

## 2018-09-18 NOTE — Telephone Encounter (Signed)
Rx atorvastatin sent to pt pharmacy. 

## 2018-09-23 LAB — VITAMIN D 1,25 DIHYDROXY
Vitamin D 1, 25 (OH)2 Total: 40 pg/mL (ref 18–72)
Vitamin D2 1, 25 (OH)2: <8 pg/mL
Vitamin D3 1, 25 (OH)2: 40 pg/mL

## 2018-09-23 LAB — VITAMIN B1: Vitamin B1 (Thiamine): 8 nmol/L (ref 8–30)

## 2018-09-23 IMAGING — CT CT ABD-PELV W/ CM
2 of 5 series · 16 of 46 positions shown, 18 images · IV contrast (ISOVUE)
Comparison: CT scans dated 05/12/2016, 02/16/2015, 07/18/2014 and
05/13/2013

CLINICAL DATA: Abdominal pain for 3 weeks.

EXAM:
CT ABDOMEN AND PELVIS WITH CONTRAST
TECHNIQUE: Multidetector CT imaging of the abdomen and pelvis was performed
using the standard protocol following bolus administration of
intravenous contrast.
CONTRAST:  100 cc Isovue 300

[Series 2: abd/pel with · axial · 0.76mm/px · z∈[-477,-57]mm · 13 of 99 slices shown, 15 images]
[im 8/99  soft-tissue]
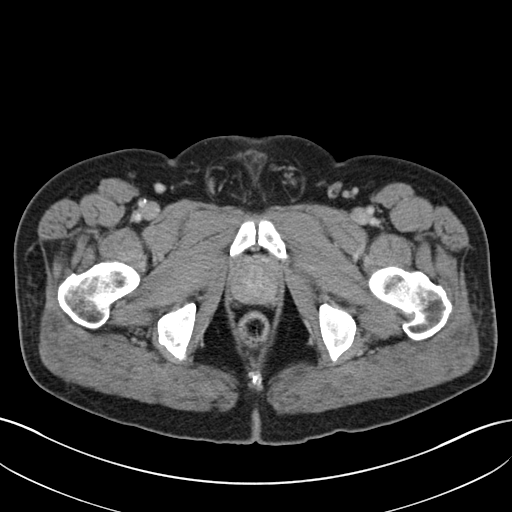
[im 8/99  bone]
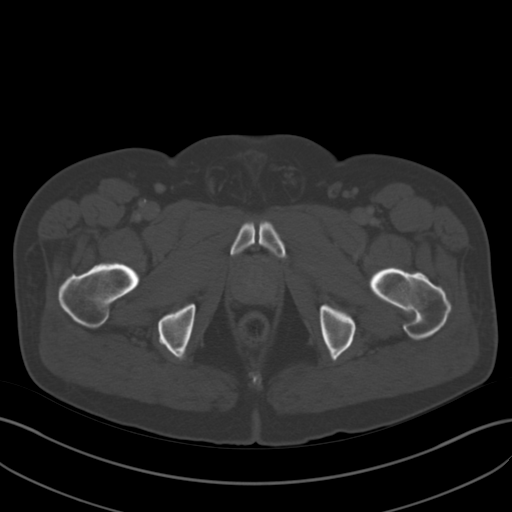
[im 15/99  soft-tissue]
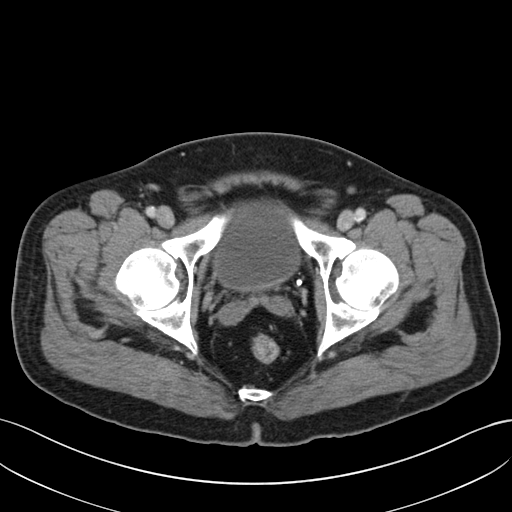
[im 22/99  soft-tissue]
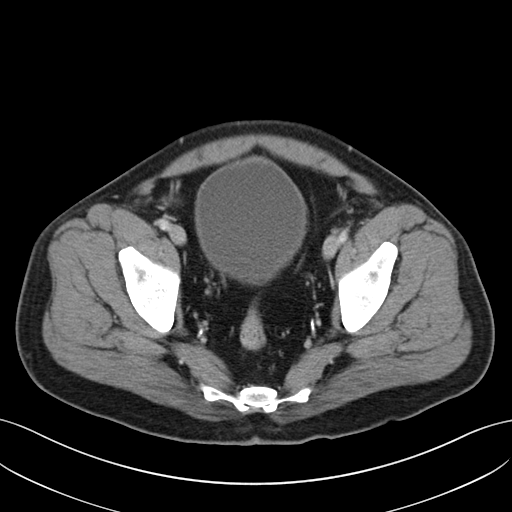
[im 29/99  soft-tissue]
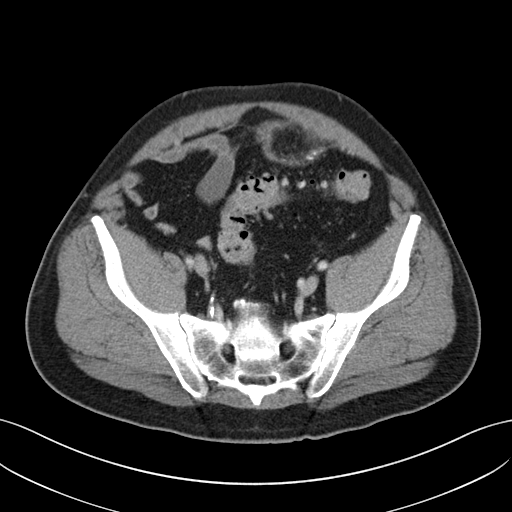
[im 36/99  soft-tissue]
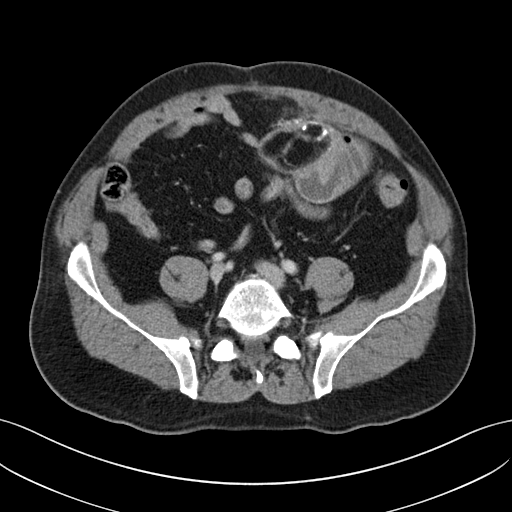
[im 43/99  soft-tissue]
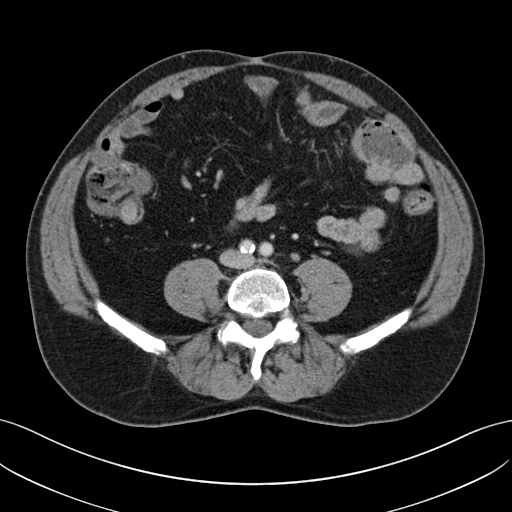
[im 50/99  soft-tissue]
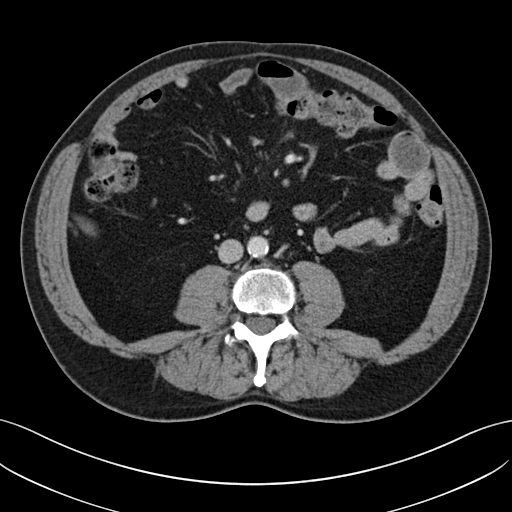
[im 57/99  soft-tissue]
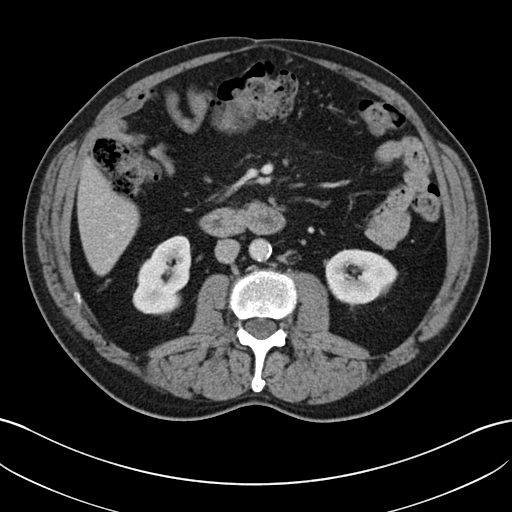
[im 64/99  soft-tissue]
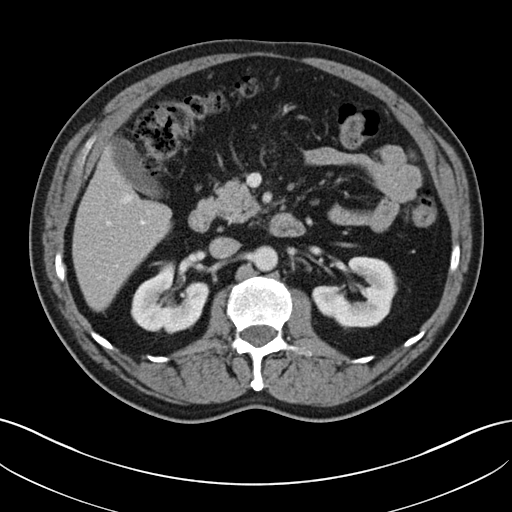
[im 64/99  bone]
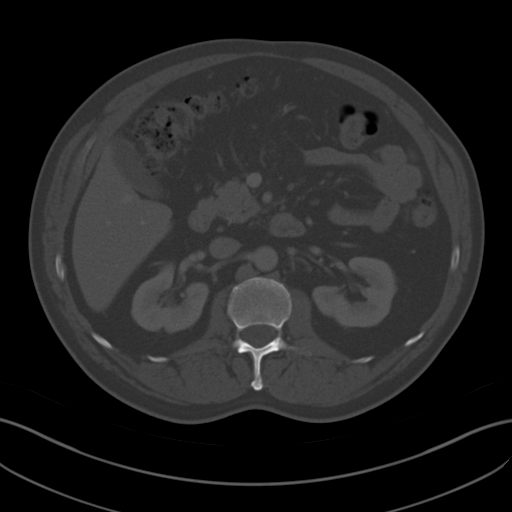
[im 71/99  soft-tissue]
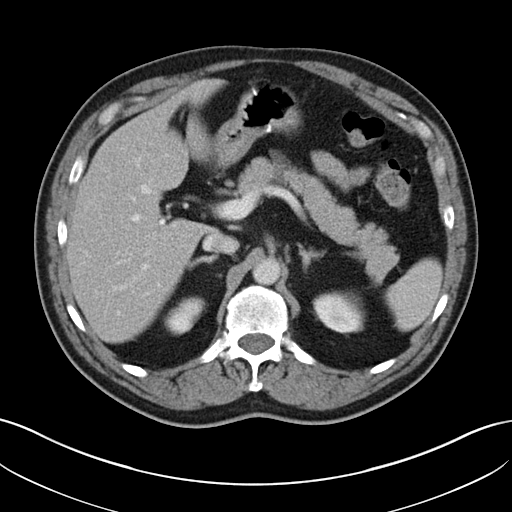
[im 78/99  soft-tissue]
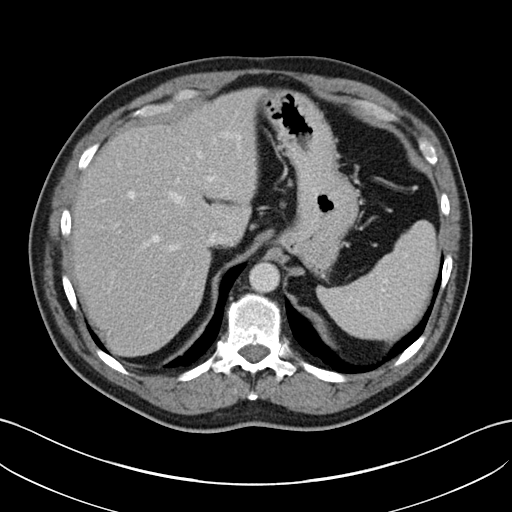
[im 85/99  soft-tissue]
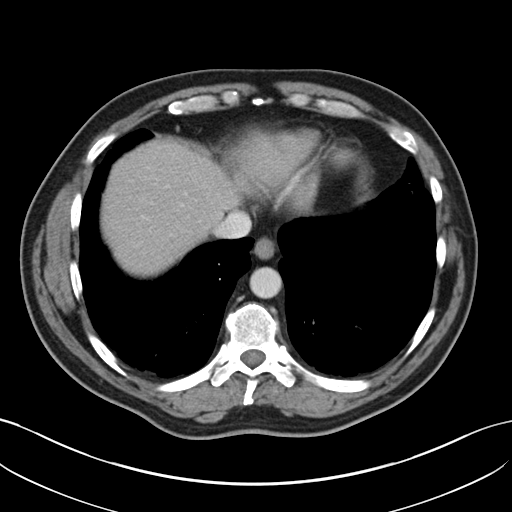
[im 92/99  soft-tissue]
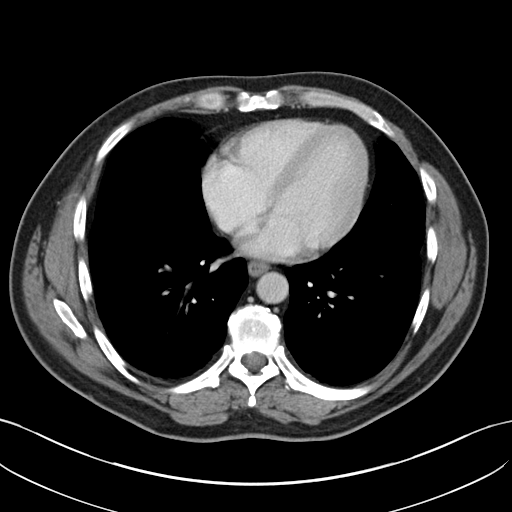

[Series 5: coronal a/|p · coronal · 0.76mm/px · 3 of 164 slices shown]
[im 55/164  soft-tissue]
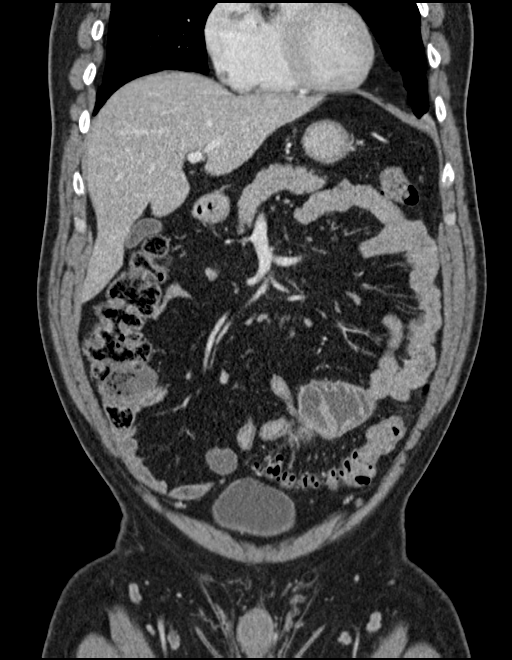
[im 73/164  soft-tissue]
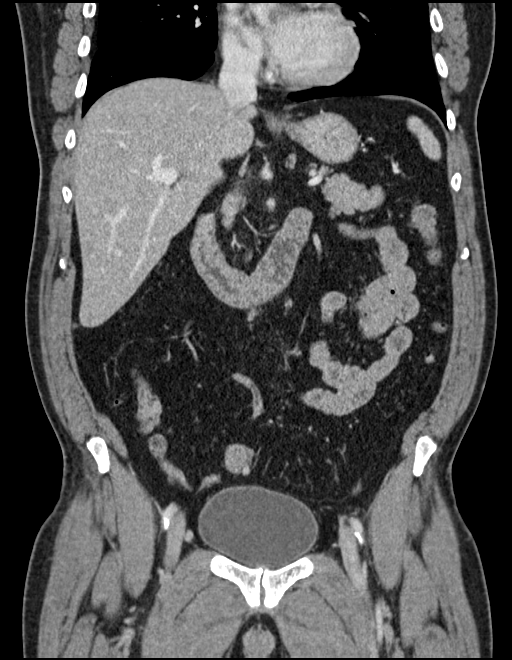
[im 91/164  soft-tissue]
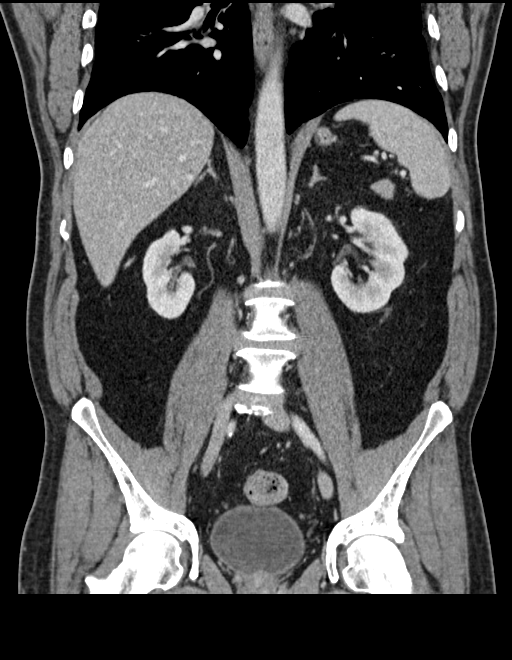

[16 of 46 positions shown; findings below may reference images not displayed]

FINDINGS: Lower chest: Minimal atelectasis at the right base posteriorly.

Hepatobiliary: No significant abnormality. Biliary tree is normal. 2
tiny stable cysts in the right lobe of the liver.

Pancreas: Unremarkable. No pancreatic ductal dilatation or
surrounding inflammatory changes.

Spleen: Single chronic calcified granuloma in the otherwise normal
spleen.

Adrenals/Urinary Tract: Benign-appearing 8 mm cyst on the lateral
aspect of the upper pole of the left kidney. Kidneys are otherwise
normal. Adrenal glands and bladder appear normal.

Stomach/Bowel: There is focal dilatation of a single small bowel
loop in the left mid abdomen adjacent to an area of probable old
omental infarct with associated calcification and soft tissue
stranding.

Multiple diverticula in the distal colon without diverticulitis.
Surgical staples in the body of the stomach. The bowel otherwise
appears normal.

Vascular/Lymphatic: Aortic atherosclerosis. No enlarged abdominal or
pelvic lymph nodes.

Reproductive: Prostate is unremarkable.

Other: No abdominal wall hernia or abnormality. No abdominopelvic
ascites.

Musculoskeletal: No acute or significant osseous findings.
IMPRESSION: Findings consistent with focal partial small-bowel obstruction in
the left mid abdomen adjacent to an area of previous omental
infarction.

## 2018-09-23 IMAGING — CR DG ABDOMEN 1V
2 series · 2 of 2 positions shown · non-contrast
Comparison: CT 05/18/2017

CLINICAL DATA: Partial small bowel obstruction, abdominal pain

EXAM:
ABDOMEN - 1 VIEW

[t abdomen supine (1 of 2)]
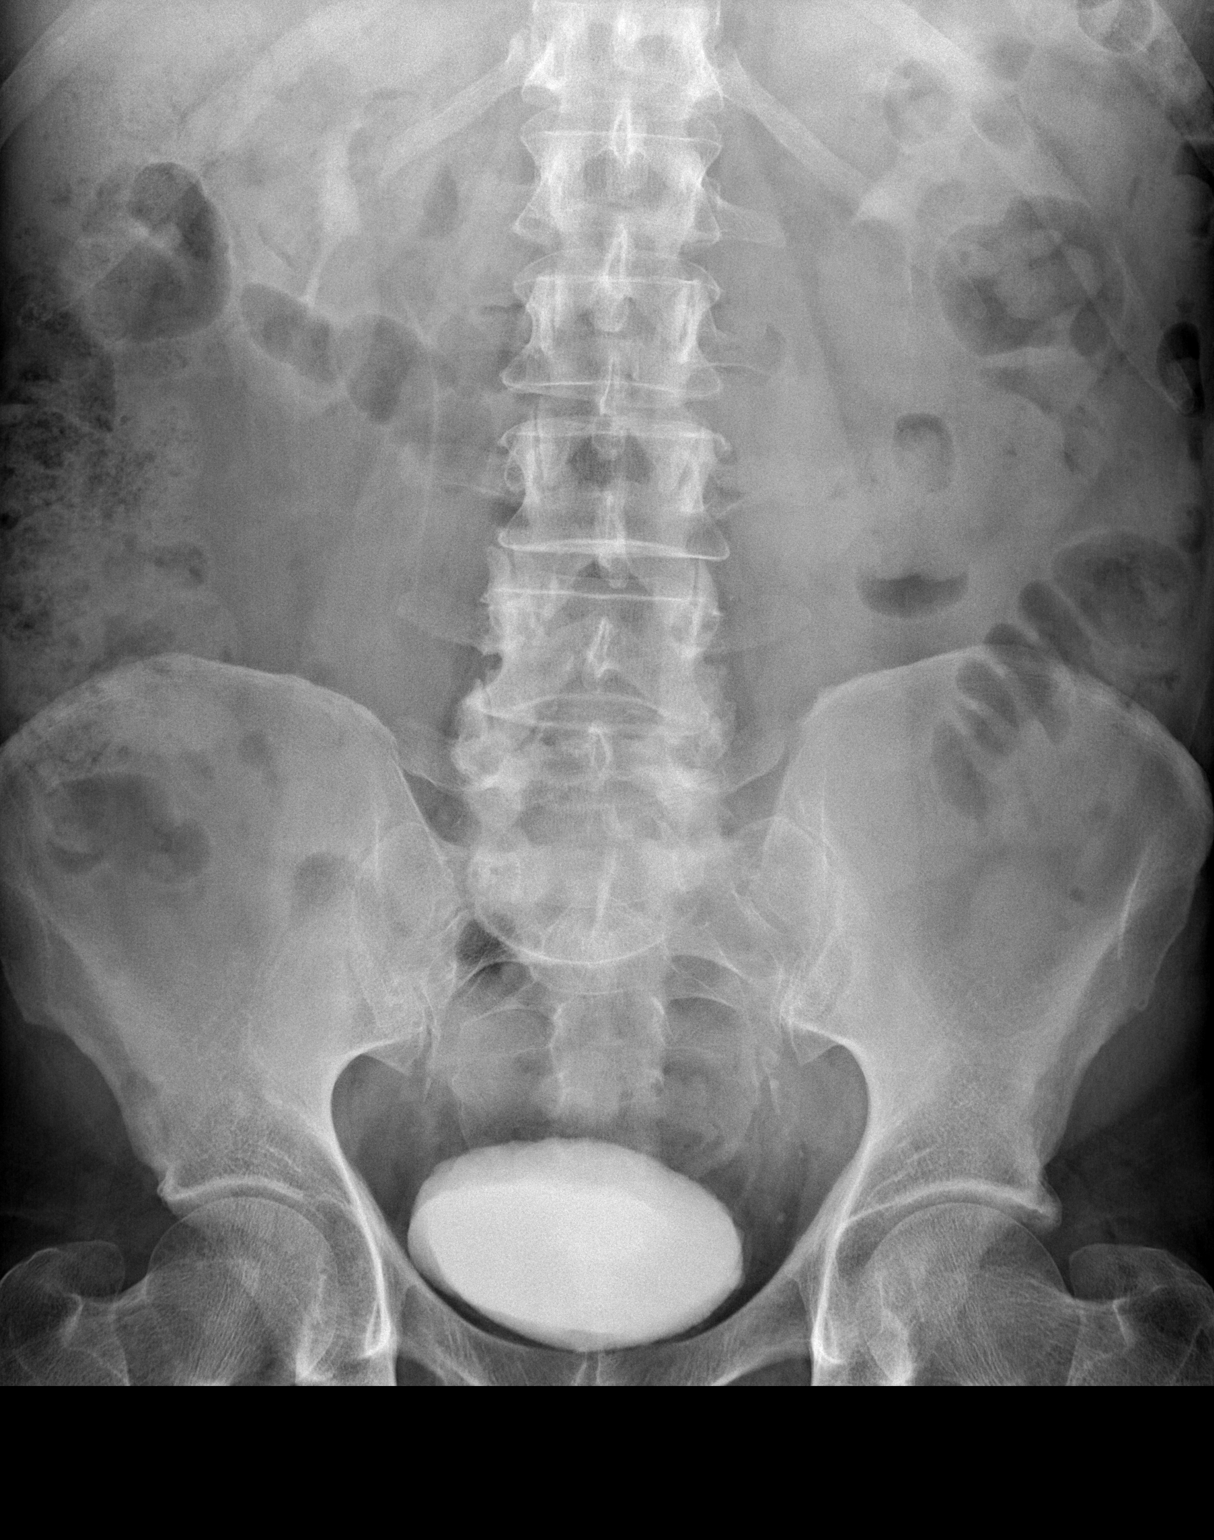

[t abdomen supine (2 of 2)]
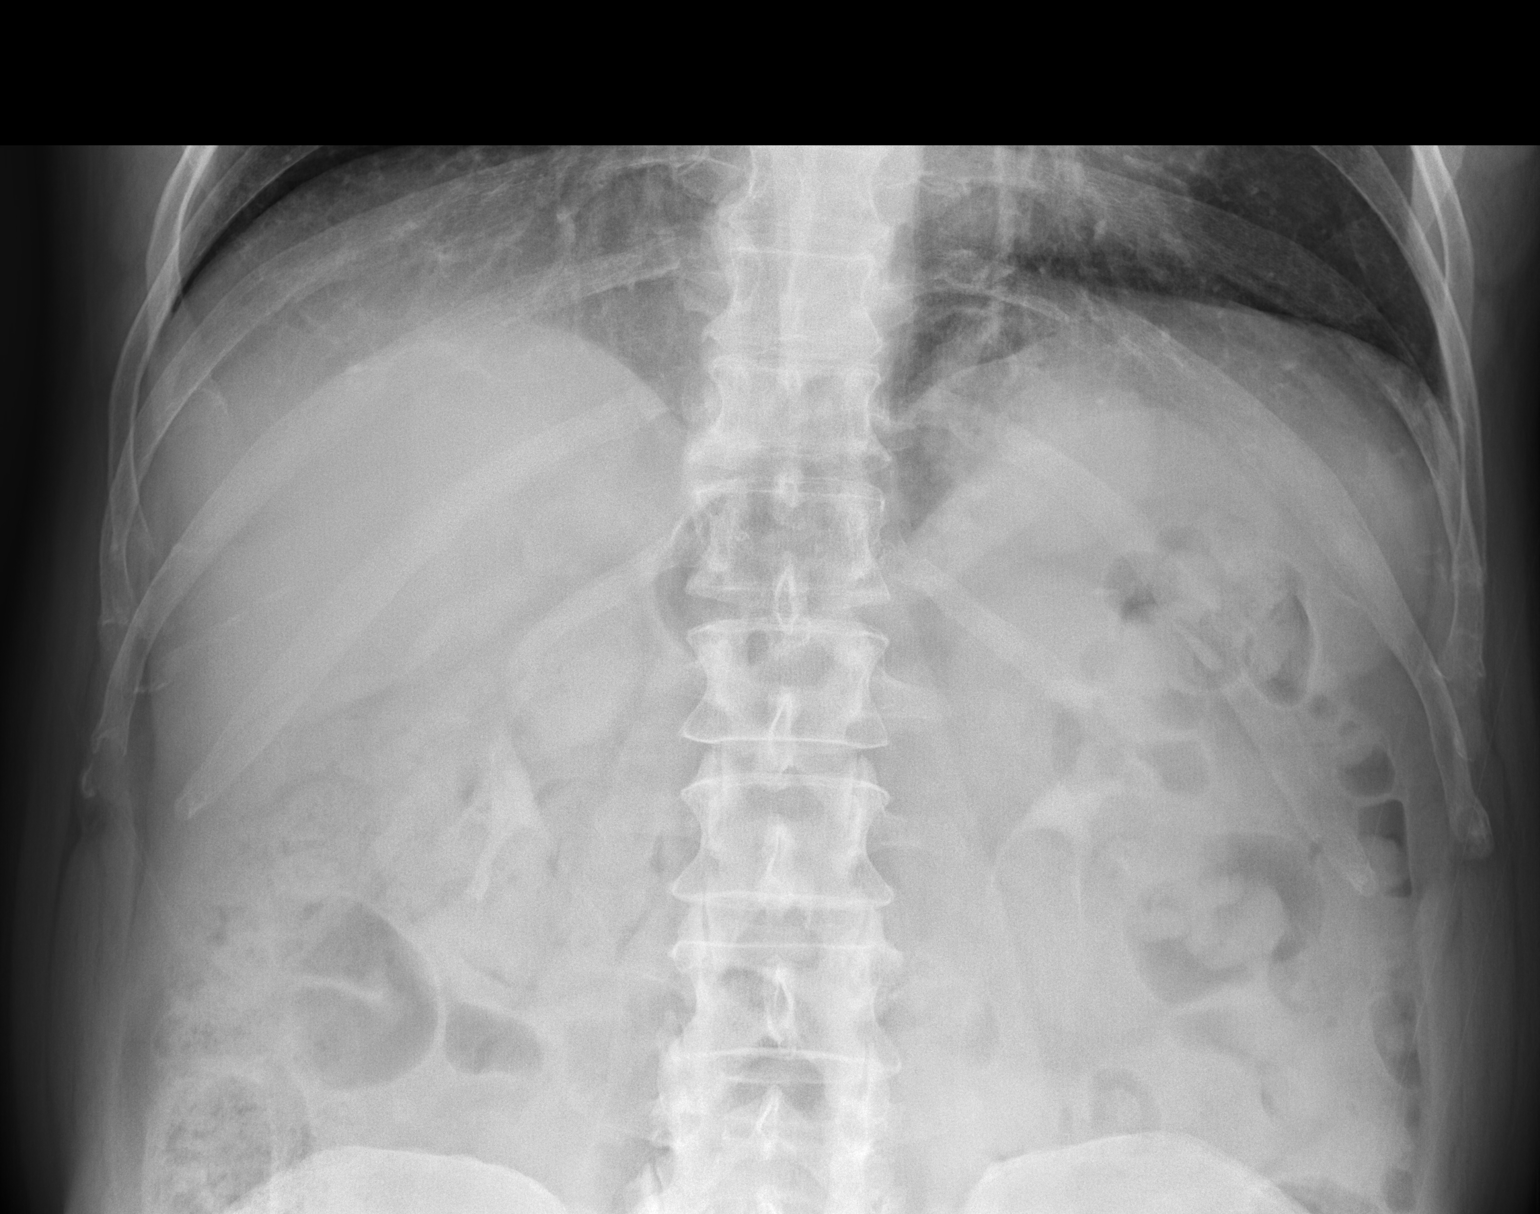

[2 of 2 positions shown; findings below may reference images not displayed]

FINDINGS: Moderate stool burden throughout the colon. The visualized prominent
left lower abdominal small bowel loop by CT earlier not well
visualized by plain film. No organomegaly or free air. Contrast
material noted within the renal collecting systems, ureters and
bladder. No acute bony abnormality.
IMPRESSION: The previously seen prominent left lower abdominal small bowel loop
not well visualized by plain film.

Moderate stool burden throughout the colon.

## 2018-09-29 ENCOUNTER — Other Ambulatory Visit: Payer: Self-pay | Admitting: Pulmonary Disease

## 2018-09-29 DIAGNOSIS — G4733 Obstructive sleep apnea (adult) (pediatric): Secondary | ICD-10-CM

## 2018-09-29 NOTE — Telephone Encounter (Signed)
rec'd email from Cyndi Bender today- Dr. 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 I am estimating that he got the machine around 2005 after his first one was recalled. Also, we got the letter from insurance saying that the home sleep study had been approved. I'm not sure if that is still an option since his machine is probably too out of date to be adjusted to the point that he can tolerate it. Please let me know what my next steps need to be as far as getting him set up. Timothy Hall [phone]  Patient was last seen on 09/17/2018  Rigoberto Noel, MD (Physician)    Schedule home sleep test. Based on this we will get you new CPAP supplies including an air fit F 30 fullface mask.  You likely need a CPAP titration study for desensitization and to get you comfortable on the machine  Call me back and let me know if you have an auto machine     Placed order for HST today. RA please advise. Thank you.

## 2018-10-01 DIAGNOSIS — E041 Nontoxic single thyroid nodule: Secondary | ICD-10-CM | POA: Diagnosis not present

## 2018-10-01 DIAGNOSIS — F17200 Nicotine dependence, unspecified, uncomplicated: Secondary | ICD-10-CM | POA: Diagnosis not present

## 2018-10-01 DIAGNOSIS — J3489 Other specified disorders of nose and nasal sinuses: Secondary | ICD-10-CM | POA: Diagnosis not present

## 2018-10-01 NOTE — Telephone Encounter (Signed)
Will await home sleep study and subsequent titration and then decide on settings. He will likely need auto settings if his pressure requirement is high

## 2018-10-04 ENCOUNTER — Other Ambulatory Visit: Payer: Self-pay | Admitting: Otolaryngology

## 2018-10-04 DIAGNOSIS — E041 Nontoxic single thyroid nodule: Secondary | ICD-10-CM

## 2018-10-11 ENCOUNTER — Other Ambulatory Visit: Payer: Self-pay | Admitting: Medical

## 2018-11-01 ENCOUNTER — Telehealth: Payer: Self-pay | Admitting: Oncology

## 2018-11-01 ENCOUNTER — Inpatient Hospital Stay: Payer: BLUE CROSS/BLUE SHIELD | Attending: Oncology | Admitting: Oncology

## 2018-11-01 DIAGNOSIS — C49A2 Gastrointestinal stromal tumor of stomach: Secondary | ICD-10-CM

## 2018-11-01 DIAGNOSIS — E041 Nontoxic single thyroid nodule: Secondary | ICD-10-CM

## 2018-11-01 DIAGNOSIS — Z79899 Other long term (current) drug therapy: Secondary | ICD-10-CM | POA: Diagnosis not present

## 2018-11-01 NOTE — Telephone Encounter (Signed)
Scheduled appt per 4/13 los.  Wife aware of appt date and time.

## 2018-11-01 NOTE — Progress Notes (Signed)
[place] OFFICE VISIT PROGRESS NOTE  I connected with@ on 11/01/18 at  8:00 AM EDT by telephone and verified that I am speaking with the correct person using two identifiers.     Other persons participating in the visit and their role in the encounter: Wife of Timothy Hall  Patient's location: Home Provider's location: Office   Diagnosis: Gastrointestinal stromal tumor  INTERVAL HISTORY:     Timothy Hall has a history of gastrointestinal stromal tumor dating to December 2014.  He reports feeling well.  No complaint.  No nausea, or difficulty with bowel function.  He had transient discomfort in the abdomen after working on a car.  This has resolved. He saw his primary physician and reported neck discomfort.  The discomfort has resolved.  A carotid Doppler was unremarkable, but a nodule was noted in the left thyroid. He was referred for a thyroid ultrasound on 09/07/2018.  A 3.9 cm solid nodule was noted in the left mid thyroid.  An FNA biopsy was recommended.  Other nodules did not meet criteria for a biopsy recommendation.  He was referred to interventional radiology for thyroid biopsy.  This has been canceled repeatedly.   Lab Results:  Lab Results  Component Value Date   WBC 9.1 09/17/2018   HGB 15.2 09/17/2018   HCT 45.7 09/17/2018   MCV 91.6 09/17/2018   PLT 295.0 09/17/2018   NEUTROABS 5.2 09/17/2018    Imaging:  No results found.  Medications: I have reviewed the patient's current medications.  Assessment/Plan: 1. Gastrointestinal stromal tumor stage II (T4 NX), most likely arising from the stomach, status post surgical resection including a partial gastrectomy on 07/19/2013, 19 cm cystic mass with a low mitotic rate.  Initiation of adjuvant Gleevec 08/31/2013.   Gleevec dose reduced to 400 mg every other day beginning 10/18/2013 secondary to neutropenia.   Gleevec dose adjusted to 200 mg daily beginning  11/29/2013.   Gleevec dose adjusted to 300 mg daily beginning 12/13/2013.  Upper endoscopy 07/12/2014 without evidence of recurrent tumor in the stomach  Gleevec completed February 2018 2. Mild skin rash most likely secondary to [place]. 3. History of Mild periorbital edema secondary to [place]. 4. Severe neutropenia secondary to [facility] 10/12/2013. Resolved. 5. History of Mild lower extremity edema likely related to [place]. 6. Admission 07/18/2014 with a small bowel obstruction-resolved with bowel rest, CT 07/18/2014 without evidence of recurrent tumor. Follow-up CT 02/16/2015 with no evidence of gastric region mass or wall thickening. 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 7. Status post evaluation in the emergency department for abdominal pain 05/12/2016. CT with no evidence for acute intra-abdominal or pelvic pathology. Sigmoid colon diverticulosis with no definite surrounding bowel inflammation. Slight decreased size of fat density masslike area within the upper pelvis thought to represent omental infarct; scatteredperipheral calcifications now present within the lesion. 8. Admission 07/20/2018 with a recurrent small bowel obstruction, CT negative for evidence of recurrent gastrointestinal stromal tumor   Disposition: Timothy Hall is in clinical remission from the gastrointestinal stromal tumor.  I noted the vitamin B12 level was borderline low on 09/17/2018.  I recommended he have this checked again at the next office visit with his primary provider.  He is at increased risk of developing vitamin B12 deficiency secondary to the partial gastrectomy.  He plans to reschedule the thyroid biopsy for after the covid pandemic.  He will contact me if the biopsy reveals a malignancy.  Mr.  Hall will be scheduled for an office visit in 6-8 months.  He will contact us in the interim as needed.  I provided approximately 22 minutes minutes of telephone and documentation time  during this encounter, and > 50% was spent counseling as documented under my assessment & plan.  Betsy Coder ANP/GNP-BC   11/01/2018 7:47 AM

## 2018-11-09 ENCOUNTER — Other Ambulatory Visit: Payer: BLUE CROSS/BLUE SHIELD

## 2018-11-12 ENCOUNTER — Other Ambulatory Visit: Payer: Self-pay

## 2018-11-12 ENCOUNTER — Ambulatory Visit: Payer: BLUE CROSS/BLUE SHIELD

## 2018-11-12 DIAGNOSIS — G4733 Obstructive sleep apnea (adult) (pediatric): Secondary | ICD-10-CM

## 2018-11-13 ENCOUNTER — Other Ambulatory Visit: Payer: Self-pay | Admitting: Medical

## 2018-12-01 ENCOUNTER — Encounter: Payer: Self-pay | Admitting: Pulmonary Disease

## 2018-12-07 ENCOUNTER — Other Ambulatory Visit (HOSPITAL_COMMUNITY)
Admission: RE | Admit: 2018-12-07 | Discharge: 2018-12-07 | Disposition: A | Discharge status: Home / Self Care | Payer: BLUE CROSS/BLUE SHIELD | Source: Ambulatory Visit | Attending: Radiology | Admitting: Radiology

## 2018-12-07 ENCOUNTER — Ambulatory Visit
Admission: RE | Admit: 2018-12-07 | Discharge: 2018-12-07 | Disposition: A | Discharge status: Home / Self Care | Payer: BLUE CROSS/BLUE SHIELD | Source: Ambulatory Visit | Attending: Otolaryngology | Admitting: Otolaryngology

## 2018-12-07 ENCOUNTER — Other Ambulatory Visit: Payer: BLUE CROSS/BLUE SHIELD

## 2018-12-07 DIAGNOSIS — E041 Nontoxic single thyroid nodule: Secondary | ICD-10-CM | POA: Diagnosis not present

## 2018-12-07 DIAGNOSIS — Z853 Personal history of malignant neoplasm of breast: Secondary | ICD-10-CM | POA: Diagnosis not present

## 2018-12-14 ENCOUNTER — Other Ambulatory Visit: Payer: Self-pay | Admitting: Medical

## 2018-12-25 ENCOUNTER — Other Ambulatory Visit: Payer: Self-pay | Admitting: Medical

## 2018-12-29 DIAGNOSIS — E041 Nontoxic single thyroid nodule: Secondary | ICD-10-CM | POA: Diagnosis not present

## 2018-12-31 NOTE — H&P (Signed)
HPI:   Timothy Hall is a 63 y.o. male who presents as a consult Patient.   Referring Provider: Saguier, Lowry Bowl,*  Chief complaint: Thyroid nodule.  HPI: During work-up for neck pain a Doppler study revealed thyroid nodules. Ultrasound revealed a dominant left nodule, 3 cm. No symptoms related to that. Currently trying to stop smoking.  PMH/Meds/All/SocHx/FamHx/ROS:   Past Medical History:  Diagnosis Date  . High cholesterol  . Hypertension   History reviewed. No pertinent surgical history.  No family history of bleeding disorders, wound healing problems or difficulty with anesthesia.   Social History   Socioeconomic History  . Marital status: Unknown  Spouse name: Not on file  . Number of children: Not on file  . Years of education: Not on file  . Highest education level: Not on file  Occupational History  . Not on file  Social Needs  . Financial resource strain: Not on file  . Food insecurity:  Worry: Not on file  Inability: Not on file  . Transportation needs:  Medical: Not on file  Non-medical: Not on file  Tobacco Use  . Smoking status: Current Every Day Smoker  . Smokeless tobacco: Never Used  Substance and Sexual Activity  . Alcohol use: Not on file  . Drug use: Not on file  . Sexual activity: Not on file  Lifestyle  . Physical activity:  Days per week: Not on file  Minutes per session: Not on file  . Stress: Not on file  Relationships  . Social connections:  Talks on phone: Not on file  Gets together: Not on file  Attends religious service: Not on file  Active member of club or organization: Not on file  Attends meetings of clubs or organizations: Not on file  Relationship status: Not on file  Other Topics Concern  . Not on file  Social History Narrative  . Not on file   Current Outpatient Medications:  . amLODIPine (NORVASC) 5 MG tablet, Take 5 mg by mouth., Disp: , Rfl:  . atorvastatin (LIPITOR) 10 MG tablet, Take 10 mg by mouth.,  Disp: , Rfl:  . buPROPion XL (WELLBUTRIN XL) 150 MG 24 hr tablet, , Disp: , Rfl:  . pantoprazole (PROTONIX) 20 MG tablet, Take 20 mg by mouth., Disp: , Rfl:   A complete ROS was performed with pertinent positives/negatives noted in the HPI. The remainder of the ROS are negative.   Physical Exam:   Ht 1.803 m (5\' 11" )  Wt 92.5 kg (204 lb)  BMI 28.45 kg/m   General: Healthy and alert, in no distress, breathing easily. Normal affect. In a pleasant mood. Head: Normocephalic, atraumatic. No masses, or scars. Eyes: Pupils are equal, and reactive to light. Vision is grossly intact. No spontaneous or gaze nystagmus. Ears: Ear canals are clear. Tympanic membranes are intact, with normal landmarks and the middle ears are clear and healthy. Hearing: Grossly normal. Nose: Nasal cavities are clear with healthy mucosa, except on the right side mid septum is very dried out and slightly ulcerated, no polyps or exudate. Airways are patent. Face: No masses or scars, facial nerve function is symmetric. Oral Cavity: No mucosal abnormalities are noted. Tongue with normal mobility. Dentition appears healthy. Oropharynx: Tonsils are symmetric. There are no mucosal masses identified. Tongue base appears normal and healthy. Larynx/Hypopharynx: indirect exam reveals healthy, mobile vocal cords, without mucosal lesions in the hypopharynx or larynx. Chest: Deferred Neck: No palpable masses, no cervical adenopathy, no thyroid nodules or enlargement. Neuro: Cranial  nerves II-XII with normal function. Balance: Normal gate. Other findings: none.  Independent Review of Additional Tests or Records:  none  Procedures:  none  Impression & Plans:  Dried out nose, recommend frequent use of nasal saline spray.  Recommend he try and continue efforts to stop smoking.  Dominant thyroid nodule on the left, recommend ultrasound-guided FNA. I am not able to palpate it.

## 2019-01-03 NOTE — Pre-Procedure Instructions (Signed)
[place], [place] [address] Phone: [phone] Fax: [phone]  [facility], [place] [address] [place] [address] Phone: [phone] Fax: [place], [place] [address] [place] [address] Phone: [phone] Fax: [phone]      Your procedure is scheduled on  01-07-19 Friday  Report to [facility] Main Entrance "A" at 0700 A.M., and check in at the Admitting office.  Call this number if you have problems the morning of surgery:  [phone]  Call [phone] if you have any questions prior to your surgery date Monday-Friday 8am-4pm    Remember:  Do not eat or drink after midnight.  Take these medicines the morning of surgery with A SIP OF WATER : amLODipine (NORVASC) atorvastatin (LIPITOR) buPROPion (WELLBUTRIN XL pantoprazole (PROTONIX  As of today, STOP taking any Aspirin (unless otherwise instructed by your surgeon), Aleve, Naproxen, Ibuprofen, Motrin, Advil, Goody's, BC's, all herbal medications, fish oil, and all vitamins.    The Morning of Surgery  Do not wear jewelry, make-up or nail polish.  Do not wear lotions, powders, or perfumes/colognes, or deodorant  Do not shave 48 hours prior to surgery.  Men may shave face and neck.  Do not bring valuables to the hospital.  [facility] is not responsible for any belongings or valuables.  If you are a smoker, DO NOT Smoke 24 hours prior to surgery IF you wear a CPAP at night please bring your mask, tubing, and machine the morning of surgery   Remember that you must have someone to transport you home after your surgery, and remain with you for 24 hours if you are discharged the same day.   Contacts, glasses, hearing aids, dentures or bridgework may not be worn into surgery.    Leave your suitcase in the car.  After  surgery it may be brought to your room.  For patients admitted to the hospital, discharge time will be determined by your treatment team.  Patients discharged the day of surgery will not be allowed to drive home.    Special instructions:   [place]- Preparing For Surgery  Before surgery, you can play an important role. Because skin is not sterile, your skin needs to be as free of germs as possible. You can reduce the number of germs on your skin by washing with CHG (chlorahexidine gluconate) Soap before surgery.  CHG is an antiseptic cleaner which kills germs and bonds with the skin to continue killing germs even after washing.    Oral Hygiene is also important to reduce your risk of infection.  Remember - BRUSH YOUR TEETH THE MORNING OF SURGERY WITH YOUR REGULAR TOOTHPASTE  Please do not use if you have an allergy to CHG or antibacterial soaps. If your skin becomes reddened/irritated stop using the CHG.  Do not shave (including legs and underarms) for at least 48 hours prior to first CHG shower. It is OK to shave your face.  Please follow these instructions carefully.   1. Shower the NIGHT BEFORE SURGERY and the MORNING OF SURGERY with CHG Soap.   2. If you chose to wash your hair, wash your hair first as usual with your normal shampoo.  3. After you shampoo, rinse your hair and body thoroughly to remove the shampoo.  4. Use CHG as you would any other liquid  soap. You can apply CHG directly to the skin and wash gently with a scrungie or a clean washcloth.   5. Apply the CHG Soap to your body ONLY FROM THE NECK DOWN.  Do not use on open wounds or open sores. Avoid contact with your eyes, ears, mouth and genitals (private parts). Wash Face and genitals (private parts)  with your normal soap.   6. Wash thoroughly, paying special attention to the area where your surgery will be performed.  7. Thoroughly rinse your body with warm water from the neck down.  8. DO NOT shower/wash with  your normal soap after using and rinsing off the CHG Soap.  9. Pat yourself dry with a CLEAN TOWEL.  10. Wear CLEAN PAJAMAS to bed the night before surgery, wear comfortable clothes the morning of surgery  11. Place CLEAN SHEETS on your bed the night of your first shower and DO NOT SLEEP WITH PETS.    Day of Surgery:  Do not apply any deodorants/lotions.  Please wear clean clothes to the hospital/surgery center.   Remember to brush your teeth WITH YOUR REGULAR TOOTHPASTE.   Please read over the following fact sheets that you were given.

## 2019-01-04 ENCOUNTER — Encounter (HOSPITAL_COMMUNITY): Payer: Self-pay

## 2019-01-04 ENCOUNTER — Other Ambulatory Visit (HOSPITAL_COMMUNITY)
Admission: RE | Admit: 2019-01-04 | Discharge: 2019-01-04 | Disposition: A | Discharge status: Home / Self Care | Payer: BC Managed Care – PPO | Source: Ambulatory Visit | Attending: Otolaryngology | Admitting: Otolaryngology

## 2019-01-04 ENCOUNTER — Encounter (HOSPITAL_COMMUNITY)
Admission: RE | Admit: 2019-01-04 | Discharge: 2019-01-04 | Disposition: A | Discharge status: Home / Self Care | Payer: BC Managed Care – PPO | Source: Ambulatory Visit | Attending: Otolaryngology | Admitting: Otolaryngology

## 2019-01-04 ENCOUNTER — Other Ambulatory Visit: Payer: Self-pay

## 2019-01-04 ENCOUNTER — Ambulatory Visit (HOSPITAL_COMMUNITY)
Admission: RE | Admit: 2019-01-04 | Discharge: 2019-01-04 | Disposition: A | Discharge status: Home / Self Care | Payer: BC Managed Care – PPO | Source: Ambulatory Visit | Attending: Anesthesiology | Admitting: Anesthesiology

## 2019-01-04 DIAGNOSIS — Z01818 Encounter for other preprocedural examination: Secondary | ICD-10-CM

## 2019-01-04 LAB — CBC
HCT: 46.3 % (ref 39.0–52.0)
Hemoglobin: 14.7 g/dL (ref 13.0–17.0)
MCH: 30 pg (ref 26.0–34.0)
MCHC: 31.7 g/dL (ref 30.0–36.0)
MCV: 94.5 fL (ref 80.0–100.0)
Platelets: 288 10*3/uL (ref 150–400)
RBC: 4.9 MIL/uL (ref 4.22–5.81)
RDW: 13.1 % (ref 11.5–15.5)
WBC: 8.8 10*3/uL (ref 4.0–10.5)
nRBC: 0 % (ref 0.0–0.2)

## 2019-01-04 LAB — BASIC METABOLIC PANEL
Anion gap: 11 (ref 5–15)
BUN: 12 mg/dL (ref 8–23)
CO2: 20 mmol/L — ABNORMAL LOW (ref 22–32)
Calcium: 9.3 mg/dL (ref 8.9–10.3)
Chloride: 108 mmol/L (ref 98–111)
Creatinine, Ser: 1.22 mg/dL (ref 0.61–1.24)
GFR calc Af Amer: >60 mL/min (ref >60)
GFR calc non Af Amer: >60 mL/min (ref >60)
Glucose, Bld: 103 mg/dL — ABNORMAL HIGH (ref 70–99)
Potassium: 4.4 mmol/L (ref 3.5–5.1)
Sodium: 139 mmol/L (ref 135–145)

## 2019-01-04 NOTE — Progress Notes (Addendum)
  Coronavirus Screening  Have you experienced the following symptoms:  Cough yes/no: No Fever (>100.98F)  yes/no: No Runny nose yes/no: No Sore throat yes/no: No Difficulty breathing/shortness of breath  yes/no: No Loss of smell or taste-No Have you or a family member traveled in the last 14 days and where? yes/no: No  PCP - Saguier,Edward, PA  Cardiologist - denies  Hematolology-[name]  Chest x-ray - 01-04-19  EKG - 01-04-19  Stress Test - denies  ECHO -06-03-13   Cardiac Cath - denies  AICD-denies PM-denies LOOP-denies  Sleep Study - Pt has a history of sleep apnea, diagnosed yrs ago. Was using a CPAP which needed to be replaced. Pt had a repeat sleep study ,but it only recorded for 2 minutes.Will get a repeat study done eventually per patient CPAP -   LABS-CBC,BMP  ASA-denies  ERAS-NA  HA1C-NA Fasting Blood Sugar -  Checks Blood Sugar __0___ times a day  Anesthesia-N  Pt denies having chest pain, sob, palpitations or fever at this time. All instructions explained to the pt, with a verbal understanding of the material. Pt agrees to go over the instructions while at home for a better understanding. The opportunity to ask questions was provided.

## 2019-01-05 LAB — NOVEL CORONAVIRUS, NAA (HOSP ORDER, SEND-OUT TO REF LAB; TAT 18-24 HRS): SARS-CoV-2, NAA: NOT DETECTED

## 2019-01-07 ENCOUNTER — Ambulatory Visit (HOSPITAL_COMMUNITY): Payer: BC Managed Care – PPO | Admitting: Certified Registered"

## 2019-01-07 ENCOUNTER — Other Ambulatory Visit: Payer: Self-pay

## 2019-01-07 ENCOUNTER — Observation Stay (HOSPITAL_COMMUNITY)
Admission: RE | Admit: 2019-01-07 | Discharge: 2019-01-08 | Disposition: A | Discharge status: Home / Self Care | Payer: BC Managed Care – PPO | Attending: Otolaryngology | Admitting: Otolaryngology

## 2019-01-07 ENCOUNTER — Encounter (HOSPITAL_COMMUNITY): Payer: Self-pay

## 2019-01-07 ENCOUNTER — Encounter (HOSPITAL_COMMUNITY)
Admission: RE | Disposition: A | Discharge status: Home / Self Care | Payer: Self-pay | Source: Home / Self Care | Attending: Otolaryngology

## 2019-01-07 DIAGNOSIS — K219 Gastro-esophageal reflux disease without esophagitis: Secondary | ICD-10-CM | POA: Insufficient documentation

## 2019-01-07 DIAGNOSIS — Z7982 Long term (current) use of aspirin: Secondary | ICD-10-CM | POA: Diagnosis not present

## 2019-01-07 DIAGNOSIS — E042 Nontoxic multinodular goiter: Secondary | ICD-10-CM | POA: Diagnosis not present

## 2019-01-07 DIAGNOSIS — I10 Essential (primary) hypertension: Secondary | ICD-10-CM | POA: Insufficient documentation

## 2019-01-07 DIAGNOSIS — E890 Postprocedural hypothyroidism: Secondary | ICD-10-CM

## 2019-01-07 DIAGNOSIS — F17200 Nicotine dependence, unspecified, uncomplicated: Secondary | ICD-10-CM | POA: Diagnosis not present

## 2019-01-07 DIAGNOSIS — Z9889 Other specified postprocedural states: Secondary | ICD-10-CM

## 2019-01-07 DIAGNOSIS — E7800 Pure hypercholesterolemia, unspecified: Secondary | ICD-10-CM | POA: Insufficient documentation

## 2019-01-07 DIAGNOSIS — Z79899 Other long term (current) drug therapy: Secondary | ICD-10-CM | POA: Diagnosis not present

## 2019-01-07 DIAGNOSIS — E041 Nontoxic single thyroid nodule: Secondary | ICD-10-CM | POA: Diagnosis not present

## 2019-01-07 HISTORY — PX: THYROIDECTOMY: SHX17

## 2019-01-07 SURGERY — THYROIDECTOMY
Anesthesia: General | Laterality: Left

## 2019-01-07 MED ORDER — PROMETHAZINE HCL 25 MG PO TABS: 25.0000 mg | ORAL_TABLET | Freq: Four times a day (QID) | ORAL | Status: DC | PRN

## 2019-01-07 MED ORDER — MEPERIDINE HCL 25 MG/ML IJ SOLN: 6.2500 mg | INTRAMUSCULAR | Status: DC | PRN

## 2019-01-07 MED ORDER — LACTATED RINGERS IV SOLN
INTRAVENOUS | Status: DC
  Administered 2019-01-07: 08:00:00 via INTRAVENOUS

## 2019-01-07 MED ORDER — MIDAZOLAM HCL 2 MG/2ML IJ SOLN
INTRAMUSCULAR | Status: AC
  Filled 2019-01-07: qty 2

## 2019-01-07 MED ORDER — ROCURONIUM BROMIDE 50 MG/5ML IV SOSY
PREFILLED_SYRINGE | INTRAVENOUS | Status: DC | PRN
  Administered 2019-01-07: 30 mg via INTRAVENOUS

## 2019-01-07 MED ORDER — PANTOPRAZOLE SODIUM 20 MG PO TBEC
20.0000 mg | DELAYED_RELEASE_TABLET | Freq: Two times a day (BID) | ORAL | Status: DC
  Administered 2019-01-07: 20 mg via ORAL
  Filled 2019-01-07: qty 1

## 2019-01-07 MED ORDER — 0.9 % SODIUM CHLORIDE (POUR BTL) OPTIME
TOPICAL | Status: DC | PRN
  Administered 2019-01-07: 1000 mL

## 2019-01-07 MED ORDER — PROMETHAZINE HCL 25 MG/ML IJ SOLN: 6.2500 mg | INTRAMUSCULAR | Status: DC | PRN

## 2019-01-07 MED ORDER — ONDANSETRON HCL 4 MG/2ML IJ SOLN
INTRAMUSCULAR | Status: AC
  Filled 2019-01-07: qty 2

## 2019-01-07 MED ORDER — LIDOCAINE 2% (20 MG/ML) 5 ML SYRINGE
INTRAMUSCULAR | Status: AC
  Filled 2019-01-07: qty 5

## 2019-01-07 MED ORDER — SUCCINYLCHOLINE CHLORIDE 200 MG/10ML IV SOSY
PREFILLED_SYRINGE | INTRAVENOUS | Status: AC
  Filled 2019-01-07: qty 10

## 2019-01-07 MED ORDER — IBUPROFEN 100 MG/5ML PO SUSP
400.0000 mg | Freq: Four times a day (QID) | ORAL | Status: DC | PRN
  Filled 2019-01-07: qty 20

## 2019-01-07 MED ORDER — HYDROMORPHONE HCL 1 MG/ML IJ SOLN
INTRAMUSCULAR | Status: AC
  Filled 2019-01-07: qty 1

## 2019-01-07 MED ORDER — KETOROLAC TROMETHAMINE 30 MG/ML IJ SOLN
30.0000 mg | Freq: Once | INTRAMUSCULAR | Status: AC | PRN
  Administered 2019-01-07: 11:00:00 30 mg via INTRAVENOUS

## 2019-01-07 MED ORDER — SUGAMMADEX SODIUM 200 MG/2ML IV SOLN
INTRAVENOUS | Status: DC | PRN
  Administered 2019-01-07: 100 mg via INTRAVENOUS

## 2019-01-07 MED ORDER — HYDROMORPHONE HCL 1 MG/ML IJ SOLN: 0.2500 mg | INTRAMUSCULAR | Status: DC | PRN

## 2019-01-07 MED ORDER — FENTANYL CITRATE (PF) 250 MCG/5ML IJ SOLN
INTRAMUSCULAR | Status: AC
  Filled 2019-01-07: qty 5

## 2019-01-07 MED ORDER — SUCCINYLCHOLINE CHLORIDE 20 MG/ML IJ SOLN
INTRAMUSCULAR | Status: DC | PRN
  Administered 2019-01-07: 120 mg via INTRAVENOUS

## 2019-01-07 MED ORDER — KETOROLAC TROMETHAMINE 30 MG/ML IJ SOLN
INTRAMUSCULAR | Status: AC
  Filled 2019-01-07: qty 1

## 2019-01-07 MED ORDER — HYDROCODONE-ACETAMINOPHEN 5-325 MG PO TABS
1.0000 | ORAL_TABLET | ORAL | Status: DC | PRN
  Filled 2019-01-07: qty 2

## 2019-01-07 MED ORDER — BUPROPION HCL ER (XL) 150 MG PO TB24: 150.0000 mg | ORAL_TABLET | Freq: Every day | ORAL | Status: DC

## 2019-01-07 MED ORDER — LIDOCAINE-EPINEPHRINE 1 %-1:100000 IJ SOLN
INTRAMUSCULAR | Status: AC
  Filled 2019-01-07: qty 1

## 2019-01-07 MED ORDER — PROPOFOL 10 MG/ML IV BOLUS
INTRAVENOUS | Status: AC
  Filled 2019-01-07: qty 20

## 2019-01-07 MED ORDER — VITAMIN B-6 100 MG PO TABS
100.0000 mg | ORAL_TABLET | Freq: Every day | ORAL | Status: DC
  Administered 2019-01-07: 14:00:00 100 mg via ORAL
  Filled 2019-01-07: qty 1

## 2019-01-07 MED ORDER — FENTANYL CITRATE (PF) 100 MCG/2ML IJ SOLN
INTRAMUSCULAR | Status: DC | PRN
  Administered 2019-01-07 (×2): 50 ug via INTRAVENOUS
  Administered 2019-01-07: 100 ug via INTRAVENOUS

## 2019-01-07 MED ORDER — ONDANSETRON HCL 4 MG/2ML IJ SOLN
INTRAMUSCULAR | Status: DC | PRN
  Administered 2019-01-07: 4 mg via INTRAVENOUS

## 2019-01-07 MED ORDER — ROCURONIUM BROMIDE 10 MG/ML (PF) SYRINGE
PREFILLED_SYRINGE | INTRAVENOUS | Status: AC
  Filled 2019-01-07: qty 10

## 2019-01-07 MED ORDER — LIDOCAINE 2% (20 MG/ML) 5 ML SYRINGE
INTRAMUSCULAR | Status: DC | PRN
  Administered 2019-01-07: 100 mg via INTRAVENOUS

## 2019-01-07 MED ORDER — PROMETHAZINE HCL 25 MG RE SUPP
25.0000 mg | Freq: Four times a day (QID) | RECTAL | Status: DC | PRN
  Filled 2019-01-07: qty 1

## 2019-01-07 MED ORDER — PROPOFOL 10 MG/ML IV BOLUS
INTRAVENOUS | Status: DC | PRN
  Administered 2019-01-07: 150 mg via INTRAVENOUS

## 2019-01-07 MED ORDER — MIDAZOLAM HCL 5 MG/5ML IJ SOLN
INTRAMUSCULAR | Status: DC | PRN
  Administered 2019-01-07: 2 mg via INTRAVENOUS

## 2019-01-07 MED ORDER — ATORVASTATIN CALCIUM 10 MG PO TABS: 10.0000 mg | ORAL_TABLET | Freq: Every day | ORAL | Status: DC

## 2019-01-07 MED ORDER — PROMETHAZINE HCL 25 MG RE SUPP: 25.0000 mg | Freq: Four times a day (QID) | RECTAL | 1 refills | Status: DC | PRN

## 2019-01-07 MED ORDER — HYDROCODONE-ACETAMINOPHEN 7.5-325 MG PO TABS: 1.0000 | ORAL_TABLET | Freq: Four times a day (QID) | ORAL | 0 refills | Status: DC | PRN

## 2019-01-07 MED ORDER — AMLODIPINE BESYLATE 5 MG PO TABS: 5.0000 mg | ORAL_TABLET | Freq: Every day | ORAL | Status: DC

## 2019-01-07 MED ORDER — DEXAMETHASONE SODIUM PHOSPHATE 10 MG/ML IJ SOLN
INTRAMUSCULAR | Status: DC | PRN
  Administered 2019-01-07: 5 mg via INTRAVENOUS

## 2019-01-07 MED ORDER — DEXTROSE-NACL 5-0.9 % IV SOLN
INTRAVENOUS | Status: DC
  Administered 2019-01-07: 14:00:00 via INTRAVENOUS

## 2019-01-07 MED ORDER — VITAMIN B-12 1000 MCG PO TABS
1000.0000 ug | ORAL_TABLET | Freq: Every day | ORAL | Status: DC
  Administered 2019-01-07: 1000 ug via ORAL
  Filled 2019-01-07: qty 1

## 2019-01-07 MED ORDER — DEXAMETHASONE SODIUM PHOSPHATE 10 MG/ML IJ SOLN
INTRAMUSCULAR | Status: AC
  Filled 2019-01-07: qty 1

## 2019-01-07 SURGICAL SUPPLY — 44 items
ADH SKN CLS APL DERMABOND .7 (GAUZE/BANDAGES/DRESSINGS) ×1
ADH SKN CLS LQ APL DERMABOND (GAUZE/BANDAGES/DRESSINGS) ×1
BLADE SURG 15 STRL LF DISP TIS (BLADE) IMPLANT
BLADE SURG 15 STRL SS (BLADE)
CANISTER SUCT 3000ML PPV (MISCELLANEOUS) ×3 IMPLANT
CLEANER TIP ELECTROSURG 2X2 (MISCELLANEOUS) ×3 IMPLANT
CONT SPEC 4OZ CLIKSEAL STRL BL (MISCELLANEOUS) ×3 IMPLANT
CORD BIPOLAR FORCEPS 12FT (ELECTRODE) ×3 IMPLANT
COVER SURGICAL LIGHT HANDLE (MISCELLANEOUS) ×3 IMPLANT
COVER WAND RF STERILE (DRAPES) ×3 IMPLANT
DERMABOND ADHESIVE PROPEN (GAUZE/BANDAGES/DRESSINGS) ×2
DERMABOND ADVANCED (GAUZE/BANDAGES/DRESSINGS) ×2
DERMABOND ADVANCED .7 DNX12 (GAUZE/BANDAGES/DRESSINGS) ×1 IMPLANT
DERMABOND ADVANCED .7 DNX6 (GAUZE/BANDAGES/DRESSINGS) IMPLANT
DRAIN HEMOVAC 7FR (DRAIN) IMPLANT
DRAIN SNY 7 FPER (WOUND CARE) ×2 IMPLANT
DRAPE HALF SHEET 40X57 (DRAPES) IMPLANT
ELECT COATED BLADE 2.86 ST (ELECTRODE) ×3 IMPLANT
ELECT REM PT RETURN 9FT ADLT (ELECTROSURGICAL) ×3
ELECTRODE REM PT RTRN 9FT ADLT (ELECTROSURGICAL) ×1 IMPLANT
EVACUATOR SILICONE 100CC (DRAIN) ×3 IMPLANT
FORCEPS BIPOLAR SPETZLER 8 1.0 (NEUROSURGERY SUPPLIES) ×3 IMPLANT
GAUZE 4X4 16PLY RFD (DISPOSABLE) IMPLANT
GLOVE BIO SURGEON STRL SZ 6.5 (GLOVE) IMPLANT
GLOVE BIO SURGEONS STRL SZ 6.5 (GLOVE)
GLOVE ECLIPSE 7.5 STRL STRAW (GLOVE) ×3 IMPLANT
GOWN STRL REUS W/ TWL LRG LVL3 (GOWN DISPOSABLE) ×2 IMPLANT
GOWN STRL REUS W/TWL LRG LVL3 (GOWN DISPOSABLE) ×6
KIT BASIN OR (CUSTOM PROCEDURE TRAY) ×3 IMPLANT
KIT TURNOVER KIT B (KITS) ×3 IMPLANT
NDL PRECISIONGLIDE 27X1.5 (NEEDLE) ×1 IMPLANT
NEEDLE PRECISIONGLIDE 27X1.5 (NEEDLE) ×3 IMPLANT
NS IRRIG 1000ML POUR BTL (IV SOLUTION) ×3 IMPLANT
PAD ARMBOARD 7.5X6 YLW CONV (MISCELLANEOUS) ×6 IMPLANT
PENCIL FOOT CONTROL (ELECTRODE) ×3 IMPLANT
SHEARS HARMONIC 9CM CVD (BLADE) ×3 IMPLANT
STAPLER VISISTAT 35W (STAPLE) ×3 IMPLANT
SUT CHROMIC 3 0 PS 2 (SUTURE) ×3 IMPLANT
SUT CHROMIC 4 0 PS 2 18 (SUTURE) IMPLANT
SUT ETHILON 3 0 PS 1 (SUTURE) ×5 IMPLANT
SUT SILK 3 0 REEL (SUTURE) ×3 IMPLANT
SUT SILK 4 0 REEL (SUTURE) ×3 IMPLANT
TOWEL NATURAL 6PK STERILE (DISPOSABLE) ×3 IMPLANT
TRAY ENT MC OR (CUSTOM PROCEDURE TRAY) ×3 IMPLANT

## 2019-01-07 NOTE — Interval H&P Note (Signed)
History and Physical Interval Note:  01/07/2019 8:42 AM  Timothy Hall  has presented today for surgery, with the diagnosis of Thyroid nodule.  The various methods of treatment have been discussed with the patient and family. After consideration of risks, benefits and other options for treatment, the patient has consented to  Procedure(s): Left thyroid lobectomy, with frozen section with possible total THYROIDECTOMY (Left) as a surgical intervention.  The patient's history has been reviewed, patient examined, no change in status, stable for surgery.  I have reviewed the patient's chart and labs.  Questions were answered to the patient's satisfaction.     [name]

## 2019-01-07 NOTE — Transfer of Care (Signed)
Immediate Anesthesia Transfer of Care Note  Patient: Timothy Hall  Procedure(s) Performed: Left thyroid lobectomy, with frozen section (Left )  Patient Location: PACU  Anesthesia Type:General  Level of Consciousness: awake, alert , oriented and drowsy  Airway & Oxygen Therapy: Patient Spontanous Breathing and Patient connected to nasal cannula oxygen  Post-op Assessment: Report given to RN and Post -op Vital signs reviewed and stable  Post vital signs: Reviewed and stable  Last Vitals:  Vitals Value Taken Time  BP 174/80 01/07/19 1048  Temp    Pulse 78 01/07/19 1048  Resp 12 01/07/19 1048  SpO2 97 % 01/07/19 1048  Vitals shown include unvalidated device data.  Last Pain:  Vitals:   01/07/19 0743  TempSrc:   PainSc: 0-No pain      Patients Stated Pain Goal: 2 (82/95/62 1308)  Complications: No apparent anesthesia complications

## 2019-01-07 NOTE — Anesthesia Preprocedure Evaluation (Signed)
Anesthesia Evaluation  Patient identified by MRN, date of birth, ID band Patient awake    Reviewed: Allergy & Precautions, NPO status , Patient's Chart, lab work & pertinent test results  Airway Mallampati: II       Dental  (+) Poor Dentition, Missing, Chipped,    Pulmonary Current Smoker,    Pulmonary exam normal breath sounds clear to auscultation       Cardiovascular hypertension, Pt. on medications Normal cardiovascular exam Rhythm:Regular Rate:Normal     Neuro/Psych negative neurological ROS  negative psych ROS   GI/Hepatic Neg liver ROS, GERD  Medicated,  Endo/Other  negative endocrine ROS  Renal/GU negative Renal ROS  negative genitourinary   Musculoskeletal negative musculoskeletal ROS (+)   Abdominal Normal abdominal exam  (+)   Peds  Hematology negative hematology ROS (+)   Anesthesia Other Findings   Reproductive/Obstetrics                             Anesthesia Physical Anesthesia Plan  ASA: II  Anesthesia Plan: General   Post-op Pain Management:    Induction: Intravenous  PONV Risk Score and Plan: 2 and Ondansetron, Dexamethasone and Midazolam  Airway Management Planned: Oral ETT  Additional Equipment:   Intra-op Plan:   Post-operative Plan: Extubation in OR  Informed Consent: I have reviewed the patients History and Physical, chart, labs and discussed the procedure including the risks, benefits and alternatives for the proposed anesthesia with the patient or authorized representative who has indicated his/her understanding and acceptance.     Dental advisory given  Plan Discussed with: CRNA  Anesthesia Plan Comments:         Anesthesia Quick Evaluation

## 2019-01-07 NOTE — Progress Notes (Signed)
   ENT Progress Note:  s/p Procedure(s): Left thyroid lobectomy, with frozen section   Subjective: Stable postop  Objective: Vital signs in last 24 hours: Temp:  [97.6 F (36.4 C)-98 F (36.7 C)] 97.6 F (36.4 C) (06/19 1045) Pulse Rate:  [67-83] 72 (06/19 1231) Resp:  [9-22] 22 (06/19 1231) BP: (124-174)/(59-91) 124/80 (06/19 1231) SpO2:  [96 %-100 %] 98 % (06/19 1231) Weight:  [93.4 kg] 93.4 kg (06/19 0725) Weight change:  Last BM Date: 01/06/19  Intake/Output from previous day: No intake/output data recorded. Intake/Output this shift: Total I/O In: 740 [P.O.:240; I.V.:500] Out: 80 [Drains:15; Blood:50]  Labs: No results for input(s): WBC, HGB, HCT, PLT in the last 72 hours. No results for input(s): NA, K, CL, CO2, GLUCOSE, BUN, CALCIUM in the last 72 hours.  Invalid input(s): CREATININR  Studies/Results: No results found.   PHYSICAL EXAM: Inc intact No swelling Nl voice   Assessment/Plan: O/N obs  Plan d/c in am    Timothy Hall 01/07/2019, 5:01 PM

## 2019-01-07 NOTE — Anesthesia Postprocedure Evaluation (Signed)
Anesthesia Post Note  Patient: Timothy Hall  Procedure(s) Performed: Left thyroid lobectomy, with frozen section (Left )     Patient location during evaluation: PACU Anesthesia Type: General Level of consciousness: awake Pain management: pain level controlled Vital Signs Assessment: post-procedure vital signs reviewed and stable Respiratory status: spontaneous breathing Cardiovascular status: stable Postop Assessment: no apparent nausea or vomiting Anesthetic complications: no    Last Vitals:  Vitals:   01/07/19 1230 01/07/19 1231  BP: 124/80 124/80  Pulse: 72 72  Resp: 14 (!) 22  Temp:    SpO2: 98% 98%    Last Pain:  Vitals:   01/07/19 1302  TempSrc:   PainSc: 4    Pain Goal: Patients Stated Pain Goal: 4 (01/07/19 1302)                 Huston Foley

## 2019-01-07 NOTE — Discharge Instructions (Signed)
You may shower and use soap and water. Do not use any creams, oils or ointment. ° °

## 2019-01-07 NOTE — Op Note (Signed)
OPERATIVE REPORT  DATE OF SURGERY: 01/07/2019  PATIENT:  Timothy Hall,  63 y.o. male  PRE-OPERATIVE DIAGNOSIS:  Thyroid nodule  POST-OPERATIVE DIAGNOSIS:  Thyroid nodule  PROCEDURE:  Procedure(s): Left thyroid lobectomy, with frozen section  SURGEON:  Beckie Salts, MD  ASSISTANTS: Jolene Provost PA  ANESTHESIA:   General   EBL: 40 ml  DRAINS: 7 French round JP  LOCAL MEDICATIONS USED:  None  SPECIMEN: Left thyroid lobectomy, frozen section, no papillary carcinoma identified  COUNTS:  Correct  PROCEDURE DETAILS: The patient was taken to the operating room and placed on the operating table in the supine position. A shoulder roll was placed beneath the shoulder blades and the neck was extended. The neck was prepped and draped in a standard fashion. A low collar transverse incision was outlined with a marking pen and was incised with electrocautery. Dissection was continued down through the platysma layer.  Self-retaining retractors were used throughout the case.  The midline fascia was divided.  The isthmus and the left lobe were identified and reflected anteriorly towards the right.  The superior vasculature was identified and ligated between clamps and divided using the harmonic dissector.  Continued dissection along the capsule of the gland proceeded towards the middle thyroid vein and then towards the inferior vasculature.  A suspected superior parathyroid was identified and preserved.  The recurrent nerve was identified after the dissection as it entered into the larynx.  There is ligament was divided using the harmonic dissector.  The isthmus was then divided in a similar fashion.  There was a large multinodular mass involving the left lobe that partially ruptured during the procedure.  The wound was irrigated with saline.  Hemostasis was completed.  The drain was exited through a separate stab incision inferiorly.  The drain was secured in place with a nylon suture. The  midline fascia was reapproximated with interrupted chromic suture. The platysma layer was also reapproximated with interrupted chromic suture. A running subcuticular closure was accomplished. Dermabond was used on the skin. The drain was charged. The patient was awakened, extubated and transferred to recovery in stable condition.   PATIENT DISPOSITION:  To PACU, stable

## 2019-01-07 NOTE — Anesthesia Procedure Notes (Signed)
Procedure Name: Intubation Date/Time: 01/07/2019 9:30 AM Performed by: Trinna Post., CRNA Pre-anesthesia Checklist: Patient identified, Emergency Drugs available, Suction available, Patient being monitored and Timeout performed Patient Re-evaluated:Patient Re-evaluated prior to induction Oxygen Delivery Method: Circle system utilized Preoxygenation: Pre-oxygenation with 100% oxygen Induction Type: IV induction, Rapid sequence and Cricoid Pressure applied Laryngoscope Size: Mac and 3 Grade View: Grade I Tube type: Oral Tube size: 7.0 mm Number of attempts: 1 Airway Equipment and Method: Stylet Placement Confirmation: ETT inserted through vocal cords under direct vision,  positive ETCO2 and breath sounds checked- equal and bilateral Secured at: 23 cm Tube secured with: Tape Dental Injury: Teeth and Oropharynx as per pre-operative assessment

## 2019-01-08 ENCOUNTER — Encounter (HOSPITAL_COMMUNITY): Payer: Self-pay | Admitting: Otolaryngology

## 2019-01-08 DIAGNOSIS — K219 Gastro-esophageal reflux disease without esophagitis: Secondary | ICD-10-CM | POA: Diagnosis not present

## 2019-01-08 DIAGNOSIS — E7800 Pure hypercholesterolemia, unspecified: Secondary | ICD-10-CM | POA: Diagnosis not present

## 2019-01-08 DIAGNOSIS — Z79899 Other long term (current) drug therapy: Secondary | ICD-10-CM | POA: Diagnosis not present

## 2019-01-08 DIAGNOSIS — I10 Essential (primary) hypertension: Secondary | ICD-10-CM | POA: Diagnosis not present

## 2019-01-08 DIAGNOSIS — Z7982 Long term (current) use of aspirin: Secondary | ICD-10-CM | POA: Diagnosis not present

## 2019-01-08 DIAGNOSIS — E042 Nontoxic multinodular goiter: Secondary | ICD-10-CM | POA: Diagnosis not present

## 2019-01-08 DIAGNOSIS — F17200 Nicotine dependence, unspecified, uncomplicated: Secondary | ICD-10-CM | POA: Diagnosis not present

## 2019-01-08 DIAGNOSIS — E041 Nontoxic single thyroid nodule: Secondary | ICD-10-CM | POA: Diagnosis not present

## 2019-01-08 NOTE — Discharge Summary (Signed)
Physician Discharge Summary  Patient ID: Timothy Hall MRN: [id number] DOB/AGE: 62-Jul-1957 62 y.o.  Admit date: 01/07/2019 Discharge date: 01/08/2019  Admission Diagnoses:  Active Problems:   S/P partial thyroidectomy   Discharge Diagnoses:  Same  Surgeries: Procedure(s): Left thyroid lobectomy, with frozen section on 01/07/2019   Consultants: None  Discharged Condition: Improved  Hospital Course: Timothy Hall is an 63 y.o. male who was admitted 01/07/2019 with a diagnosis of Active Problems:   S/P partial thyroidectomy  and went to the operating room on 01/07/2019 and underwent the above named procedures.   Pt stable POD #1. D/C to home.  Recent vital signs:  Vitals:   01/07/19 2032 01/08/19 0539  BP: (!) 143/61 (!) 166/81  Pulse: (!) 102 93  Resp: 18 20  Temp: 98.3 F (36.8 C) 97.6 F (36.4 C)  SpO2: 96% 97%    Recent laboratory studies:  Results for orders placed or performed during the hospital encounter of 01/04/19  Novel Coronavirus, NAA (hospital order; send-out to ref lab)   Specimen: Nasopharyngeal Swab; Respiratory  Result Value Ref Range   SARS-CoV-2, NAA NOT DETECTED NOT DETECTED   Coronavirus Source NASOPHARYNGEAL     Discharge Medications:   Allergies as of 01/08/2019      Reactions   Nicoderm [nicotine] Rash      Medication List    TAKE these medications   amLODipine 5 MG tablet Commonly known as: NORVASC TAKE ONE TABLET BY MOUTH EVERY DAY   atorvastatin 10 MG tablet Commonly known as: LIPITOR TAKE ONE TABLET BY MOUTH EVERY DAY   buPROPion 150 MG 24 hr tablet Commonly known as: WELLBUTRIN XL TAKE ONE TABLET BY MOUTH DAILY.   HYDROcodone-acetaminophen 7.5-325 MG tablet Commonly known as: Norco Take 1 tablet by mouth every 6 (six) hours as needed for moderate pain.   pantoprazole 20 MG tablet Commonly known as: PROTONIX TAKE ONE TABLET BY MOUTH TWICE DAILY   promethazine 25 MG suppository Commonly known as: PHENERGAN Place 1  suppository (25 mg total) rectally every 6 (six) hours as needed for nausea or vomiting.   pyridOXINE 100 MG tablet Commonly known as: VITAMIN B-6 Take 100 mg by mouth daily.   vitamin B-12 1000 MCG tablet Commonly known as: CYANOCOBALAMIN Take 1,000 mcg by mouth daily.       Diagnostic Studies: Dg Chest 2 View  Result Date: 01/04/2019 CLINICAL DATA:  Preop for thyroid surgery. EXAM: CHEST - 2 VIEW COMPARISON:  05/18/2017 FINDINGS: The cardiac silhouette, mediastinal and hilar contours are normal in stable. The lungs are clear. No infiltrates, edema or effusions. No worrisome pulmonary lesions. The bony thorax is intact. IMPRESSION: No acute cardiopulmonary findings. Electronically Signed   By: Marijo Sanes M.D.   On: 01/04/2019 15:27    Disposition: Discharge disposition: 01-Home or Self Care       Discharge Instructions    Diet - low sodium heart healthy   Complete by: As directed    Discharge instructions   Complete by: As directed    1. Limited activity 2. Liquid and soft diet, advance as tolerated 3. May bathe and shower day after surgery 4. Wound care - Gentle cleaning with soap and water 5. DO NOT APPLY ANY OINTMENT 6. Elevate Head of Bed  Please contact [facility] ENT [phone]) for any additional concerns.   Increase activity slowly   Complete by: As directed       Follow-up Information    [name], MD. Schedule an appointment as  soon as possible for a visit in 1 week.   Specialty: Otolaryngology Contact information: [address] [place] [address] [phone]            Signed: Jerrell Belfast 01/08/2019, 8:09 AM

## 2019-01-08 NOTE — Progress Notes (Signed)
Patient discharge instructions given, pt verbalized understanding.  VSS. Denies pain.  Patient left floor via wheelchair accompanied by staff to home by private vehicle.

## 2019-01-08 NOTE — Progress Notes (Signed)
   ENT Progress Note: POD #1 s/p Procedure(s): Left thyroid lobectomy, with frozen section   Subjective: tol po, min pain  Objective: Vital signs in last 24 hours: Temp:  [97.6 F (36.4 C)-98.3 F (36.8 C)] 97.6 F (36.4 C) (06/20 0539) Pulse Rate:  [67-102] 93 (06/20 0539) Resp:  [9-22] 20 (06/20 0539) BP: (124-174)/(59-95) 166/81 (06/20 0539) SpO2:  [96 %-100 %] 97 % (06/20 0539) Weight change:  Last BM Date: 01/06/19  Intake/Output from previous day: 06/19 0701 - 06/20 0700 In: 1126.5 [P.O.:240; I.V.:886.5] Out: 105 [Drains:55; Blood:50] Intake/Output this shift: No intake/output data recorded.  Labs: No results for input(s): WBC, HGB, HCT, PLT in the last 72 hours. No results for input(s): NA, K, CL, CO2, GLUCOSE, BUN, CALCIUM in the last 72 hours.  Invalid input(s): CREATININR  Studies/Results: No results found.   PHYSICAL EXAM: Inc intact JP removed, min d/c No airway   Assessment/Plan: D/C to home    Jerrell Belfast 01/08/2019, 8:04 AM

## 2019-01-17 ENCOUNTER — Other Ambulatory Visit: Payer: Self-pay | Admitting: Medical

## 2019-01-17 MED ORDER — PANTOPRAZOLE SODIUM 20 MG PO TBEC: 20.0000 mg | DELAYED_RELEASE_TABLET | Freq: Two times a day (BID) | ORAL | 0 refills | Status: DC

## 2019-01-17 NOTE — Telephone Encounter (Signed)
REFILL pantoprazole (PROTONIX) 20 MG tablet  PHARMACY [facility] - [place], [place] - [place] [phone] (Phone) [phone] (Fax)   Patients wife called and stated that he will need refills on all medications in the next couple weeks, patient would be okay with coming in or doing a virtual if PCP needs to see him before refilling all medications.   Call back # [phone]

## 2019-01-17 NOTE — Telephone Encounter (Signed)
Rx sent 

## 2019-02-07 ENCOUNTER — Encounter: Payer: Self-pay | Admitting: Medical

## 2019-02-07 ENCOUNTER — Telehealth: Payer: Self-pay | Admitting: Medical

## 2019-02-07 NOTE — Telephone Encounter (Signed)
Patient called.

## 2019-02-07 NOTE — Telephone Encounter (Signed)
Attempted to reach the office, No answer.  Patients wife is calling regarding the patient's appt tomorrow. The patient's wife is stating she has an appt tomorrow as well.  Is the flex card swiped or entered? They share a flex card?  [phone]

## 2019-02-08 ENCOUNTER — Encounter: Payer: Self-pay | Admitting: Medical

## 2019-02-08 ENCOUNTER — Other Ambulatory Visit: Payer: Self-pay

## 2019-02-08 ENCOUNTER — Ambulatory Visit: Payer: BC Managed Care – PPO | Admitting: Medical

## 2019-02-08 VITALS — BP 145/71 | HR 73 | Temp 97.9°F | Resp 16 | Ht 72.0 in | Wt 204.0 lb

## 2019-02-08 DIAGNOSIS — L03012 Cellulitis of left finger: Secondary | ICD-10-CM

## 2019-02-08 MED ORDER — CEPHALEXIN 500 MG PO CAPS: 500.0000 mg | ORAL_CAPSULE | Freq: Two times a day (BID) | ORAL | 0 refills | Status: DC

## 2019-02-08 NOTE — Patient Instructions (Addendum)
You appear to have probable recent paronychia over past 3 weeks or so with some recent trauma to nail.  Will rx keflex for skin infection and paronychia.   Will see over next 10 days if nail will tighten up.  You may have some fungal infection as well. Dystrophic nail. If not improving may get small clipping and do koh/fungal culture. May need antifungal med but for hand this is not as common as toenails.  Follow up 7-10 days

## 2019-02-08 NOTE — Progress Notes (Signed)
Subjective:    Patient ID: Timothy Hall, male    DOB: 1955-08-16, 63 y.o.   MRN: [id number]  HPI  Pt in reports some pain at edge of his left 4th digit nail. Nail is little long. He states yesterday he shoved his hand in pocket nail caught edge of pants. Now nail is little loose. He states little pain present for 3-4 weeks.  Yesterday at base of nail he saw purple appearance of base. Pt feels like nail not growing.    Review of Systems  Constitutional: Negative for chills, fatigue and fever.  HENT: Negative for congestion, ear discharge and ear pain.   Respiratory: Negative for cough, chest tightness, shortness of breath and wheezing.   Cardiovascular: Negative for chest pain and palpitations.  Gastrointestinal: Negative for abdominal pain.  Musculoskeletal: Negative for back pain.  Skin:       See hpi.  Hematological: Negative for adenopathy. Does not bruise/bleed easily.  Psychiatric/Behavioral: Negative for behavioral problems, confusion and dysphoric mood. The patient is not nervous/anxious.     Past Medical History:  Diagnosis Date  . Cancer ([place])   . Complication of anesthesia    slow to awaken  . Gastric neoplasm    Cystic, 18 cm - recently diagnosed October 2014   . GERD (gastroesophageal reflux disease)   . Heart murmur age 4  . History of alcoholism ([place])    Quit drinking in 1985  . Pneumonia age 22   hx of  . Rash    upper back due to gleevac  . Sleep apnea    Intolerant of CPAP     Social History   Socioeconomic History  . Marital status: Married    Spouse name: Not on file  . Number of children: Not on file  . Years of education: Not on file  . Highest education level: Not on file  Occupational History  . Occupation: [employer] engineer: [employer]  . Financial resource strain: Not on file  . Food insecurity    Worry: Not on file    Inability: Not on file  . Transportation needs    Medical: Not on file   Non-medical: Not on file  Tobacco Use  . Smoking status: Current Every Day Smoker    Packs/day: 2.00    Years: 40.00    Pack years: 80.00    Types: Cigarettes  . Smokeless tobacco: Never Used  Substance and Sexual Activity  . Alcohol use: No    Comment: quit in 1985  . Drug use: No    Comment: History of alcoholism  . Sexual activity: Yes  Lifestyle  . Physical activity    Days per week: Not on file    Minutes per session: Not on file  . Stress: Not on file  Relationships  . Social Herbalist on phone: Not on file    Gets together: Not on file    Attends religious service: Not on file    Active member of club or organization: Not on file    Attends meetings of clubs or organizations: Not on file    Relationship status: Not on file  . Intimate partner violence    Fear of current or ex partner: Not on file    Emotionally abused: Not on file    Physically abused: Not on file    Forced sexual activity: Not on file  Other Topics Concern  . Not  on file  Social History Narrative   Married, wife [name]   Has #1 grown daughter and a grandson   Has a dog he is close to   Enjoys working on cars and fishing    Past Surgical History:  Procedure Laterality Date  . breast tumor removed Right 1976  . COLONOSCOPY N/A 11/20/2014   Procedure: COLONOSCOPY;  Surgeon: [name], MD;  Location: [facility];  Service: Endoscopy;  Laterality: N/A;  1030 - moved to 5/2 @ 10:30  . EUS N/A 05/25/2013   Procedure: ESOPHAGEAL ENDOSCOPIC ULTRASOUND (EUS) RADIAL;  Surgeon: [name], MD;  Location: [facility];  Service: Endoscopy;  Laterality: N/A;  . EUS N/A 07/12/2014   EGD by Dr. [name]: small hh, widely patent Schatzki's ring. evidence of prior small gastric wedge resection along body of stomach. no evidence of recurrent tumor.   [name] FINE NEEDLE ASPIRATION N/A 05/25/2013   Procedure: FINE NEEDLE ASPIRATION (FNA) LINEAR;  Surgeon: [name], MD;  Location: [facility];   Service: Endoscopy;  Laterality: N/A;"this part never happend"  . LAPAROSCOPIC GASTRECTOMY N/A 07/19/2013   Procedure: RESECTION OF RETROGASTRIC CYSTIC NEOPLASM WITH PARTIAL GASTRECTOMY;  Surgeon: [name], MD;  Location: [facility];  Service: General;  Laterality: N/A;  . lipoma removed from back  yrs ago  . THYROIDECTOMY Left 01/07/2019   Procedure: Left thyroid lobectomy, with frozen section;  Surgeon: [name], MD;  Location: [facility];  Service: ENT;  Laterality: Left;  . TUMOR REMOVAL     Benign tumor removed from left breast and back area.     Family History  Problem Relation Age of Onset  . Breast cancer Mother   . CAD Brother        Premature disease  . Colon cancer Neg Hx   . Stomach cancer Neg Hx     Allergies  Allergen Reactions  . Nicoderm [Nicotine] Rash    Current Outpatient Medications on File Prior to Visit  Medication Sig Dispense Refill  . amLODipine (NORVASC) 5 MG tablet TAKE ONE TABLET BY MOUTH EVERY DAY (Patient taking differently: Take 5 mg by mouth daily. ) 30 tablet 3  . atorvastatin (LIPITOR) 10 MG tablet TAKE ONE TABLET BY MOUTH EVERY DAY (Patient taking differently: Take 10 mg by mouth daily. ) 30 tablet 3  . buPROPion (WELLBUTRIN XL) 150 MG 24 hr tablet TAKE ONE TABLET BY MOUTH DAILY. (Patient taking differently: Take 150 mg by mouth daily. ) 30 tablet 2  . HYDROcodone-acetaminophen (NORCO) 7.5-325 MG tablet Take 1 tablet by mouth every 6 (six) hours as needed for moderate pain. 20 tablet 0  . pantoprazole (PROTONIX) 20 MG tablet Take 1 tablet (20 mg total) by mouth 2 (two) times daily. 60 tablet 0  . promethazine (PHENERGAN) 25 MG suppository Place 1 suppository (25 mg total) rectally every 6 (six) hours as needed for nausea or vomiting. 12 suppository 1  . pyridOXINE (VITAMIN B-6) 100 MG tablet Take 100 mg by mouth daily.    . vitamin B-12 (CYANOCOBALAMIN) 1000 MCG tablet Take 1,000 mcg by mouth daily.     No current facility-administered  medications on file prior to visit.     BP (!) 165/71   Pulse 73   Temp 97.9 F (36.6 C) (Oral)   Resp 16   Ht 6' (1.829 m)   Wt 204 lb (92.5 kg)   SpO2 100%   BMI 27.67 kg/m       Objective:   Physical  Exam  General- No acute distress. Pleasant patient. Neck- Full range of motion, no jvd Lungs- Clear, even and unlabored. Heart- regular rate and rhythm. Neurologic- CNII- XII grossly intact.  Left hand- 4th digit normal. But nail mid dystrophic Lateral edge of finger/nail area mild tender. On palpation nail mild loose. No obvious subungal hematoma.        Assessment & Plan:  You appear to have probable recent paronychia over past 3 weeks or so with some recent trauma to nail.  Will rx keflex for skin infection and paronychia.   Will see over next 10 days if nail will tighten up.  You may have some fungal infection as well. Dystrophic nail. If not improving may get small clipping and do koh/fungal culture. May need antifungal med but for hand this is not as common as toenails.  Follow up 7-10 days  Mackie Pai, PA-C

## 2019-02-15 ENCOUNTER — Other Ambulatory Visit: Payer: Self-pay | Admitting: Medical

## 2019-02-25 ENCOUNTER — Encounter (HOSPITAL_COMMUNITY): Payer: Self-pay

## 2019-03-03 ENCOUNTER — Other Ambulatory Visit: Payer: Self-pay | Admitting: Medical

## 2019-03-26 ENCOUNTER — Other Ambulatory Visit: Payer: Self-pay | Admitting: Medical

## 2019-04-25 ENCOUNTER — Other Ambulatory Visit: Payer: Self-pay | Admitting: Medical

## 2019-05-06 ENCOUNTER — Other Ambulatory Visit: Payer: Self-pay

## 2019-05-06 ENCOUNTER — Inpatient Hospital Stay: Payer: BC Managed Care – PPO | Attending: Oncology | Admitting: Oncology

## 2019-05-06 VITALS — BP 150/74 | HR 76 | Temp 98.5°F | Resp 18 | Ht 72.0 in | Wt 210.1 lb

## 2019-05-06 DIAGNOSIS — Z79899 Other long term (current) drug therapy: Secondary | ICD-10-CM | POA: Insufficient documentation

## 2019-05-06 DIAGNOSIS — C49A2 Gastrointestinal stromal tumor of stomach: Secondary | ICD-10-CM | POA: Diagnosis not present

## 2019-05-06 DIAGNOSIS — Z23 Encounter for immunization: Secondary | ICD-10-CM | POA: Insufficient documentation

## 2019-05-06 DIAGNOSIS — F17210 Nicotine dependence, cigarettes, uncomplicated: Secondary | ICD-10-CM | POA: Insufficient documentation

## 2019-05-06 MED ORDER — INFLUENZA VAC SPLIT QUAD 0.5 ML IM SUSY
0.5000 mL | PREFILLED_SYRINGE | Freq: Once | INTRAMUSCULAR | Status: AC
  Administered 2019-05-06: 0.5 mL via INTRAMUSCULAR

## 2019-05-06 NOTE — Progress Notes (Signed)
  [name] OFFICE PROGRESS NOTE   Diagnosis: Gastrointestinal stromal tumor  INTERVAL HISTORY:   Timothy Hall returns for scheduled visit.  He feels well.  Good appetite.  No abdominal pain.  He is working.  He continues smoking.  Objective:  Vital signs in last 24 hours:  Blood pressure (!) 150/74, pulse 76, temperature 98.5 F (36.9 C), temperature source Temporal, resp. rate 18, height 6' (1.829 m), weight 210 lb 1.6 oz (95.3 kg), SpO2 100 %.    HEENT: Neck without mass Lymphatics: No cervical, supraclavicular, axillary, or inguinal nodes GI: No mass, nontender, no hepatosplenomegaly, no apparent ascites Vascular: No leg edema   Lab Results:  Lab Results  Component Value Date   WBC 8.8 01/04/2019   HGB 14.7 01/04/2019   HCT 46.3 01/04/2019   MCV 94.5 01/04/2019   PLT 288 01/04/2019   NEUTROABS 5.2 09/17/2018    CMP  Lab Results  Component Value Date   NA 139 01/04/2019   K 4.4 01/04/2019   CL 108 01/04/2019   CO2 20 (L) 01/04/2019   GLUCOSE 103 (H) 01/04/2019   BUN 12 01/04/2019   CREATININE 1.22 01/04/2019   CALCIUM 9.3 01/04/2019   PROT 6.9 09/17/2018   ALBUMIN 4.4 09/17/2018   AST 20 09/17/2018   ALT 34 09/17/2018   ALKPHOS 97 09/17/2018   BILITOT 0.4 09/17/2018   GFRNONAA >60 01/04/2019   GFRAA >60 01/04/2019     Medications: I have reviewed the patient's current medications.   Assessment/Plan: 1. Gastrointestinal stromal tumor stage II (T4 NX), most likely arising from the stomach, status post surgical resection including a partial gastrectomy on 07/19/2013, 19 cm cystic mass with a low mitotic rate.  Initiation of adjuvant Gleevec 08/31/2013.   Gleevec dose reduced to 400 mg every other day beginning 10/18/2013 secondary to neutropenia.   Gleevec dose adjusted to 200 mg daily beginning 11/29/2013.   Gleevec dose adjusted to 300 mg daily beginning 12/13/2013.  Upper endoscopy 07/12/2014 without evidence of recurrent  tumor in the stomach  Gleevec completed February 2018 2. Mild skin rash most likely secondary to [place]. 3. History of Mild periorbital edema secondary to [place]. 4. Severe neutropenia secondary to [facility] 10/12/2013. Resolved. 5. History of Mild lower extremity edema likely related to [place]. 6. Admission 07/18/2014 with a small bowel obstruction-resolved with bowel rest, CT 07/18/2014 without evidence of recurrent tumor. Follow-up CT 02/16/2015 with no evidence of gastric region mass or wall thickening. 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 7. Status post evaluation in the emergency department for abdominal pain 05/12/2016. CT with no evidence for acute intra-abdominal or pelvic pathology. Sigmoid colon diverticulosis with no definite surrounding bowel inflammation. Slight decreased size of fat density masslike area within the upper pelvis thought to represent omental infarct; scatteredperipheral calcifications now present within the lesion. 8. Admission 07/20/2018 with a recurrent small bowel obstruction, CT negative for evidence of recurrent gastrointestinal stromal tumor    Disposition: Timothy Hall is in remission from the gastrointestinal stromal tumor.  He is now almost 6 years out from diagnosis.  He would like to continue follow-up at the Cancer center.  He will return for an office visit in 1 year.  He will contact us in the interim for new symptoms.  I encouraged Timothy Hall to discontinue smoking.  He received an influenza vaccine today.  Betsy Coder, MD  05/06/2019  9:06 AM

## 2019-05-06 NOTE — Addendum Note (Signed)
Addended by: Tania Ade on: 05/06/2019 05:00 PM   Modules accepted: Orders

## 2019-05-16 ENCOUNTER — Ambulatory Visit (INDEPENDENT_AMBULATORY_CARE_PROVIDER_SITE_OTHER): Payer: BC Managed Care – PPO | Admitting: Medical

## 2019-05-16 ENCOUNTER — Encounter: Payer: Self-pay | Admitting: Medical

## 2019-05-16 ENCOUNTER — Other Ambulatory Visit: Payer: Self-pay

## 2019-05-16 VITALS — Temp 97.6°F

## 2019-05-16 DIAGNOSIS — J329 Chronic sinusitis, unspecified: Secondary | ICD-10-CM | POA: Diagnosis not present

## 2019-05-16 DIAGNOSIS — R0989 Other specified symptoms and signs involving the circulatory and respiratory systems: Secondary | ICD-10-CM

## 2019-05-16 DIAGNOSIS — R0981 Nasal congestion: Secondary | ICD-10-CM | POA: Diagnosis not present

## 2019-05-16 MED ORDER — BENZONATATE 100 MG PO CAPS: 100.0000 mg | ORAL_CAPSULE | Freq: Three times a day (TID) | ORAL | 0 refills | Status: DC | PRN

## 2019-05-16 MED ORDER — FLUTICASONE PROPIONATE 50 MCG/ACT NA SUSP: 2.0000 | Freq: Every day | NASAL | 1 refills | Status: DC

## 2019-05-16 MED ORDER — AMOXICILLIN-POT CLAVULANATE 875-125 MG PO TABS: 1.0000 | ORAL_TABLET | Freq: Two times a day (BID) | ORAL | 0 refills | Status: DC

## 2019-05-16 NOTE — Patient Instructions (Signed)
You do have recent nasal congestion, mild fatigue and frontal area headache.  Symptoms for 1 week but not resolving completely.  I do think in light of the current pandemic is best to go ahead and get you tested for Covid.  I did go ahead and place the order today and want you to get tested tomorrow morning to the [facility] on [facility].  During the interim you will stay home from work.  Your work already has you find their potential Covid protocol.  Advised staying home to quarantine pending Covid lab results and pending determining how you respond to treatment.  Presently we will treat for upper respiratory infection with possible sinusitis.  Prescription of Augmentin sent to your pharmacy.  Also sent in Flonase nasal spray and benzonatate cough tablets.  Follow-up to be determined.  Expect Covid test results about Wednesday and then will touch base with you to see how you are doing.  We can follow-up sooner if needed.

## 2019-05-16 NOTE — Progress Notes (Signed)
Subjective:    Patient ID: Timothy Hall, male    DOB: 15-Feb-1956, 63 y.o.   MRN: [id number]  HPI Virtual Visit via Telephone Note  I connected with Timothy Hall on 05/16/19 at  3:40 PM EDT by telephone and verified that I am speaking with the correct person using two identifiers.  Location: Patient: home Provider: office  Pt did not check his vitals today.   I discussed the limitations, risks, security and privacy concerns of performing an evaluation and management service by telephone and the availability of in person appointments. I also discussed with the patient that there may be a patient responsible charge related to this service. The patient expressed understanding and agreed to proceed  .   History of Present Illness: Pt has some nasal congestion, ha and mild fatigue. Pt had symptoms for about a week. Pt states recently did work from 5 pm -1 am. Next day he 7 pm-12:30. He states was little cold and rainy. 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   Pt not reporting sinus pressure but states sinus are   Pt company has him off on covid protocol.   Observations/Objective: General- no acute distress. pleasant, oriented and normal speech.  Assessment and Plan: You do have recent nasal congestion, mild fatigue and frontal area headache.  Symptoms for 1 week but not resolving completely.  I do think in light of the current pandemic is best to go ahead and get you tested for Covid.  I did go ahead and place the order today and want you to get tested tomorrow morning to the [facility] on [facility].  During the interim you will stay home from work.  Your work already has you find their potential Covid protocol.  Advised staying home to quarantine pending Covid lab results and pending determining how you respond to treatment.  Presently we will treat for upper respiratory infection with possible sinusitis.   Prescription of Augmentin sent to your pharmacy.  Also sent in Flonase nasal spray and benzonatate cough tablets.  Follow-up to be determined.  Expect Covid test results about Wednesday and then will touch base with you to see how you are doing.  We can follow-up sooner if needed.  Follow Up Instructions:    I discussed the assessment and treatment plan with the patient. The patient was provided an opportunity to ask questions and all were answered. The patient agreed with the plan and demonstrated an understanding of the instructions.   The patient was advised to call back or seek an in-person evaluation if the symptoms worsen or if the condition fails to improve as anticipated.  I provided 25  minutes of non-face-to-face time during this encounter.   Mackie Pai, PA-C    Review of Systems  Constitutional: Positive for fatigue. Negative for chills and fever.  HENT: Positive for congestion.   Respiratory: Negative for cough, chest tightness, shortness of breath and wheezing.   Cardiovascular: Negative for chest pain and palpitations.  Gastrointestinal: Negative for abdominal distention and abdominal pain.  Musculoskeletal: Negative for back pain.  Neurological: Negative for dizziness, weakness, numbness and headaches.       Frontal area ha/possible sinus pressure.  Hematological: Negative for adenopathy. Does not bruise/bleed easily.  Psychiatric/Behavioral: Negative for agitation and confusion.    Past Medical History:  Diagnosis Date  . Cancer ([place])   . Complication of anesthesia    slow  to awaken  . Gastric neoplasm    Cystic, 18 cm - recently diagnosed October 2014   . GERD (gastroesophageal reflux disease)   . Heart murmur age 65  . History of alcoholism ([place])    Quit drinking in 1985  . Pneumonia age 67   hx of  . Rash    upper back due to gleevac  . Sleep apnea    Intolerant of CPAP     Social History   Socioeconomic History  . Marital status: Married     Spouse name: Not on file  . Number of children: Not on file  . Years of education: Not on file  . Highest education level: Not on file  Occupational History  . Occupation: [employer] engineer: [employer]  . Financial resource strain: Not on file  . Food insecurity    Worry: Not on file    Inability: Not on file  . Transportation needs    Medical: Not on file    Non-medical: Not on file  Tobacco Use  . Smoking status: Current Every Day Smoker    Packs/day: 2.00    Years: 40.00    Pack years: 80.00    Types: Cigarettes  . Smokeless tobacco: Never Used  Substance and Sexual Activity  . Alcohol use: No    Comment: quit in 1985  . Drug use: No    Comment: History of alcoholism  . Sexual activity: Yes  Lifestyle  . Physical activity    Days per week: Not on file    Minutes per session: Not on file  . Stress: Not on file  Relationships  . Social Herbalist on phone: Not on file    Gets together: Not on file    Attends religious service: Not on file    Active member of club or organization: Not on file    Attends meetings of clubs or organizations: Not on file    Relationship status: Not on file  . Intimate partner violence    Fear of current or ex partner: Not on file    Emotionally abused: Not on file    Physically abused: Not on file    Forced sexual activity: Not on file  Other Topics Concern  . Not on file  Social History Narrative   Married, wife [name]   Has #1 grown daughter and a grandson   Has a dog he is close to   Enjoys working on cars and fishing    Past Surgical History:  Procedure Laterality Date  . breast tumor removed Right 1976  . COLONOSCOPY N/A 11/20/2014   Procedure: COLONOSCOPY;  Surgeon: [name], MD;  [facility];  Service: Endoscopy;  Laterality: N/A;  1030 - moved to 5/2 @ 10:30  . EUS N/A 05/25/2013   Procedure: ESOPHAGEAL ENDOSCOPIC ULTRASOUND (EUS) RADIAL;  Surgeon: [name], MD;  Location: [facility] ENDOS[facility];  Service: Endoscopy;  Laterality: N/A;  . EUS N/A 07/12/2014   EGD by Dr. [name]: small hh, widely patent Schatzki's ring. evidence of prior small gastric wedge resection along body of stomach. no evidence of recurrent tumor.   [name] FINE NEEDLE ASPIRATION N/A 05/25/2013   Procedure: FINE NEEDLE ASPIRATION (FNA) LINEAR;  Surgeon: [name], MD;  Location: [facility];  Service: Endoscopy;  Laterality: N/A;"this part never happend"  . LAPAROSCOPIC GASTRECTOMY N/A 07/19/2013   Procedure: RESECTION OF RETROGASTRIC CYSTIC NEOPLASM WITH PARTIAL GASTRECTOMY;  Surgeon: [name], MD;  Location: [facility] ORS;  Service: General;  Laterality: N/A;  . lipoma removed from back  yrs ago  . THYROIDECTOMY Left 01/07/2019   Procedure: Left thyroid lobectomy, with frozen section;  Surgeon: [name], MD;  Location: [facility];  Service: ENT;  Laterality: Left;  . TUMOR REMOVAL     Benign tumor removed from left breast and back area.     Family History  Problem Relation Age of Onset  . Breast cancer Mother   . CAD Brother        Premature disease  . Colon cancer Neg Hx   . Stomach cancer Neg Hx     Allergies  Allergen Reactions  . Nicoderm [Nicotine] Rash    Current Outpatient Medications on File Prior to Visit  Medication Sig Dispense Refill  . amLODipine (NORVASC) 5 MG tablet TAKE ONE TABLET BY MOUTH EVERY DAY (Patient taking differently: Take 5 mg by mouth daily. ) 30 tablet 3  . atorvastatin (LIPITOR) 10 MG tablet TAKE ONE TABLET BY MOUTH EVERY DAY (Patient taking differently: Take 10 mg by mouth daily. ) 30 tablet 3  . buPROPion (WELLBUTRIN XL) 150 MG 24 hr tablet TAKE ONE TABLET BY MOUTH DAILY. 30 tablet 2  . co-enzyme Q-10 50 MG capsule Take 50 mg by mouth daily.    . pantoprazole (PROTONIX) 20 MG tablet TAKE ONE TABLET BY MOUTH TWICE DAILY. 60 tablet 0  . thiamine (VITAMIN B-1) 100 MG tablet Take 100 mg by mouth daily.    . vitamin B-12 (CYANOCOBALAMIN)  1000 MCG tablet Take 1,000 mcg by mouth daily.     No current facility-administered medications on file prior to visit.     Temp 97.6 F (36.4 C) (Oral)       Objective:   Physical Exam        Assessment & Plan:

## 2019-05-17 ENCOUNTER — Other Ambulatory Visit: Payer: Self-pay

## 2019-05-17 DIAGNOSIS — Z20822 Contact with and (suspected) exposure to covid-19: Secondary | ICD-10-CM

## 2019-05-17 DIAGNOSIS — Z20828 Contact with and (suspected) exposure to other viral communicable diseases: Secondary | ICD-10-CM | POA: Diagnosis not present

## 2019-05-19 LAB — NOVEL CORONAVIRUS, NAA: SARS-CoV-2, NAA: NOT DETECTED

## 2019-06-10 ENCOUNTER — Other Ambulatory Visit: Payer: Self-pay | Admitting: Medical

## 2019-07-23 ENCOUNTER — Other Ambulatory Visit: Payer: Self-pay | Admitting: Medical

## 2019-08-18 ENCOUNTER — Other Ambulatory Visit: Payer: Self-pay | Admitting: Medical

## 2019-09-02 ENCOUNTER — Other Ambulatory Visit: Payer: Self-pay | Admitting: Medical

## 2019-09-06 ENCOUNTER — Ambulatory Visit: Payer: BC Managed Care – PPO

## 2019-09-07 ENCOUNTER — Other Ambulatory Visit: Payer: Self-pay

## 2019-09-07 ENCOUNTER — Ambulatory Visit (INDEPENDENT_AMBULATORY_CARE_PROVIDER_SITE_OTHER): Payer: BC Managed Care – PPO | Admitting: Medical

## 2019-09-07 DIAGNOSIS — J40 Bronchitis, not specified as acute or chronic: Secondary | ICD-10-CM | POA: Diagnosis not present

## 2019-09-07 DIAGNOSIS — R059 Cough, unspecified: Secondary | ICD-10-CM

## 2019-09-07 DIAGNOSIS — R05 Cough: Secondary | ICD-10-CM

## 2019-09-07 MED ORDER — BENZONATATE 100 MG PO CAPS: 100.0000 mg | ORAL_CAPSULE | Freq: Three times a day (TID) | ORAL | 0 refills | Status: DC | PRN

## 2019-09-07 MED ORDER — FLUTICASONE PROPIONATE 50 MCG/ACT NA SUSP: 2.0000 | Freq: Every day | NASAL | 1 refills | Status: DC

## 2019-09-07 MED ORDER — AZITHROMYCIN 250 MG PO TABS: ORAL_TABLET | ORAL | 0 refills | Status: DC

## 2019-09-07 MED ORDER — ALBUTEROL SULFATE HFA 108 (90 BASE) MCG/ACT IN AERS: 2.0000 | INHALATION_SPRAY | Freq: Four times a day (QID) | RESPIRATORY_TRACT | 0 refills | Status: DC | PRN

## 2019-09-07 NOTE — Patient Instructions (Addendum)
You describe early bronchitis recently after exposure to outdoors extreme cold and wet weather.   I prescribed zpack, benzonatate, flonase and albuterol inhaler.  Work note sent.   If not better by Friday then recommend get covid testing.  Follow up 7 days or as needed

## 2019-09-07 NOTE — Progress Notes (Signed)
   Subjective:    Patient ID: Timothy Hall, male    DOB: Oct 17, 1955, 64 y.o.   MRN: [id number]  HPI  Virtual Visit via Telephone Note  I connected with Timothy Hall on 09/07/19 at  8:40 AM EST by telephone and verified that I am speaking with the correct person using two identifiers.  Location: Patient: home Provider: office  Pt did not check vitals today.   I discussed the limitations, risks, security and privacy concerns of performing an evaluation and management service by telephone and the availability of in person appointments. I also discussed with the patient that there may be a patient responsible charge related to this service. The patient expressed understanding and agreed to proceed.   History of Present Illness: Pt states on Saturday,  Sunday and Monday he was working out in the rain. 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 States out in cold rain all day for 3 days. He feels chest congested and rattling. Pt wife listened to his lungs and she thought had rattle.   Yesterday had some mild sob when he was smoking. Today did not smoke and no sob/wheezing.    Observations/Objective:   General- no acute distress, pleasant, alert and oriented.    Assessment and Plan: You describe early bronchitis recently after exposure to outdoors extreme cold and wet weather.   I prescribed zpack, benzonatate, flonase and albuterol inhaler.  Work note sent.   If not better by Friday then recommend get covid testing.  Follow up 7 days or as needed  Mackie Pai, PA-C  Follow Up Instructions:    I discussed the assessment and treatment plan with the patient. The patient was provided an opportunity to ask questions and all were answered. The patient agreed with the plan and demonstrated an understanding of the instructions.   The patient was advised to call back or seek an in-person evaluation if the symptoms worsen or if the condition fails to improve as  anticipated.  I provided 25 minutes of non-face-to-face time during this encounter.   Mackie Pai, PA-C   Review of Systems  Constitutional: Negative for chills, fatigue and fever.  HENT: Negative for congestion, nosebleeds, postnasal drip, sinus pressure and sinus pain.   Respiratory: Positive for cough. Negative for apnea, chest tightness, shortness of breath and wheezing.        Some productive thick cough.  Cardiovascular: Negative for chest pain and palpitations.  Gastrointestinal: Negative for abdominal pain.  Musculoskeletal: Negative for back pain, joint swelling and neck stiffness.  Skin: Negative for rash.  Neurological: Negative for dizziness, seizures, weakness and headaches.  Hematological: Negative for adenopathy. Does not bruise/bleed easily.  Psychiatric/Behavioral: Negative for behavioral problems, decreased concentration and suicidal ideas. The patient is not nervous/anxious.        Objective:   Physical Exam        Assessment & Plan:

## 2019-10-17 ENCOUNTER — Telehealth: Payer: Self-pay | Admitting: Medical

## 2019-10-17 MED ORDER — BUPROPION HCL ER (XL) 150 MG PO TB24: 150.0000 mg | ORAL_TABLET | Freq: Every day | ORAL | 2 refills | Status: DC

## 2019-10-17 NOTE — Telephone Encounter (Signed)
Medication: buPROPion (WELLBUTRIN XL) 150 MG 24 hr tablet [id number]    Has the patient contacted their pharmacy? Yes.   (If no, request that the patient contact the pharmacy for the refill.) (If yes, when and what did the pharmacy advise?)  Preferred Pharmacy (with phone number or street name):  [place], [place]. [address] Phone:  [phone]  Fax:  [phone]      Agent: Please be advised that RX refills may take up to 3 business days. We ask that you follow-up with your pharmacy.

## 2019-11-23 ENCOUNTER — Telehealth: Payer: Self-pay | Admitting: *Deleted

## 2019-11-23 NOTE — Telephone Encounter (Signed)
Requesting MD to dictate a letter that at baseline, Timothy Hall has intermittent diarrhea related to his GIST diagnosis. When he has diarrhea at work they send him home and have him tested for [place]. Has been required to stay out and be tested x 4 of recent. She will pick up letter when ready.

## 2019-12-02 ENCOUNTER — Encounter: Payer: Self-pay | Admitting: Oncology

## 2019-12-05 ENCOUNTER — Telehealth: Payer: Self-pay | Admitting: *Deleted

## 2019-12-05 NOTE — Telephone Encounter (Signed)
Left VM that requested work letter is completed and at front desk to pick up.

## 2020-05-01 ENCOUNTER — Telehealth: Payer: Self-pay | Admitting: Medical

## 2020-05-01 MED ORDER — PANTOPRAZOLE SODIUM 20 MG PO TBEC: 20.0000 mg | DELAYED_RELEASE_TABLET | Freq: Two times a day (BID) | ORAL | 5 refills | Status: DC

## 2020-05-01 NOTE — Telephone Encounter (Signed)
Rx sent 

## 2020-05-01 NOTE — Telephone Encounter (Signed)
New pharmacy  Medication: pantoprazole (PROTONIX) 20 MG tablet [[id number]]      Has the patient contacted their pharmacy?  (If no, request that the patient contact the pharmacy for the refill.) (If yes, when and what did the pharmacy advise?)     Preferred Pharmacy (with phone number or street name):   [place], [place]. [address] Phone:  [phone]  Fax:  [phone]         Agent: Please be advised that RX refills may take up to 3 business days. We ask that you follow-up with your pharmacy.

## 2020-05-04 ENCOUNTER — Ambulatory Visit: Payer: BC Managed Care – PPO | Admitting: Oncology

## 2020-05-07 ENCOUNTER — Telehealth: Payer: Self-pay | Admitting: Oncology

## 2020-05-07 ENCOUNTER — Inpatient Hospital Stay: Payer: BC Managed Care – PPO | Attending: Oncology | Admitting: Oncology

## 2020-05-07 ENCOUNTER — Other Ambulatory Visit: Payer: Self-pay

## 2020-05-07 VITALS — BP 163/75 | HR 87 | Temp 97.8°F | Resp 18 | Ht 72.0 in | Wt 215.4 lb

## 2020-05-07 DIAGNOSIS — F17210 Nicotine dependence, cigarettes, uncomplicated: Secondary | ICD-10-CM | POA: Insufficient documentation

## 2020-05-07 DIAGNOSIS — Z23 Encounter for immunization: Secondary | ICD-10-CM

## 2020-05-07 DIAGNOSIS — C49A2 Gastrointestinal stromal tumor of stomach: Secondary | ICD-10-CM | POA: Diagnosis not present

## 2020-05-07 DIAGNOSIS — Z79899 Other long term (current) drug therapy: Secondary | ICD-10-CM | POA: Diagnosis not present

## 2020-05-07 DIAGNOSIS — C49A0 Gastrointestinal stromal tumor, unspecified site: Secondary | ICD-10-CM | POA: Diagnosis not present

## 2020-05-07 MED ORDER — INFLUENZA VAC SPLIT QUAD 0.5 ML IM SUSY
PREFILLED_SYRINGE | INTRAMUSCULAR | Status: AC
  Filled 2020-05-07: qty 0.5

## 2020-05-07 MED ORDER — INFLUENZA VAC SPLIT QUAD 0.5 ML IM SUSY
0.5000 mL | PREFILLED_SYRINGE | Freq: Once | INTRAMUSCULAR | Status: AC
  Administered 2020-05-07: 0.5 mL via INTRAMUSCULAR

## 2020-05-07 NOTE — Telephone Encounter (Signed)
Scheduled 10/18 los. No avs or calendar needed to be printed. Sent referral to [facility]. Pt is aware of appt time and date

## 2020-05-07 NOTE — Progress Notes (Signed)
Timothy Hall OFFICE PROGRESS NOTE   Diagnosis: Gastrointestinal stromal tumor  INTERVAL HISTORY:   Timothy Hall returns as scheduled.  He feels well.  Good appetite.  No difficulty with bowel function.  No abdominal pain.  He continues smoking 1-2 packs of cigarettes per day.  Objective:  Vital signs in last 24 hours:  Blood pressure (!) 163/75, pulse 87, temperature 97.8 F (36.6 C), temperature source Tympanic, resp. rate 18, height 6' (1.829 m), weight 215 lb 6.4 oz (97.7 kg), SpO2 100 %.    HEENT: Neck without mass Lymphatics: No cervical, supraclavicular, axillary, or inguinal nodes Resp: Lungs clear bilaterally Cardio: Regular rate and rhythm GI: No mass, nontender, no hepatosplenomegaly, no apparent ascites Vascular: No leg edema, left lower leg is slightly larger than the right side    Lab Results:  Lab Results  Component Value Date   WBC 8.8 01/04/2019   HGB 14.7 01/04/2019   HCT 46.3 01/04/2019   MCV 94.5 01/04/2019   PLT 288 01/04/2019   NEUTROABS 5.2 09/17/2018    CMP  Lab Results  Component Value Date   NA 139 01/04/2019   K 4.4 01/04/2019   CL 108 01/04/2019   CO2 20 (L) 01/04/2019   GLUCOSE 103 (H) 01/04/2019   BUN 12 01/04/2019   CREATININE 1.22 01/04/2019   CALCIUM 9.3 01/04/2019   PROT 6.9 09/17/2018   ALBUMIN 4.4 09/17/2018   AST 20 09/17/2018   ALT 34 09/17/2018   ALKPHOS 97 09/17/2018   BILITOT 0.4 09/17/2018   GFRNONAA >60 01/04/2019   GFRAA >60 01/04/2019    Medications: I have reviewed the patient's current medications.   Assessment/Plan: 1. Gastrointestinal stromal tumor stage II (T4 NX), most likely arising from the stomach, status post surgical resection including a partial gastrectomy on 07/19/2013, 19 cm cystic mass with a low mitotic rate.  Initiation of adjuvant Gleevec 08/31/2013.   Gleevec dose reduced to 400 mg every other day beginning 10/18/2013 secondary to neutropenia.   Gleevec dose adjusted to  200 mg daily beginning 11/29/2013.   Gleevec dose adjusted to 300 mg daily beginning 12/13/2013.  Upper endoscopy 07/12/2014 without evidence of recurrent tumor in the stomach  Gleevec completed February 2018 2. Mild skin rash most likely secondary to [place]. 3. History of Mild periorbital edema secondary to [place]. 4. Severe neutropenia secondary to [facility] 10/12/2013. Resolved. 5. History of Mild lower extremity edema likely related to [place]. 6. Admission 07/18/2014 with a small bowel obstruction-resolved with bowel rest, CT 07/18/2014 without evidence of recurrent tumor. Follow-up CT 02/16/2015 with no evidence of gastric region mass or wall thickening. 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 7. Status post evaluation in the emergency department for abdominal pain 05/12/2016. CT with no evidence for acute intra-abdominal or pelvic pathology. Sigmoid colon diverticulosis with no definite surrounding bowel inflammation. Slight decreased size of fat density masslike area within the upper pelvis thought to represent omental infarct; scatteredperipheral calcifications now present within the lesion. 8. Admission 07/20/2018 with a recurrent small bowel obstruction, CT negative for evidence of recurrent gastrointestinal stromal tumor    Disposition: Timothy Hall is in remission from the gastrointestinal stromal tumor.  He will return for an office visit in 1 year.  He has a longstanding history of cigarette use and continues smoking.  I referred him to the lung cancer screening clinic.  Timothy Hall declines a COVID-19 vaccine.  He received an influenza vaccine today.  Timothy Coder, MD  05/07/2020  8:09 AM

## 2020-07-17 DIAGNOSIS — W57XXXA Bitten or stung by nonvenomous insect and other nonvenomous arthropods, initial encounter: Secondary | ICD-10-CM | POA: Diagnosis not present

## 2020-07-17 DIAGNOSIS — S40262A Insect bite (nonvenomous) of left shoulder, initial encounter: Secondary | ICD-10-CM | POA: Diagnosis not present

## 2020-07-17 DIAGNOSIS — Z6829 Body mass index (BMI) 29.0-29.9, adult: Secondary | ICD-10-CM | POA: Diagnosis not present

## 2020-07-18 ENCOUNTER — Other Ambulatory Visit: Payer: Self-pay | Admitting: *Deleted

## 2020-07-18 DIAGNOSIS — F17210 Nicotine dependence, cigarettes, uncomplicated: Secondary | ICD-10-CM

## 2020-07-28 ENCOUNTER — Encounter: Payer: Self-pay | Admitting: Medical

## 2020-07-30 MED ORDER — AMLODIPINE BESYLATE 5 MG PO TABS
5.0000 mg | ORAL_TABLET | Freq: Every day | ORAL | 3 refills | Status: DC
Start: 2020-07-30 — End: 2020-08-20

## 2020-07-30 MED ORDER — ATORVASTATIN CALCIUM 10 MG PO TABS
10.0000 mg | ORAL_TABLET | Freq: Every day | ORAL | 3 refills | Status: DC
Start: 2020-07-30 — End: 2020-09-03

## 2020-07-30 MED ORDER — BUPROPION HCL ER (XL) 150 MG PO TB24
150.0000 mg | ORAL_TABLET | Freq: Every day | ORAL | 3 refills | Status: DC
Start: 2020-07-30 — End: 2021-02-01

## 2020-08-06 ENCOUNTER — Encounter: Payer: BC Managed Care – PPO | Admitting: Medical

## 2020-08-20 ENCOUNTER — Other Ambulatory Visit: Payer: Self-pay

## 2020-08-20 ENCOUNTER — Encounter: Payer: Self-pay | Admitting: Acute Care

## 2020-08-20 ENCOUNTER — Ambulatory Visit (INDEPENDENT_AMBULATORY_CARE_PROVIDER_SITE_OTHER): Payer: BC Managed Care – PPO | Admitting: Acute Care

## 2020-08-20 ENCOUNTER — Encounter: Payer: Self-pay | Admitting: Medical

## 2020-08-20 ENCOUNTER — Ambulatory Visit
Admission: RE | Admit: 2020-08-20 | Discharge: 2020-08-20 | Disposition: A | Discharge status: Home / Self Care | Payer: BC Managed Care – PPO | Source: Ambulatory Visit | Attending: Acute Care | Admitting: Acute Care

## 2020-08-20 ENCOUNTER — Ambulatory Visit (INDEPENDENT_AMBULATORY_CARE_PROVIDER_SITE_OTHER): Payer: BC Managed Care – PPO | Admitting: Medical

## 2020-08-20 VITALS — BP 140/70 | HR 97 | Temp 98.6°F | Ht 72.0 in | Wt 216.2 lb

## 2020-08-20 VITALS — BP 149/66 | HR 84 | Temp 98.1°F | Resp 18 | Ht 72.0 in | Wt 215.0 lb

## 2020-08-20 DIAGNOSIS — R739 Hyperglycemia, unspecified: Secondary | ICD-10-CM

## 2020-08-20 DIAGNOSIS — J432 Centrilobular emphysema: Secondary | ICD-10-CM | POA: Diagnosis not present

## 2020-08-20 DIAGNOSIS — I2510 Atherosclerotic heart disease of native coronary artery without angina pectoris: Secondary | ICD-10-CM | POA: Diagnosis not present

## 2020-08-20 DIAGNOSIS — Z122 Encounter for screening for malignant neoplasm of respiratory organs: Secondary | ICD-10-CM | POA: Diagnosis not present

## 2020-08-20 DIAGNOSIS — Z0000 Encounter for general adult medical examination without abnormal findings: Secondary | ICD-10-CM | POA: Diagnosis not present

## 2020-08-20 DIAGNOSIS — F17210 Nicotine dependence, cigarettes, uncomplicated: Secondary | ICD-10-CM

## 2020-08-20 DIAGNOSIS — Z1283 Encounter for screening for malignant neoplasm of skin: Secondary | ICD-10-CM

## 2020-08-20 DIAGNOSIS — Z87891 Personal history of nicotine dependence: Secondary | ICD-10-CM | POA: Diagnosis not present

## 2020-08-20 DIAGNOSIS — F17200 Nicotine dependence, unspecified, uncomplicated: Secondary | ICD-10-CM

## 2020-08-20 DIAGNOSIS — Z7185 Encounter for immunization safety counseling: Secondary | ICD-10-CM

## 2020-08-20 DIAGNOSIS — Z125 Encounter for screening for malignant neoplasm of prostate: Secondary | ICD-10-CM | POA: Diagnosis not present

## 2020-08-20 LAB — CBC WITH DIFFERENTIAL/PLATELET
Basophils Absolute: 0.1 10*3/uL (ref 0.0–0.1)
Basophils Relative: 0.5 % (ref 0.0–3.0)
Eosinophils Absolute: 0.3 10*3/uL (ref 0.0–0.7)
Eosinophils Relative: 2.7 % (ref 0.0–5.0)
HCT: 46 % (ref 39.0–52.0)
Hemoglobin: 14.9 g/dL (ref 13.0–17.0)
Lymphocytes Relative: 24.1 % (ref 12.0–46.0)
Lymphs Abs: 3 10*3/uL (ref 0.7–4.0)
MCHC: 32.4 g/dL (ref 30.0–36.0)
MCV: 91.6 fl (ref 78.0–100.0)
Monocytes Absolute: 0.9 10*3/uL (ref 0.1–1.0)
Monocytes Relative: 7.6 % (ref 3.0–12.0)
Neutro Abs: 8.1 10*3/uL — ABNORMAL HIGH (ref 1.4–7.7)
Neutrophils Relative %: 65.1 % (ref 43.0–77.0)
Platelets: 316 10*3/uL (ref 150.0–400.0)
RBC: 5.02 Mil/uL (ref 4.22–5.81)
RDW: 13.9 % (ref 11.5–15.5)
WBC: 12.4 10*3/uL — ABNORMAL HIGH (ref 4.0–10.5)

## 2020-08-20 LAB — LIPID PANEL
Cholesterol: 180 mg/dL (ref 0–200)
HDL: 39 mg/dL — ABNORMAL LOW (ref >39.00)
LDL Cholesterol: 113 mg/dL — ABNORMAL HIGH (ref 0–99)
NonHDL: 140.98
Total CHOL/HDL Ratio: 5
Triglycerides: 142 mg/dL (ref 0.0–149.0)
VLDL: 28.4 mg/dL (ref 0.0–40.0)

## 2020-08-20 LAB — COMPREHENSIVE METABOLIC PANEL
ALT: 44 U/L (ref 0–53)
AST: 21 U/L (ref 0–37)
Albumin: 4.6 g/dL (ref 3.5–5.2)
Alkaline Phosphatase: 82 U/L (ref 39–117)
BUN: 22 mg/dL (ref 6–23)
CO2: 28 mEq/L (ref 19–32)
Calcium: 10.2 mg/dL (ref 8.4–10.5)
Chloride: 105 mEq/L (ref 96–112)
Creatinine, Ser: 1.12 mg/dL (ref 0.40–1.50)
GFR: 69.7 mL/min (ref >60.00)
Glucose, Bld: 109 mg/dL — ABNORMAL HIGH (ref 70–99)
Potassium: 5.2 mEq/L — ABNORMAL HIGH (ref 3.5–5.1)
Sodium: 140 mEq/L (ref 135–145)
Total Bilirubin: 0.4 mg/dL (ref 0.2–1.2)
Total Protein: 7.3 g/dL (ref 6.0–8.3)

## 2020-08-20 LAB — PSA: PSA: 1.37 ng/mL (ref 0.10–4.00)

## 2020-08-20 LAB — HEMOGLOBIN A1C: Hgb A1c MFr Bld: 6.2 % (ref 4.6–6.5)

## 2020-08-20 MED ORDER — AMLODIPINE BESYLATE 10 MG PO TABS
10.0000 mg | ORAL_TABLET | Freq: Every day | ORAL | 3 refills | Status: DC
Start: 2020-08-20 — End: 2021-08-26

## 2020-08-20 NOTE — Patient Instructions (Addendum)
For you wellness exam today I have ordered cbc, cmp, psa , a1c and  lipid panel.  Vaccine counseling on covid vaccine today.  Recommend exercise and healthy diet.  We will let you know lab results as they come in.  Follow up date appointment will be determined after lab review.   For dystrophic nails counseled that it does have appearance of possible fungal infection. Oral antifungal can effect liver. Follow liver enzymes on lab. Other option might be penlac or referral to dermatologist.  Htn. bp is elevated. Best to increase amlodipine to 10 mg daily. Get bp cuff to check bp at home. Ideal would prefer closer to 130/80-120/80.(ask you check bp 3 times a week). Nurse bp check in 7-10 days.  Do recommend you stop smoking/cut back. Get ct screening chest today.  For elevated sugar get A1c today. Eat low sugar diet.   Preventive Care 42-36 Years Old, Male Preventive care refers to lifestyle choices and visits with your health care provider that can promote health and wellness. This includes:  A yearly physical exam. This is also called an annual wellness visit.  Regular dental and eye exams.  Immunizations.  Screening for certain conditions.  Healthy lifestyle choices, such as: ? Eating a healthy diet. ? Getting regular exercise. ? Not using drugs or products that contain nicotine and tobacco. ? Limiting alcohol use. What can I expect for my preventive care visit? Physical exam Your health care provider will check your:  Height and weight. These may be used to calculate your BMI (body mass index). BMI is a measurement that tells if you are at a healthy weight.  Heart rate and blood pressure.  Body temperature.  Skin for abnormal spots. Counseling Your health care provider may ask you questions about your:  Past medical problems.  Family's medical history.  Alcohol, tobacco, and drug use.  Emotional well-being.  Home life and relationship well-being.  Sexual  activity.  Diet, exercise, and sleep habits.  Work and work Statistician.  Access to firearms. What immunizations do I need? Vaccines are usually given at various ages, according to a schedule. Your health care provider will recommend vaccines for you based on your age, medical history, and lifestyle or other factors, such as travel or where you work.   What tests do I need? Blood tests  Lipid and cholesterol levels. These may be checked every 5 years, or more often if you are over 16 years old.  Hepatitis C test.  Hepatitis B test. Screening  Lung cancer screening. You may have this screening every year starting at age 71 if you have a 30-pack-year history of smoking and currently smoke or have quit within the past 15 years.  Prostate cancer screening. Recommendations will vary depending on your family history and other risks.  Genital exam to check for testicular cancer or hernias.  Colorectal cancer screening. ? All adults should have this screening starting at age 26 and continuing until age 35. ? Your health care provider may recommend screening at age 14 if you are at increased risk. ? You will have tests every 1-10 years, depending on your results and the type of screening test.  Diabetes screening. ? This is done by checking your blood sugar (glucose) after you have not eaten for a while (fasting). ? You may have this done every 1-3 years.  STD (sexually transmitted disease) testing, if you are at risk. Follow these instructions at home: Eating and drinking  Eat a diet that  includes fresh fruits and vegetables, whole grains, lean protein, and low-fat dairy products.  Take vitamin and mineral supplements as recommended by your health care provider.  Do not drink alcohol if your health care provider tells you not to drink.  If you drink alcohol: ? Limit how much you have to 0-2 drinks a day. ? Be aware of how much alcohol is in your drink. In the [place] one drink  equals one 12 oz bottle of beer (355 mL), one 5 oz glass of wine (148 mL), or one 1 oz glass of hard liquor (44 mL).   Lifestyle  Take daily care of your teeth and gums. Brush your teeth every morning and night with fluoride toothpaste. Floss one time each day.  Stay active. Exercise for at least 30 minutes 5 or more days each week.  Do not use any products that contain nicotine or tobacco, such as cigarettes, e-cigarettes, and chewing tobacco. If you need help quitting, ask your health care provider.  Do not use drugs.  If you are sexually active, practice safe sex. Use a condom or other form of protection to prevent STIs (sexually transmitted infections).  If told by your health care provider, take low-dose aspirin daily starting at age 39.  Find healthy ways to cope with stress, such as: ? Meditation, yoga, or listening to music. ? Journaling. ? Talking to a trusted person. ? Spending time with friends and family. Safety  Always wear your seat belt while driving or riding in a vehicle.  Do not drive: ? If you have been drinking alcohol. Do not ride with someone who has been drinking. ? When you are tired or distracted. ? While texting.  Wear a helmet and other protective equipment during sports activities.  If you have firearms in your house, make sure you follow all gun safety procedures. What's next?  Go to your health care provider once a year for an annual wellness visit.  Ask your health care provider how often you should have your eyes and teeth checked.  Stay up to date on all vaccines. This information is not intended to replace advice given to you by your health care provider. Make sure you discuss any questions you have with your health care provider. Document Revised: 04/05/2019 Document Reviewed: 07/01/2018 Elsevier Patient Education  2021 [employer].

## 2020-08-20 NOTE — Patient Instructions (Signed)
Thank you for participating in the [facility]. It was our pleasure to meet you today. We will call you with the results of your scan within the next few days. Your scan will be assigned a Lung RADS category score by the physicians reading the scans.  This Lung RADS score determines follow up scanning.  See below for description of categories, and follow up screening recommendations. We will be in touch to schedule your follow up screening annually or based on recommendations of our providers. We will fax a copy of your scan results to your Primary Care Physician, or the physician who referred you to the program, to ensure they have the results. Please call the office if you have any questions or concerns regarding your scanning experience or results.  Our office number is [phone]. Please speak with Denise Phelps, RN. She is our Lung Cancer Screening RN. If she is unavailable when you call, please have the office staff send her a message. She will return your call at her earliest convenience. Remember, if your scan is normal, we will scan you annually as long as you continue to meet the criteria for the program. (Age 55-77, Current smoker or smoker who has quit within the last 15 years). If you are a smoker, remember, quitting is the single most powerful action that you can take to decrease your risk of lung cancer and other pulmonary, breathing related problems. We know quitting is hard, and we are here to help.  Please let us know if there is anything we can do to help you meet your goal of quitting. If you are a former smoker, congratulations. We are proud of you! Remain smoke free! Remember you can refer friends or family members through the number above.  We will screen them to make sure they meet criteria for the program. Thank you for helping us take better care of you by participating in Lung Screening.  Lung RADS Categories:  Lung RADS 1: no nodules  or definitely non-concerning nodules.  Recommendation is for a repeat annual scan in 12 months.  Lung RADS 2:  nodules that are non-concerning in appearance and behavior with a very low likelihood of becoming an active cancer. Recommendation is for a repeat annual scan in 12 months.  Lung RADS 3: nodules that are probably non-concerning , includes nodules with a low likelihood of becoming an active cancer.  Recommendation is for a 6-month repeat screening scan. Often noted after an upper respiratory illness. We will be in touch to make sure you have no questions, and to schedule your 6-month scan.  Lung RADS 4 A: nodules with concerning findings, recommendation is most often for a follow up scan in 3 months or additional testing based on our provider's assessment of the scan. We will be in touch to make sure you have no questions and to schedule the recommended 3 month follow up scan.  Lung RADS 4 B:  indicates findings that are concerning. We will be in touch with you to schedule additional diagnostic testing based on our provider's  assessment of the scan.   

## 2020-08-20 NOTE — Progress Notes (Addendum)
Subjective:    Patient ID: Timothy Hall, male    DOB: [birth year], 65 y.o.   MRN: [id number]  HPI  Pt in for cpe/wellness exam.  Pt had sleep study at home. Only 2 minutes of data. So was rescheduled but he di not want to do.  Pt is still smoking.Pt states walking some part time. I had written wellbutrin to stop smoking but he continues. Wife states it does help his mood. Ct chest screening lung cancer. Will get today.   Pt is not planning to get covid vaccine.  Up to date on colonoscopy.  Hx of htn. Pt did take his medication today. No cardiac or neurologic signs or symptoms.  Pt also mentions discolored thick appearance to nails. He state has noticed for months though he states does not remember exact timing.   Review of Systems  Constitutional: Negative for chills, fatigue and fever.  Respiratory: Negative for cough, chest tightness and shortness of breath.   Cardiovascular: Negative for chest pain and palpitations.  Gastrointestinal: Negative for abdominal pain, blood in stool, diarrhea, rectal pain and vomiting.  Musculoskeletal: Negative for back pain, myalgias and neck stiffness.  Skin: Negative for rash.  Neurological: Negative for dizziness, speech difficulty, weakness and light-headedness.  Hematological: Negative for adenopathy. Does not bruise/bleed easily.  Psychiatric/Behavioral: Negative for behavioral problems and confusion.   Past Medical History:  Diagnosis Date  . Cancer ([place])   . Complication of anesthesia    slow to awaken  . Gastric neoplasm    Cystic, 18 cm - recently diagnosed October 2014   . GERD (gastroesophageal reflux disease)   . Heart murmur age 72  . History of alcoholism ([place])    Quit drinking in 1985  . Pneumonia age 4   hx of  . Rash    upper back due to gleevac  . Sleep apnea    Intolerant of CPAP     Social History   Socioeconomic History  . Marital status: Married    Spouse name: Not on file  . Number of children: Not on  file  . Years of education: Not on file  . Highest education level: Not on file  Occupational History  . Occupation: [employer] engineer: [employer]  Tobacco Use  . Smoking status: Current Every Day Smoker    Packs/day: 2.00    Years: 40.00    Pack years: 80.00    Types: Cigarettes  . Smokeless tobacco: Never Used  Substance and Sexual Activity  . Alcohol use: No    Comment: quit in 1985  . Drug use: No    Comment: History of alcoholism  . Sexual activity: Yes  Other Topics Concern  . Not on file  Social History Narrative   Married, wife [name]   Has #1 grown daughter and a grandson   Has a dog he is close to   Enjoys working on cars and fishing   Social Determinants of Radio broadcast assistant Strain: Not on file  Food Insecurity: Not on file  Transportation Needs: Not on file  Physical Activity: Not on file  Stress: Not on file  Social Connections: Not on file  Intimate Partner Violence: Not on file    Past Surgical History:  Procedure Laterality Date  . breast tumor removed Right 1976  . COLONOSCOPY N/A 11/20/2014   Procedure: COLONOSCOPY;  Surgeon: [name], MD;  [facility];  Service: Endoscopy;  Laterality: N/A;  1030 - moved to 5/2 @ 10:30  . EUS N/A 05/25/2013   Procedure: ESOPHAGEAL ENDOSCOPIC ULTRASOUND (EUS) RADIAL;  Surgeon: [name], MD;  Location: [facility];  Service: Endoscopy;  Laterality: N/A;  . EUS N/A 07/12/2014   EGD by Dr. [name]: small hh, widely patent Schatzki's ring. evidence of prior small gastric wedge resection along body of stomach. no evidence of recurrent tumor.   [name] FINE NEEDLE ASPIRATION N/A 05/25/2013   Procedure: FINE NEEDLE ASPIRATION (FNA) LINEAR;  Surgeon: [name], MD;  Location: [facility];  Service: Endoscopy;  Laterality: N/A;"this part never happend"  . LAPAROSCOPIC GASTRECTOMY N/A 07/19/2013   Procedure: RESECTION OF RETROGASTRIC CYSTIC NEOPLASM WITH PARTIAL  GASTRECTOMY;  Surgeon: [name], MD;  Location: [facility];  Service: General;  Laterality: N/A;  . lipoma removed from back  yrs ago  . THYROIDECTOMY Left 01/07/2019   Procedure: Left thyroid lobectomy, with frozen section;  Surgeon: [name], MD;  Location: [facility];  Service: ENT;  Laterality: Left;  . TUMOR REMOVAL     Benign tumor removed from left breast and back area.     Family History  Problem Relation Age of Onset  . Breast cancer Mother   . CAD Brother        Premature disease  . Colon cancer Neg Hx   . Stomach cancer Neg Hx     Allergies  Allergen Reactions  . Nicoderm [Nicotine] Rash    Current Outpatient Medications on File Prior to Visit  Medication Sig Dispense Refill  . atorvastatin (LIPITOR) 10 MG tablet Take 1 tablet (10 mg total) by mouth daily. 30 tablet 3  . buPROPion (WELLBUTRIN XL) 150 MG 24 hr tablet Take 1 tablet (150 mg total) by mouth daily. 30 tablet 3  . co-enzyme Q-10 50 MG capsule Take 50 mg by mouth daily.    [name] docusate sodium (COLACE) 100 MG capsule Take 100 mg by mouth daily as needed for mild constipation.    . pantoprazole (PROTONIX) 20 MG tablet Take 1 tablet (20 mg total) by mouth 2 (two) times daily. 60 tablet 5  . polyethylene glycol powder (GLYCOLAX/MIRALAX) 17 GM/SCOOP powder Take 17 g by mouth daily as needed.    . thiamine (VITAMIN B-1) 100 MG tablet Take 100 mg by mouth daily.    . vitamin B-12 (CYANOCOBALAMIN) 1000 MCG tablet Take 1,000 mcg by mouth daily.     No current facility-administered medications on file prior to visit.    BP (!) 149/66   Pulse 84   Temp 98.1 F (36.7 C) (Oral)   Resp 18   Ht 6' (1.829 m)   Wt 215 lb (97.5 kg)   SpO2 96%   BMI 29.16 kg/m      Objective:   Physical Exam  General Mental Status- Alert. General Appearance- Not in acute distress.   Skin General: Color- Normal Color. Moisture- Normal Moisture. Nails on both hand thick nails with dystrophic appearance. Scattered small to  moderate sized moles on back.  Neck Carotid Arteries- Normal color. Moisture- Normal Moisture. No carotid bruits. No JVD.  Chest and Lung Exam Auscultation: Breath Sounds:-Normal.  Cardiovascular Auscultation:Rythm- Regular. Murmurs & Other Heart Sounds:Auscultation of the heart reveals- No Murmurs.  Abdomen Inspection:-Inspeection Normal. Palpation/Percussion:Note:No mass. Palpation and Percussion of the abdomen reveal- Non Tender, Non Distended + BS, no rebound or guarding.    Neurologic Cranial Nerve exam:- CN III-XII intact(No nystagmus), symmetric smile. Strength:- 5/5 equal and symmetric strength both upper and  lower extremities.      Assessment & Plan:  For you wellness exam today I have ordered cbc, cmp, psa , a1c and  lipid panel.  Vaccine counseling on covid vaccine today.  Recommend exercise and healthy diet.  We will let you know lab results as they come in.  Follow up date appointment will be determined after lab review.   For dystrophic nails counseled that it does have appearance of possible fungal infection. Oral antifungal can effect liver. Follow liver enzymes on lab. Other option might be penlac or referral to dermatologist.  Htn. bp is elevated. Best to increase amlodipine to 10 mg daily. Get bp cuff to check bp at home. Ideal would prefer closer to 130/80-120/80.  Do recommend you stop smoking/cut back. Get ct screening chest today.  For elevated sugar get A1c today. Eat low sugar diet.  Mackie Pai, [place]    [id number] as addressed htn, dystrophic nails and did vaccine counseling.

## 2020-08-20 NOTE — Progress Notes (Signed)
Shared Decision Making Visit Lung Cancer Screening Program [phone])   Eligibility:  Age 65 y.o.  Pack Years Smoking History Calculation 80 pack years (# packs/per year x # years smoked)  Recent History of coughing up blood  no  Unexplained weight loss? no ( >Than 15 pounds within the last 6 months )  Prior History Lung / other cancer no (Diagnosis within the last 5 years already requiring surveillance chest CT Scans).  Smoking Status Current Smoker  Former Smokers: Years since quit: NA  Quit Date: NA  Visit Components:  Discussion included one or more decision making aids. yes  Discussion included risk/benefits of screening. yes  Discussion included potential follow up diagnostic testing for abnormal scans. yes  Discussion included meaning and risk of over diagnosis. yes  Discussion included meaning and risk of False Positives. yes  Discussion included meaning of total radiation exposure. yes  Counseling Included:  Importance of adherence to annual lung cancer LDCT screening. yes  Impact of comorbidities on ability to participate in the program. yes  Ability and willingness to under diagnostic treatment. yes  Smoking Cessation Counseling:  Current Smokers:   Discussed importance of smoking cessation. yes  Information about tobacco cessation classes and interventions provided to patient. yes  Patient provided with "ticket" for LDCT Scan. yes  Symptomatic Patient. no  Counseling  Diagnosis Code: Tobacco Use Z72.0  Asymptomatic Patient yes  Counseling (Intermediate counseling: > three minutes counseling) [id number]  Former Smokers:   Discussed the importance of maintaining cigarette abstinence. yes  Diagnosis Code: Personal History of Nicotine Dependence. O97.353  Information about tobacco cessation classes and interventions provided to patient. Yes  Patient provided with "ticket" for LDCT Scan. yes  Written Order for Lung Cancer Screening with LDCT  placed in Epic. Yes (CT Chest Lung Cancer Screening Low Dose W/O CM) [id number] Z12.2-Screening of respiratory organs Z87.891-Personal history of nicotine dependence  BP 140/70   Pulse 97   Temp 98.6 F (37 C) (Oral)   Ht 6' (1.829 m)   Wt 216 lb 3.2 oz (98.1 kg)   SpO2 98%   BMI 29.32 kg/m    I have spent 25 minutes of face to face time with Timothy Hall discussing the risks and benefits of lung cancer screening. We viewed a power point together that explained in detail the above noted topics. We paused at intervals to allow for questions to be asked and answered to ensure understanding.We discussed that the single most powerful action that he can take to decrease his risk of developing lung cancer is to quit smoking. We discussed whether or not he is ready to commit to setting a quit date. We discussed options for tools to aid in quitting smoking including nicotine replacement therapy, non-nicotine medications, support groups, Quit Smart classes, and behavior modification. We discussed that often times setting smaller, more achievable goals, such as eliminating 1 cigarette a day for a week and then 2 cigarettes a day for a week can be helpful in slowly decreasing the number of cigarettes smoked. This allows for a sense of accomplishment as well as providing a clinical benefit. I gave him the " Be Stronger Than Your Excuses" card with contact information for community resources, classes, free nicotine replacement therapy, and access to mobile apps, text messaging, and on-line smoking cessation help. I have also given him my card and contact information in the event he needs to contact me. We discussed the time and location of the scan, and that either [facility]  Gerarda Fraction RN or I will call with the results within 24-48 hours of receiving them. I have offered him  a copy of the power point we viewed  as a resource in the event they need reinforcement of the concepts we discussed today in the office. The patient  verbalized understanding of all of  the above and had no further questions upon leaving the office. They have my contact information in the event they have any further questions.  I spent 4 minutes counseling on smoking cessation and the health risks of continued tobacco abuse.  I explained to the patient that there has been a high incidence of coronary artery disease noted on these exams. I explained that this is a non-gated exam therefore degree or severity cannot be determined. This patient is currently on statin therapy. I have asked the patient to follow-up with their PCP regarding any incidental finding of coronary artery disease and management with diet or medication as their PCP  feels is clinically indicated. The patient verbalized understanding of the above and had no further questions upon completion of the visit.      Magdalen Spatz, NP 08/20/2020 3:30 PM

## 2020-08-20 NOTE — Addendum Note (Signed)
Addended by: Anabel Halon on: 08/20/2020 08:43 AM   Modules accepted: Orders

## 2020-08-21 ENCOUNTER — Telehealth: Payer: Self-pay | Admitting: Medical

## 2020-08-21 NOTE — Telephone Encounter (Signed)
Prefer that he wait to see dermatologist. They will probably do test to confirm toenail fungus. Then can start.

## 2020-08-21 NOTE — Telephone Encounter (Signed)
Patients wife notified

## 2020-08-21 NOTE — Telephone Encounter (Signed)
Wife is returning your call in regards to his lab results

## 2020-08-21 NOTE — Telephone Encounter (Signed)
Labs were reviewed & wife wants to know if the medication for the fungus on the nails before their derm appointment in march

## 2020-08-30 NOTE — Progress Notes (Signed)
Please call patient and let them  know their  low dose Ct was read as a Lung RADS 2: nodules that are benign in appearance and behavior with a very low likelihood of becoming a clinically active cancer due to size or lack of growth. Recommendation per radiology is for a repeat LDCT in 12 months. .Please let them  know we will order and schedule their  annual screening scan for 08/2021 Please let them  know there was notation of CAD on their  scan.  Please remind the patient  that this is a non-gated exam therefore degree or severity of disease  cannot be determined. Please have them  follow up with their PCP regarding potential risk factor modification, dietary therapy or pharmacologic therapy if clinically indicated. Pt.  is  currently on statin therapy. Please place order for annual  screening scan for  08/2021 and fax results to PCP. Thanks so much.  Pt. Does have evidence of CAD on scan. They are on statin therapy. No cardiology follow up that I can see in [place]. Please have them follow up with PCP regarding need for cardiology referral . Thanks so much

## 2020-09-03 ENCOUNTER — Other Ambulatory Visit: Payer: Self-pay

## 2020-09-03 ENCOUNTER — Ambulatory Visit: Payer: BC Managed Care – PPO | Admitting: Medical

## 2020-09-03 ENCOUNTER — Encounter: Payer: Self-pay | Admitting: Medical

## 2020-09-03 ENCOUNTER — Other Ambulatory Visit: Payer: Self-pay | Admitting: *Deleted

## 2020-09-03 VITALS — BP 130/60 | HR 80 | Temp 98.0°F | Ht 72.0 in | Wt 213.0 lb

## 2020-09-03 DIAGNOSIS — J449 Chronic obstructive pulmonary disease, unspecified: Secondary | ICD-10-CM

## 2020-09-03 DIAGNOSIS — R739 Hyperglycemia, unspecified: Secondary | ICD-10-CM

## 2020-09-03 DIAGNOSIS — L603 Nail dystrophy: Secondary | ICD-10-CM | POA: Diagnosis not present

## 2020-09-03 DIAGNOSIS — I10 Essential (primary) hypertension: Secondary | ICD-10-CM | POA: Diagnosis not present

## 2020-09-03 DIAGNOSIS — F17200 Nicotine dependence, unspecified, uncomplicated: Secondary | ICD-10-CM | POA: Diagnosis not present

## 2020-09-03 DIAGNOSIS — R059 Cough, unspecified: Secondary | ICD-10-CM | POA: Diagnosis not present

## 2020-09-03 DIAGNOSIS — E785 Hyperlipidemia, unspecified: Secondary | ICD-10-CM

## 2020-09-03 DIAGNOSIS — F17210 Nicotine dependence, cigarettes, uncomplicated: Secondary | ICD-10-CM

## 2020-09-03 MED ORDER — METFORMIN HCL 500 MG PO TABS
500.0000 mg | ORAL_TABLET | Freq: Two times a day (BID) | ORAL | 3 refills | Status: DC
Start: 2020-09-03 — End: 2021-08-02

## 2020-09-03 MED ORDER — ATORVASTATIN CALCIUM 20 MG PO TABS: 20.0000 mg | ORAL_TABLET | Freq: Every day | ORAL | 3 refills | Status: DC

## 2020-09-03 MED ORDER — BUDESONIDE-FORMOTEROL FUMARATE 160-4.5 MCG/ACT IN AERO: 2.0000 | INHALATION_SPRAY | Freq: Two times a day (BID) | RESPIRATORY_TRACT | 3 refills | Status: DC

## 2020-09-03 NOTE — Progress Notes (Signed)
Subjective:    Patient ID: Timothy Hall, male    DOB: December 24, 1955, 65 y.o.   MRN: [id number]  HPI  Pt has history of smoking. Pt knows has smokers cough in morning. Brings up mucus in the morning. Ct showed recently Lungs/Pleura: Mild changes of paraseptal and centrilobular emphysema. No pleural effusion identified.   IMPRESSION: 1. Lung-RADS 2, benign appearance or behavior. Continue annual screening with low-dose chest CT without contrast in 12 months. 2. Coronary artery calcifications. 3. Aortic Atherosclerosis (ICD10-I70.0) and Emphysema (ICD10-J43.9).  Aortic Atherosclerosis (ICD10-I70.0).  Pt has high cholesterol with above findings on ct.  The 10-year ASCVD risk score [name] DC [name]., et al., 2013) is: 24.3%   Values used to calculate the score:     Age: 43 years     Sex: Male     Is Non-Hispanic African American: No     Diabetic: No     Tobacco smoker: Yes     Systolic Blood Pressure: 510 mmHg     Is BP treated: Yes     HDL Cholesterol: 39 mg/dL     Total Cholesterol: 180 mg/dL  Pt smokes 16 cigarettes to 1.5 pack a day.    Review of Systems  Constitutional: Negative for chills, fatigue and fever.  HENT: Negative for congestion and sore throat.   Respiratory: Negative for cough, chest tightness, shortness of breath and wheezing.   Cardiovascular: Negative for chest pain and palpitations.  Gastrointestinal: Negative for abdominal pain, diarrhea, nausea and vomiting.  Genitourinary: Negative for dysuria.  Musculoskeletal: Negative for back pain.  Skin: Negative for rash.  Neurological: Negative for dizziness, tremors, weakness, light-headedness, numbness and headaches.  Hematological: Negative for adenopathy. Does not bruise/bleed easily.  Psychiatric/Behavioral: Negative for behavioral problems and confusion. The patient is not nervous/anxious.     Past Medical History:  Diagnosis Date  . Cancer ([place])   . Complication of anesthesia    slow to awaken  .  Gastric neoplasm    Cystic, 18 cm - recently diagnosed October 2014   . GERD (gastroesophageal reflux disease)   . Heart murmur age 34  . History of alcoholism ([place])    Quit drinking in 1985  . Pneumonia age 4   hx of  . Rash    upper back due to gleevac  . Sleep apnea    Intolerant of CPAP     Social History   Socioeconomic History  . Marital status: Married    Spouse name: Not on file  . Number of children: Not on file  . Years of education: Not on file  . Highest education level: Not on file  Occupational History  . Occupation: [employer] engineer: [employer]  Tobacco Use  . Smoking status: Current Every Day Smoker    Packs/day: 2.00    Years: 40.00    Pack years: 80.00    Types: Cigarettes  . Smokeless tobacco: Never Used  Substance and Sexual Activity  . Alcohol use: No    Comment: quit in 1985  . Drug use: No    Comment: History of alcoholism  . Sexual activity: Yes  Other Topics Concern  . Not on file  Social History Narrative   Married, wife Timothy Hall   Has #1 grown daughter and a grandson   Has a dog he is close to   Enjoys working on cars and fishing   Social Determinants of Radio broadcast assistant Strain: Not on file  Food Insecurity: Not on file  Transportation Needs: Not on file  Physical Activity: Not on file  Stress: Not on file  Social Connections: Not on file  Intimate Partner Violence: Not on file    Past Surgical History:  Procedure Laterality Date  . breast tumor removed Right 1976  . COLONOSCOPY N/A 11/20/2014   Procedure: COLONOSCOPY;  Surgeon: [name], MD;  Location: [facility];  Service: Endoscopy;  Laterality: N/A;  1030 - moved to 5/2 @ 10:30  . EUS N/A 05/25/2013   Procedure: ESOPHAGEAL ENDOSCOPIC ULTRASOUND (EUS) RADIAL;  Surgeon: [name], MD;  Location: [facility];  Service: Endoscopy;  Laterality: N/A;  . EUS N/A 07/12/2014   EGD by Dr. [name]: small hh, widely patent Schatzki's ring.  evidence of prior small gastric wedge resection along body of stomach. no evidence of recurrent tumor.   [name] FINE NEEDLE ASPIRATION N/A 05/25/2013   Procedure: FINE NEEDLE ASPIRATION (FNA) LINEAR;  Surgeon: [name], MD;  Location: [facility];  Service: Endoscopy;  Laterality: N/A;"this part never happend"  . LAPAROSCOPIC GASTRECTOMY N/A 07/19/2013   Procedure: RESECTION OF RETROGASTRIC CYSTIC NEOPLASM WITH PARTIAL GASTRECTOMY;  Surgeon: [name], MD;  Location: [facility];  Service: General;  Laterality: N/A;  . lipoma removed from back  yrs ago  . THYROIDECTOMY Left 01/07/2019   Procedure: Left thyroid lobectomy, with frozen section;  Surgeon: [name], MD;  Location: [facility];  Service: ENT;  Laterality: Left;  . TUMOR REMOVAL     Benign tumor removed from left breast and back area.     Family History  Problem Relation Age of Onset  . Breast cancer Mother   . CAD Brother        Premature disease  . Colon cancer Neg Hx   . Stomach cancer Neg Hx     Allergies  Allergen Reactions  . Nicoderm [Nicotine] Rash    Current Outpatient Medications on File Prior to Visit  Medication Sig Dispense Refill  . amLODipine (NORVASC) 10 MG tablet Take 1 tablet (10 mg total) by mouth daily. 90 tablet 3  . atorvastatin (LIPITOR) 10 MG tablet Take 1 tablet (10 mg total) by mouth daily. 30 tablet 3  . buPROPion (WELLBUTRIN XL) 150 MG 24 hr tablet Take 1 tablet (150 mg total) by mouth daily. 30 tablet 3  . co-enzyme Q-10 50 MG capsule Take 50 mg by mouth daily.    [name] docusate sodium (COLACE) 100 MG capsule Take 100 mg by mouth daily as needed for mild constipation.    . pantoprazole (PROTONIX) 20 MG tablet Take 1 tablet (20 mg total) by mouth 2 (two) times daily. 60 tablet 5  . polyethylene glycol powder (GLYCOLAX/MIRALAX) 17 GM/SCOOP powder Take 17 g by mouth daily as needed.    . thiamine (VITAMIN B-1) 100 MG tablet Take 100 mg by mouth daily.    . vitamin B-12 (CYANOCOBALAMIN) 1000 MCG  tablet Take 1,000 mcg by mouth daily.     No current facility-administered medications on file prior to visit.    BP 130/60   Pulse 80   Temp 98 F (36.7 C) (Oral)   Ht 6' (1.829 m)   Wt 213 lb (96.6 kg)   SpO2 98%   BMI 28.89 kg/m    Objective:   Physical Exam  General Mental Status- Alert. General Appearance- Not in acute distress.   Skin General: Color- Normal Color. Moisture- Normal Moisture.  Neck Carotid Arteries- Normal color. Moisture- Normal  Moisture. No carotid bruits. No JVD.  Chest and Lung Exam Auscultation: Breath Sounds:-Normal.  Cardiovascular Auscultation:Rythm- Regular. Murmurs & Other Heart Sounds:Auscultation of the heart reveals- No Murmurs.  Abdomen Inspection:-Inspeection Normal. Palpation/Percussion:Note:No mass. Palpation and Percussion of the abdomen reveal- Non Tender, Non Distended + BS, no rebound or guarding.    Neurologic Cranial Nerve exam:- CN III-XII intact(No nystagmus), symmetric smile. Strength:- 5/5 equal and symmetric strength both upper and lower extremities.      Assessment & Plan:  History of hypertension.  On blood pressure recheck today level was 130/70.  Continue amlodipine 10 mg daily.  History of heavy smoker and on CT screening for lung cancer it was noted mild COPD present.  Very heavy history of smoking so do want you to quit smoking.  Continue on Wellbutrin.  Decided to go ahead and prescribe Symbicort inhaler and refer you to pulmonologist.  PFTs might be beneficial to assess lung function and wanted very heavy history of smoking.    Hyperlipidemia with coronary artery disease seen on CT lung cancer screening and atherosclerosis of the aorta.  Decided to increase your atorvastatin to 20 mg daily.  Blood sugar elevation in the prediabetic range.  In light of your 10-year cardiovascular risk score decided to go ahead and prescribe Metformin.  Rx advisement given.  Eat low sugar diet.  Follow-up in 3 months or  as needed.  Mackie Pai, PA-C

## 2020-09-03 NOTE — Patient Instructions (Addendum)
History of hypertension.  On blood pressure recheck today level was 130/70.  Continue amlodipine 10 mg daily.  History of heavy smoker and on CT screening for lung cancer it was noted mild COPD present.  Very heavy history of smoking so do want you to quit smoking.  Continue on Wellbutrin.  Decided to go ahead and prescribe Symbicort inhaler and refer you to pulmonologist.  PFTs might be beneficial to assess lung function and wanted very heavy history of smoking.    Hyperlipidemia with coronary artery disease seen on CT lung cancer screening and atherosclerosis of the aorta.  Decided to increase your atorvastatin to 20 mg daily.  Blood sugar elevation in the prediabetic range.  In light of your 10-year cardiovascular risk score decided to go ahead and prescribe Metformin.  Rx advisement given.  Eat low sugar diet.  Follow-up in 3 months or as needed.

## 2020-09-18 ENCOUNTER — Ambulatory Visit (INDEPENDENT_AMBULATORY_CARE_PROVIDER_SITE_OTHER): Payer: BC Managed Care – PPO | Admitting: Primary Care

## 2020-09-18 ENCOUNTER — Encounter: Payer: Self-pay | Admitting: Primary Care

## 2020-09-18 ENCOUNTER — Other Ambulatory Visit: Payer: Self-pay

## 2020-09-18 ENCOUNTER — Telehealth: Payer: Self-pay

## 2020-09-18 VITALS — BP 136/84 | HR 77 | Temp 98.3°F | Ht 72.0 in | Wt 213.0 lb

## 2020-09-18 DIAGNOSIS — J432 Centrilobular emphysema: Secondary | ICD-10-CM | POA: Diagnosis not present

## 2020-09-18 DIAGNOSIS — G4733 Obstructive sleep apnea (adult) (pediatric): Secondary | ICD-10-CM | POA: Diagnosis not present

## 2020-09-18 NOTE — Patient Instructions (Addendum)
Nice to meet you both today  Recommendations - Hold off on starting Symbicort.  Use albuterol 2 puffs every 4-6 hours for breakthrough shortness of breath or wheezing - Take mucinex 600mg  twice a day as needed for chest congestion  - Continue to taper the amount you smoke and pick a quit date on calendar. Use quit Smart resources - You are welcome to make an appointment with one of our pharmacist to discuss smoking cessation in more detail - Continue to stay active maintain healthy weight  Orders - Pulmonary function testing re: emphysema  Follow-up: - 4-6 weeks with pulmonary function testing      Chronic Obstructive Pulmonary Disease  Chronic obstructive pulmonary disease (COPD) is a long-term (chronic) lung problem. When you have COPD, it is hard for air to get in and out of your lungs. Usually the condition gets worse over time, and your lungs will never return to normal. There are things you can do to keep yourself as healthy as possible. What are the causes?  Smoking. This is the most common cause.  Certain genes passed from parent to child (inherited). What increases the risk?  Being exposed to secondhand smoke from cigarettes, pipes, or cigars.  Being exposed to chemicals and other irritants, such as fumes and dust in the work environment.  Having chronic lung conditions or infections. What are the signs or symptoms?  Shortness of breath, especially during physical activity.  A long-term cough with a large amount of thick mucus. Sometimes, the cough may not have any mucus (dry cough).  Wheezing.  Breathing quickly.  Skin that looks gray or blue, especially in the fingers, toes, or lips.  Feeling tired (fatigue).  Weight loss.  Chest tightness.  Having infections often.  Episodes when breathing symptoms become much worse (exacerbations). At the later stages of this disease, you may have swelling in the ankles, feet, or legs. How is this  treated?  Taking medicines.  Quitting smoking, if you smoke.  Rehabilitation. This includes steps to make your body work better. It may involve a team of specialists.  Doing exercises.  Making changes to your diet.  Using oxygen.  Lung surgery.  Lung transplant.  Comfort measures (palliative care). Follow these instructions at home: Medicines  Take over-the-counter and prescription medicines only as told by your doctor.  Talk to your doctor before taking any cough or allergy medicines. You may need to avoid medicines that cause your lungs to be dry. Lifestyle  If you smoke, stop smoking. Smoking makes the problem worse.  Do not smoke or use any products that contain nicotine or tobacco. If you need help quitting, ask your doctor.  Avoid being around things that make your breathing worse. This may include smoke, chemicals, and fumes.  Stay active, but remember to rest as well.  Learn and use tips on how to manage stress and control your breathing.  Make sure you get enough sleep. Most adults need at least 7 hours of sleep every night.  Eat healthy foods. Eat smaller meals more often. Rest before meals. Controlled breathing Learn and use tips on how to control your breathing as told by your doctor. Try:  Breathing in (inhaling) through your nose for 1 second. Then, pucker your lips and breath out (exhale) through your lips for 2 seconds.  Putting one hand on your belly (abdomen). Breathe in slowly through your nose for 1 second. Your hand on your belly should move out. Pucker your lips and breathe out  slowly through your lips. Your hand on your belly should move in as you breathe out.   Controlled coughing Learn and use controlled coughing to clear mucus from your lungs. Follow these steps: 1. Lean your head a little forward. 2. Breathe in deeply. 3. Try to hold your breath for 3 seconds. 4. Keep your mouth slightly open while coughing 2 times. 5. Spit any mucus out  into a tissue. 6. Rest and do the steps again 1 or 2 times as needed. General instructions  Make sure you get all the shots (vaccines) that your doctor recommends. Ask your doctor about a flu shot and a pneumonia shot.  Use oxygen therapy and pulmonary rehabilitation if told by your doctor. If you need home oxygen therapy, ask your doctor if you should buy a tool to measure your oxygen level (oximeter).  Make a COPD action plan with your doctor. This helps you to know what to do if you feel worse than usual.  Manage any other conditions you have as told by your doctor.  Avoid going outside when it is very hot, cold, or humid.  Avoid people who have a sickness you can catch (contagious).  Keep all follow-up visits. Contact a doctor if:  You cough up more mucus than usual.  There is a change in the color or thickness of the mucus.  It is harder to breathe than usual.  Your breathing is faster than usual.  You have trouble sleeping.  You need to use your medicines more often than usual.  You have trouble doing your normal activities such as getting dressed or walking around the house. Get help right away if:  You have shortness of breath while resting.  You have shortness of breath that stops you from: ? Being able to talk. ? Doing normal activities.  Your chest hurts for longer than 5 minutes.  Your skin color is more blue than usual.  Your pulse oximeter shows that you have low oxygen for longer than 5 minutes.  You have a fever.  You feel too tired to breathe normally. These symptoms may represent a serious problem that is an emergency. Do not wait to see if the symptoms will go away. Get medical help right away. Call your local emergency services (911 in the [place] Do not drive yourself to the hospital. Summary  Chronic obstructive pulmonary disease (COPD) is a long-term lung problem.  The way your lungs work will never return to normal. Usually the condition  gets worse over time. There are things you can do to keep yourself as healthy as possible.  Take over-the-counter and prescription medicines only as told by your doctor.  If you smoke, stop. Smoking makes the problem worse. This information is not intended to replace advice given to you by your health care provider. Make sure you discuss any questions you have with your health care provider. Document Revised: 05/15/2020 Document Reviewed: 05/15/2020 Elsevier Patient Education  2021 [employer].

## 2020-09-18 NOTE — Telephone Encounter (Signed)
Left voicemail for patient to bring SD card from CPAP machine to visit today. Nothing further needed.

## 2020-09-18 NOTE — Assessment & Plan Note (Addendum)
-   Current heavy smoker. LDCT showed mild changes paraseptal and centrilobular emphysema. He has a morning cough and mild exertional dyspnea that recovers with rest. No formal PFTs on file. Given RX for Symbicort 160 from PCP which he has not started. Recommend getting full pulmonary function testing to assess for degree of obstruction. Recommend using Albuterol hfa q 6 hours prn shortness of breath. We will address needs for maintenance inhaler after testing. Needs Prevnar 13 vaccine and alpha-1 testing at follow-up. Continue to encourage physical activity and smoking cessation.

## 2020-09-18 NOTE — Progress Notes (Signed)
@Patient  ID: Timothy Hall, male    DOB: 12-Nov-1955, 65 y.o.   MRN: [id number]  Chief Complaint  Patient presents with  . Follow-up    Productive cough in the morning.     Referring provider: Elise Benne  HPI: 65 year old male, current every day smoker (80-pack-year history).  Past medical history significant for hypertension, sleep apnea.  Patient of Dr. Elsworth Soho, seen for initial sleep consult on 09/17/2018.  He was recently seen for lung cancer screening in January 2022 by Barbaraann Barthel, NP.   09/18/2020 Patient presents today to discuss COPD management, referred by Mackie Pai, PA. Accompanied by his wife, he is hard of hearing. They would like to discuss emphysema that was seen on recent LDCT chest.  He is doing well. No formal pulmonary function testing on file. He was sent in prescription for Symbicort but has not started this. He has no shortness of breath. He has a morning productive cough which clears throughout the day. His wife has noticed labored breathing with exertion, especially walking up a hills. He recovers with rest. He is still smoking 1.25 packs daily. At his max he was smoking 2ppd. He is trying to cut back, His wife is supportive of him quitting. He once was an avid runner and would like to get back into jogging.   Low-dose CT on 08/21/2020 mild changes of paraseptal and centrilobular emphysema.  Single small nodule within posterior right upper lobe measuring 1.9 mm. He was noted to have aortic arthrosclerosis and coronary artery calcifications on imaging. Scan read as Lung RADS 2, recommending follow-up in 12 months. Cholesterol medication has been adjusted by PCP.  Denies f/c/s, shortness of breath, chest tightness or wheezing.     Allergies  Allergen Reactions  . Nicoderm [Nicotine] Rash    Immunization History  Administered Date(s) Administered  . Influenza Split 07/17/2020  . Influenza Whole 07/11/2013  . Influenza, Seasonal, Injecte, Preservative  Fre 03/31/2014, 06/08/2015  . Influenza,inj,Quad PF,6+ Mos 03/31/2014, 06/08/2015, 04/11/2016, 08/21/2017, 05/06/2019, 05/07/2020  . Pneumococcal Polysaccharide-23 07/23/2013  . Tdap 12/07/2015, 09/17/2018    Past Medical History:  Diagnosis Date  . Cancer ([place])   . Complication of anesthesia    slow to awaken  . Gastric neoplasm    Cystic, 18 cm - recently diagnosed October 2014   . GERD (gastroesophageal reflux disease)   . Heart murmur age 65  . History of alcoholism ([place])    Quit drinking in 1985  . Pneumonia age 9   hx of  . Rash    upper back due to gleevac  . Sleep apnea    Intolerant of CPAP    Tobacco History: Social History   Tobacco Use  Smoking Status Current Every Day Smoker  . Packs/day: 2.00  . Years: 40.00  . Pack years: 80.00  . Types: Cigarettes  Smokeless Tobacco Never Used  Tobacco Comment   a pack and a quarter.    Ready to quit: Not Answered Counseling given: Not Answered Comment: a pack and a quarter.    Outpatient Medications Prior to Visit  Medication Sig Dispense Refill  . amLODipine (NORVASC) 10 MG tablet Take 1 tablet (10 mg total) by mouth daily. 90 tablet 3  . atorvastatin (LIPITOR) 20 MG tablet Take 1 tablet (20 mg total) by mouth daily. 90 tablet 3  . budesonide-formoterol (SYMBICORT) 160-4.5 MCG/ACT inhaler Inhale 2 puffs into the lungs 2 (two) times daily. 1 each 3  . buPROPion (WELLBUTRIN XL) 150 MG  24 hr tablet Take 1 tablet (150 mg total) by mouth daily. 30 tablet 3  . co-enzyme Q-10 50 MG capsule Take 50 mg by mouth daily.    [name] docusate sodium (COLACE) 100 MG capsule Take 100 mg by mouth daily as needed for mild constipation.    . metFORMIN (GLUCOPHAGE) 500 MG tablet Take 1 tablet (500 mg total) by mouth 2 (two) times daily with a meal. 60 tablet 3  . pantoprazole (PROTONIX) 20 MG tablet Take 1 tablet (20 mg total) by mouth 2 (two) times daily. 60 tablet 5  . polyethylene glycol powder (GLYCOLAX/MIRALAX) 17 GM/SCOOP powder Take  17 g by mouth daily as needed.    . thiamine (VITAMIN B-1) 100 MG tablet Take 100 mg by mouth daily.    . vitamin B-12 (CYANOCOBALAMIN) 1000 MCG tablet Take 1,000 mcg by mouth daily.     No facility-administered medications prior to visit.    Review of Systems  Review of Systems  Constitutional: Negative.   HENT: Negative.   Respiratory: Positive for cough. Negative for chest tightness, shortness of breath and wheezing.        Exertional dyspnea with incline   Cardiovascular: Negative.     Physical Exam  BP 136/84 (BP Location: Left Arm, Cuff Size: Normal)   Pulse 77   Temp 98.3 F (36.8 C)   Ht 6' (1.829 m)   Wt 213 lb (96.6 kg)   SpO2 96%   BMI 28.89 kg/m  Physical Exam Constitutional:      Appearance: Normal appearance.  HENT:     Head: Normocephalic and atraumatic.     Mouth/Throat:     Comments: Deferred d/t masking Cardiovascular:     Rate and Rhythm: Normal rate and regular rhythm.  Pulmonary:     Effort: Pulmonary effort is normal.     Breath sounds: No wheezing, rhonchi or rales.     Comments: CTA Musculoskeletal:        General: Normal range of motion.  Skin:    General: Skin is warm and dry.  Neurological:     General: No focal deficit present.     Mental Status: He is alert and oriented to person, place, and time. Mental status is at baseline.  Psychiatric:        Mood and Affect: Mood normal.        Behavior: Behavior normal.        Thought Content: Thought content normal.      Lab Results:  CBC    Component Value Date/Time   WBC 12.4 (H) 08/20/2020 0853   RBC 5.02 08/20/2020 0853   HGB 14.9 08/20/2020 0853   HGB 13.3 08/04/2016 1223   HCT 46.0 08/20/2020 0853   HCT 39.9 08/04/2016 1223   PLT 316.0 08/20/2020 0853   PLT 292 08/04/2016 1223   MCV 91.6 08/20/2020 0853   MCV 91.9 08/04/2016 1223   MCH 30.0 01/04/2019 0849   MCHC 32.4 08/20/2020 0853   RDW 13.9 08/20/2020 0853   RDW 14.2 08/04/2016 1223   LYMPHSABS 3.0 08/20/2020 0853    LYMPHSABS 2.0 08/04/2016 1223   MONOABS 0.9 08/20/2020 0853   MONOABS 0.6 08/04/2016 1223   EOSABS 0.3 08/20/2020 0853   EOSABS 0.3 08/04/2016 1223   BASOSABS 0.1 08/20/2020 0853   BASOSABS 0.0 08/04/2016 1223    BMET    Component Value Date/Time   NA 140 08/20/2020 0853   NA 141 08/04/2016 1224   K 5.2 (H) 08/20/2020 1025  K 3.9 08/04/2016 1224   CL 105 08/20/2020 0853   CO2 28 08/20/2020 0853   CO2 25 08/04/2016 1224   GLUCOSE 109 (H) 08/20/2020 0853   GLUCOSE 120 08/04/2016 1224   BUN 22 08/20/2020 0853   BUN 15.9 08/04/2016 1224   CREATININE 1.12 08/20/2020 0853   CREATININE 1.1 08/04/2016 1224   CALCIUM 10.2 08/20/2020 0853   CALCIUM 9.4 08/04/2016 1224   GFRNONAA >60 01/04/2019 0849   GFRAA >60 01/04/2019 0849    BNP No results found for: BNP  ProBNP No results found for: PROBNP  Imaging: CT CHEST LUNG CA SCREEN LOW DOSE W/O CM  Result Date: 08/21/2020 CLINICAL DATA:  Lung cancer screening. Current asymptomatic smoker. Seventy pack-year history. EXAM: CT CHEST WITHOUT CONTRAST LOW-DOSE FOR LUNG CANCER SCREENING TECHNIQUE: Multidetector CT imaging of the chest was performed following the standard protocol without IV contrast. COMPARISON:  CT angio chest from 05/13/2013 FINDINGS: Cardiovascular: The heart size is normal. No pericardial effusion. Aortic atherosclerosis. Coronary artery calcifications. Mediastinum/Nodes: Normal appearance of the thyroid gland. The trachea appears patent and is midline. Normal appearance of the esophagus. No enlarged lymph nodes. Lungs/Pleura: Mild changes of paraseptal and centrilobular emphysema. No pleural effusion identified. No airspace consolidation, atelectasis, or pneumothorax. Single small nodule within the posterior right upper lobe has an equivalent diameter of 1.9 mm. No suspicious lung nodules identified at this time. Upper Abdomen: No acute abnormality. Musculoskeletal: No chest wall mass or suspicious bone lesions  identified. IMPRESSION: 1. Lung-RADS 2, benign appearance or behavior. Continue annual screening with low-dose chest CT without contrast in 12 months. 2. Coronary artery calcifications. 3. Aortic Atherosclerosis (ICD10-I70.0) and Emphysema (ICD10-J43.9). Aortic Atherosclerosis (ICD10-I70.0). Electronically Signed   By: Kerby Moors M.D.   On: 08/21/2020 08:49     Assessment & Plan:   Centrilobular emphysema ([place]) - Current heavy smoker. LDCT showed mild changes paraseptal and centrilobular emphysema. He has a morning cough and mild exertional dyspnea that recovers with rest. No formal PFTs on file. Given RX for Symbicort 160 from PCP which he has not started. Recommend getting full pulmonary function testing to assess for degree of obstruction. Recommend using Albuterol hfa q 6 hours prn shortness of breath. We will address needs for maintenance inhaler after testing. Needs Prevnar 13 vaccine and alpha-1 testing at follow-up. Continue to encourage physical activity and smoking cessation.   Sleep apnea - Previously intolerant to CPAP d.t pressure setting. Repeat HST in Aprill 2020 was incomplete, no current clinical symptoms. Continue to monitor.    Martyn Ehrich, NP 09/18/2020

## 2020-09-18 NOTE — Assessment & Plan Note (Addendum)
-   Previously intolerant to CPAP d.t pressure setting. Repeat HST in Aprill 2020 was incomplete, no current clinical symptoms. Continue to monitor.

## 2020-09-28 DIAGNOSIS — Z79899 Other long term (current) drug therapy: Secondary | ICD-10-CM | POA: Diagnosis not present

## 2020-09-28 DIAGNOSIS — B351 Tinea unguium: Secondary | ICD-10-CM | POA: Diagnosis not present

## 2020-11-05 ENCOUNTER — Ambulatory Visit (INDEPENDENT_AMBULATORY_CARE_PROVIDER_SITE_OTHER): Payer: BC Managed Care – PPO | Admitting: Pulmonary Disease

## 2020-11-05 ENCOUNTER — Ambulatory Visit: Payer: BC Managed Care – PPO | Admitting: Primary Care

## 2020-11-05 ENCOUNTER — Other Ambulatory Visit: Payer: Self-pay

## 2020-11-05 DIAGNOSIS — J432 Centrilobular emphysema: Secondary | ICD-10-CM

## 2020-11-05 LAB — PULMONARY FUNCTION TEST
DL/VA % pred: 120 %
DL/VA: 4.98 ml/min/mmHg/L
DLCO cor % pred: 83 %
DLCO cor: 23.93 ml/min/mmHg
DLCO unc % pred: 83 %
DLCO unc: 23.93 ml/min/mmHg
FEF 25-75 Post: 3.55 L/sec
FEF 25-75 Pre: 2.9 L/sec
FEF2575-%Change-Post: 22 %
FEF2575-%Pred-Post: 119 %
FEF2575-%Pred-Pre: 97 %
FEV1-%Change-Post: 5 %
FEV1-%Pred-Post: 74 %
FEV1-%Pred-Pre: 70 %
FEV1-Post: 2.79 L
FEV1-Pre: 2.64 L
FEV1FVC-%Change-Post: 3 %
FEV1FVC-%Pred-Pre: 109 %
FEV6-%Change-Post: 2 %
FEV6-%Pred-Post: 69 %
FEV6-%Pred-Pre: 67 %
FEV6-Post: 3.29 L
FEV6-Pre: 3.21 L
FEV6FVC-%Pred-Post: 105 %
FEV6FVC-%Pred-Pre: 105 %
FVC-%Change-Post: 2 %
FVC-%Pred-Post: 66 %
FVC-%Pred-Pre: 64 %
FVC-Post: 3.29 L
FVC-Pre: 3.21 L
Post FEV1/FVC ratio: 85 %
Post FEV6/FVC ratio: 100 %
Pre FEV1/FVC ratio: 82 %
Pre FEV6/FVC Ratio: 100 %
RV % pred: 62 %
RV: 1.52 L
TLC % pred: 66 %
TLC: 4.9 L

## 2020-11-05 NOTE — Progress Notes (Signed)
Full PFT performed today. °

## 2020-11-05 NOTE — Patient Instructions (Signed)
Pulmonary function test performed today. 

## 2020-11-12 ENCOUNTER — Ambulatory Visit: Payer: BC Managed Care – PPO | Admitting: Primary Care

## 2020-11-14 NOTE — Progress Notes (Signed)
Please let patient know PFTs showed moderate restriction with decrease in diffusion capacity. This is likely due to body habitus (weight) and emphysema. No evidence of copd. When I saw him he had no respiratory symptoms.If still smoking needs to quit.

## 2020-11-25 ENCOUNTER — Encounter: Payer: Self-pay | Admitting: *Deleted

## 2021-01-01 ENCOUNTER — Telehealth: Payer: Self-pay | Admitting: Medical

## 2021-01-01 MED ORDER — PANTOPRAZOLE SODIUM 20 MG PO TBEC: 20.0000 mg | DELAYED_RELEASE_TABLET | Freq: Two times a day (BID) | ORAL | 5 refills | Status: DC

## 2021-01-01 NOTE — Telephone Encounter (Signed)
Medication: pantoprazole (PROTONIX) 20 MG tablet [[id number]]      Has the patient contacted their pharmacy? Yes.   (If no, request that the patient contact the pharmacy for the refill.) (If yes, when and what did the pharmacy advise?) pharmacy has fax rx twice     Preferred Pharmacy (with phone number or street name): [facility] [place]  [address], [place] [address]  Phone:  [phone]  Fax:  [phone]      Agent: Please be advised that RX refills may take up to 3 business days. We ask that you follow-up with your pharmacy.

## 2021-01-01 NOTE — Telephone Encounter (Signed)
Rx sent 

## 2021-02-01 ENCOUNTER — Other Ambulatory Visit: Payer: Self-pay

## 2021-02-01 MED ORDER — BUPROPION HCL ER (XL) 150 MG PO TB24: 150.0000 mg | ORAL_TABLET | Freq: Every day | ORAL | 3 refills | Status: DC

## 2021-05-04 ENCOUNTER — Encounter: Payer: Self-pay | Admitting: Oncology

## 2021-05-07 ENCOUNTER — Other Ambulatory Visit: Payer: Self-pay

## 2021-05-07 ENCOUNTER — Inpatient Hospital Stay: Payer: BC Managed Care – PPO | Attending: Oncology | Admitting: Oncology

## 2021-05-07 ENCOUNTER — Other Ambulatory Visit (HOSPITAL_BASED_OUTPATIENT_CLINIC_OR_DEPARTMENT_OTHER): Payer: Self-pay

## 2021-05-07 VITALS — BP 152/73 | HR 103 | Temp 98.1°F | Resp 18 | Ht 72.0 in | Wt 206.8 lb

## 2021-05-07 DIAGNOSIS — R109 Unspecified abdominal pain: Secondary | ICD-10-CM | POA: Insufficient documentation

## 2021-05-07 DIAGNOSIS — F17210 Nicotine dependence, cigarettes, uncomplicated: Secondary | ICD-10-CM | POA: Diagnosis not present

## 2021-05-07 DIAGNOSIS — R1110 Vomiting, unspecified: Secondary | ICD-10-CM | POA: Diagnosis not present

## 2021-05-07 DIAGNOSIS — Z903 Acquired absence of stomach [part of]: Secondary | ICD-10-CM | POA: Insufficient documentation

## 2021-05-07 DIAGNOSIS — C49A2 Gastrointestinal stromal tumor of stomach: Secondary | ICD-10-CM | POA: Insufficient documentation

## 2021-05-07 DIAGNOSIS — R630 Anorexia: Secondary | ICD-10-CM | POA: Insufficient documentation

## 2021-05-07 MED ORDER — INFLUENZA VAC SPLIT QUAD 0.5 ML IM SUSY
PREFILLED_SYRINGE | INTRAMUSCULAR | 0 refills | Status: DC
  Filled 2021-05-07: qty 0.5, 1d supply, fill #0

## 2021-05-07 NOTE — Progress Notes (Signed)
[facility] OFFICE PROGRESS NOTE   Diagnosis: Gastrointestinal stromal tumor  INTERVAL HISTORY:   Mr. Cropper returns as scheduled.  He is here with his wife.  He reports intermittent low abdominal discomfort for the past week.  He has 2 episodes of vomiting over the past month.  He has chronic reflux symptoms that worsen when he does not take pantoprazole.  He reports decreased urinary stream and incomplete emptying.  His appetite has been poor for the past several weeks.  Mr. Lograsso continues to smoke approximately 1/2 pack of cigarettes per day.  He is followed in the lung cancer screening clinic.  Objective:  Vital signs in last 24 hours:  Blood pressure (!) 152/73, pulse (!) 103, temperature 98.1 F (36.7 C), temperature source Oral, resp. rate 18, height 6' (1.829 m), weight 206 lb 12.8 oz (93.8 kg), SpO2 98 %.    Lymphatics: No cervical, supraclavicular, axillary, or inguinal nodes Resp: Lungs clear bilaterally Cardio: Regular rate and rhythm GI: No hepatosplenomegaly, no apparent ascites, no mass, mild tenderness in the left lower abdomen Vascular: No leg edema   Lab Results:  Lab Results  Component Value Date   WBC 12.4 (H) 08/20/2020   HGB 14.9 08/20/2020   HCT 46.0 08/20/2020   MCV 91.6 08/20/2020   PLT 316.0 08/20/2020   NEUTROABS 8.1 (H) 08/20/2020    CMP  Lab Results  Component Value Date   NA 140 08/20/2020   K 5.2 (H) 08/20/2020   CL 105 08/20/2020   CO2 28 08/20/2020   GLUCOSE 109 (H) 08/20/2020   BUN 22 08/20/2020   CREATININE 1.12 08/20/2020   CALCIUM 10.2 08/20/2020   PROT 7.3 08/20/2020   ALBUMIN 4.6 08/20/2020   AST 21 08/20/2020   ALT 44 08/20/2020   ALKPHOS 82 08/20/2020   BILITOT 0.4 08/20/2020   GFRNONAA >60 01/04/2019   GFRAA >60 01/04/2019    Lab Results  Component Value Date   CEA 0.9 05/31/2013     Medications: I have reviewed the patient's current medications.   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 Initiation of adjuvant Gleevec 08/31/2013.   Gleevec dose reduced to 400 mg every other day beginning 10/18/2013 secondary to neutropenia.   Gleevec dose adjusted to 200 mg daily beginning 11/29/2013.   Gleevec dose adjusted to 300 mg daily beginning 12/13/2013. Upper endoscopy 07/12/2014 without evidence of recurrent tumor in the stomach Gleevec completed February 2018 Mild skin rash most likely secondary to [place]. History of Mild periorbital edema secondary to [place]. Severe neutropenia secondary to [facility] 10/12/2013. Resolved. History of Mild lower extremity edema likely related to [place]. Admission 07/18/2014 with a small bowel obstruction-resolved with bowel rest, CT 07/18/2014 without evidence of recurrent tumor. Follow-up CT 02/16/2015 with no evidence of gastric region mass or wall thickening. 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 Status post evaluation in the emergency department for abdominal pain 05/12/2016. CT with no evidence for acute intra-abdominal or pelvic pathology. Sigmoid colon diverticulosis with no definite surrounding bowel inflammation. Slight decreased size of fat density masslike area within the upper pelvis thought to represent omental infarct; scattered peripheral calcifications now present within the lesion. Admission 07/20/2018 with a recurrent small bowel obstruction, CT negative for evidence of recurrent gastrointestinal stromal tumor      Disposition: Mr. Oros has  a remote history of a gastrointestinal stromal tumor of the stomach.  He has developed abdominal discomfort, anorexia, and intermittent vomiting over the past month.  The symptoms may be related to the partial gastrectomy, adhesions, or another etiology.  He appears to have BPH  symptoms.  We discussed options for evaluating his symptoms.  We discussed obtaining a CT abdomen/pelvis now versus a short interval follow-up visit.  He prefers to return for an office visit in 6 weeks.  He will follow-up with his primary provider to evaluate the urinary symptoms.  Mr. Wery will obtain an influenza vaccine today.  Betsy Coder, MD  05/07/2021  1:10 PM

## 2021-06-12 ENCOUNTER — Inpatient Hospital Stay: Payer: BC Managed Care – PPO | Admitting: Oncology

## 2021-06-28 ENCOUNTER — Other Ambulatory Visit: Payer: Self-pay

## 2021-06-28 ENCOUNTER — Inpatient Hospital Stay: Payer: BC Managed Care – PPO | Attending: Oncology | Admitting: Oncology

## 2021-06-28 ENCOUNTER — Encounter: Payer: Self-pay | Admitting: Oncology

## 2021-06-28 VITALS — BP 144/74 | HR 73 | Temp 98.0°F | Resp 20 | Ht 72.0 in | Wt 211.2 lb

## 2021-06-28 DIAGNOSIS — C49A2 Gastrointestinal stromal tumor of stomach: Secondary | ICD-10-CM | POA: Diagnosis not present

## 2021-06-28 DIAGNOSIS — Z79899 Other long term (current) drug therapy: Secondary | ICD-10-CM | POA: Insufficient documentation

## 2021-06-28 NOTE — Progress Notes (Signed)
  [facility] OFFICE PROGRESS NOTE   Diagnosis: Gastrointestinal stromal tumor.  INTERVAL HISTORY:   Mr. Ehly returns as scheduled.  He is here today with his wife.  He no longer has nausea, vomiting, or abdominal pain.  No complaint.  He continues smoking.  Objective:  Vital signs in last 24 hours:  Blood pressure (!) 144/74, pulse 73, temperature 98 F (36.7 C), temperature source Oral, resp. rate 20, height 6' (1.829 m), weight 211 lb 3.2 oz (95.8 kg), SpO2 100 %.     Lymphatics: No cervical, supraclavicular, axillary, or inguinal nodes Resp: Lungs clear bilaterally Cardio: Regular rate and rhythm GI: No mass, nontender, no hepatosplenomegaly Vascular: No leg edema   Lab Results  Component Value Date   WBC 12.4 (H) 08/20/2020   HGB 14.9 08/20/2020   HCT 46.0 08/20/2020   MCV 91.6 08/20/2020   PLT 316.0 08/20/2020   NEUTROABS 8.1 (H) 08/20/2020    CMP  Lab Results  Component Value Date   NA 140 08/20/2020   K 5.2 (H) 08/20/2020   CL 105 08/20/2020   CO2 28 08/20/2020   GLUCOSE 109 (H) 08/20/2020   BUN 22 08/20/2020   CREATININE 1.12 08/20/2020   CALCIUM 10.2 08/20/2020   PROT 7.3 08/20/2020   ALBUMIN 4.6 08/20/2020   AST 21 08/20/2020   ALT 44 08/20/2020   ALKPHOS 82 08/20/2020   BILITOT 0.4 08/20/2020   GFRNONAA >60 01/04/2019   GFRAA >60 01/04/2019     Medications: I have reviewed the patient's current medications.   Assessment/Plan:  Gastrointestinal stromal tumor stage II (T4 NX), most likely arising from the stomach, status post surgical resection including a partial gastrectomy on 07/19/2013, 19 cm cystic mass with a low mitotic rate. Initiation of adjuvant Gleevec 08/31/2013.   Gleevec dose reduced to 400 mg every other day beginning 10/18/2013 secondary to neutropenia.   Gleevec dose adjusted to 200 mg daily beginning 11/29/2013.   Gleevec dose adjusted to 300 mg daily beginning 12/13/2013. Upper endoscopy 07/12/2014 without  evidence of recurrent tumor in the stomach Gleevec completed February 2018 Mild skin rash most likely secondary to [place]. History of Mild periorbital edema secondary to [facility]. Severe neutropenia secondary to [facility] 10/12/2013. Resolved. History of Mild lower extremity edema likely related to [place]. Admission 07/18/2014 with a small bowel obstruction-resolved with bowel rest, CT 07/18/2014 without evidence of recurrent tumor. Follow-up CT 02/16/2015 with no evidence of gastric region mass or wall thickening. 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 Status post evaluation in the emergency department for abdominal pain 05/12/2016. CT with no evidence for acute intra-abdominal or pelvic pathology. Sigmoid colon diverticulosis with no definite surrounding bowel inflammation. Slight decreased size of fat density masslike area within the upper pelvis thought to represent omental infarct; scattered peripheral calcifications now present within the lesion. Admission 07/20/2018 with a recurrent small bowel obstruction, CT negative for evidence of recurrent gastrointestinal stromal tumor     Disposition: Mr. Dykema has an improved performance status compared to when he was here in October.  There is no clinical evidence for progression of the gastrointestinal stromal tumor.  He will return for an office visit in 1 year.  He will call in the interim for new symptoms.  He is followed in the lung cancer screening clinic.  Betsy Coder, MD  06/28/2021  8:13 AM

## 2021-07-12 ENCOUNTER — Telehealth: Payer: Self-pay | Admitting: Medical

## 2021-07-12 MED ORDER — MOLNUPIRAVIR EUA 200MG CAPSULE: 4.0000 | ORAL_CAPSULE | Freq: Two times a day (BID) | ORAL | 0 refills | Status: AC

## 2021-07-12 MED ORDER — FLUTICASONE PROPIONATE 50 MCG/ACT NA SUSP: 2.0000 | Freq: Every day | NASAL | 1 refills | Status: DC

## 2021-07-12 MED ORDER — BENZONATATE 100 MG PO CAPS: 100.0000 mg | ORAL_CAPSULE | Freq: Three times a day (TID) | ORAL | 0 refills | Status: DC | PRN

## 2021-07-12 MED ORDER — BUPROPION HCL ER (XL) 150 MG PO TB24: 150.0000 mg | ORAL_TABLET | Freq: Every day | ORAL | 3 refills | Status: DC

## 2021-07-12 NOTE — Telephone Encounter (Signed)
Chart opened to rx med, order lab, review chart, respond to my chart message or send message to staff member  

## 2021-07-12 NOTE — Telephone Encounter (Signed)
Pt wife called and stated she just completed a VV with Mickel Baas and she is covid positive. Mickel Baas wanted her to inform you since he has been around wife. Pt isn't displaying any symptoms.

## 2021-07-12 NOTE — Telephone Encounter (Signed)
Opened to review and rx meds for covid infection.  Mackie Pai, PA-C

## 2021-07-12 NOTE — Telephone Encounter (Signed)
COVID infection and patient who is not vaccinated.  He has presently mild headache.  No other symptoms.  His wife tested positive for COVID and she tested him today before called them.   Schedule was for today and office about to close.  I went ahead and sent in molnupiravir antiviral.  Note patient is on amlodipine and Crestor so prescribed this antiviral.  Explained to start medication today.  Also making benzonatate available for cough and Flonase available in the event of nasal congestion.   Explained as we approach on weekend that if patient worsens as discussed then be seen in the emergency department.  Some concern since has never had COVID-vaccine.  Check O2 sats daily.  Would like to see O2 saturation above 96%.

## 2021-08-01 ENCOUNTER — Other Ambulatory Visit: Payer: Self-pay | Admitting: Medical

## 2021-08-12 ENCOUNTER — Other Ambulatory Visit: Payer: Self-pay | Admitting: Medical

## 2021-08-26 ENCOUNTER — Telehealth: Payer: Self-pay | Admitting: Medical

## 2021-08-26 ENCOUNTER — Other Ambulatory Visit: Payer: Self-pay | Admitting: Acute Care

## 2021-08-26 DIAGNOSIS — F17210 Nicotine dependence, cigarettes, uncomplicated: Secondary | ICD-10-CM

## 2021-08-26 MED ORDER — AMLODIPINE BESYLATE 10 MG PO TABS: 10.0000 mg | ORAL_TABLET | Freq: Every day | ORAL | 3 refills | Status: DC

## 2021-08-26 MED ORDER — PANTOPRAZOLE SODIUM 20 MG PO TBEC: DELAYED_RELEASE_TABLET | ORAL | 0 refills | Status: DC

## 2021-08-26 MED ORDER — METFORMIN HCL 500 MG PO TABS: ORAL_TABLET | ORAL | 0 refills | Status: DC

## 2021-08-26 MED ORDER — ATORVASTATIN CALCIUM 20 MG PO TABS: 20.0000 mg | ORAL_TABLET | Freq: Every day | ORAL | 3 refills | Status: DC

## 2021-08-26 MED ORDER — BUPROPION HCL ER (XL) 150 MG PO TB24: 150.0000 mg | ORAL_TABLET | Freq: Every day | ORAL | 3 refills | Status: DC

## 2021-08-26 NOTE — Telephone Encounter (Signed)
Rx sent to pharmacy   

## 2021-08-26 NOTE — Telephone Encounter (Signed)
Patient is scheduled for CPE on 10/30/21  Wife states he will need medication before then do we need an appt before April for that?

## 2021-09-10 ENCOUNTER — Other Ambulatory Visit: Payer: Self-pay

## 2021-09-10 ENCOUNTER — Ambulatory Visit (HOSPITAL_BASED_OUTPATIENT_CLINIC_OR_DEPARTMENT_OTHER)
Admission: RE | Admit: 2021-09-10 | Discharge: 2021-09-10 | Disposition: A | Discharge status: Home / Self Care | Payer: Medicare Other | Source: Ambulatory Visit | Attending: Acute Care | Admitting: Acute Care

## 2021-09-10 ENCOUNTER — Encounter: Payer: Self-pay | Admitting: Medical

## 2021-09-10 ENCOUNTER — Ambulatory Visit (INDEPENDENT_AMBULATORY_CARE_PROVIDER_SITE_OTHER): Payer: Medicare Other | Admitting: Medical

## 2021-09-10 VITALS — BP 140/70 | HR 98 | Resp 18 | Ht 73.0 in | Wt 216.0 lb

## 2021-09-10 DIAGNOSIS — I10 Essential (primary) hypertension: Secondary | ICD-10-CM

## 2021-09-10 DIAGNOSIS — J432 Centrilobular emphysema: Secondary | ICD-10-CM | POA: Diagnosis not present

## 2021-09-10 DIAGNOSIS — Z0000 Encounter for general adult medical examination without abnormal findings: Secondary | ICD-10-CM | POA: Diagnosis not present

## 2021-09-10 DIAGNOSIS — H9193 Unspecified hearing loss, bilateral: Secondary | ICD-10-CM

## 2021-09-10 DIAGNOSIS — R350 Frequency of micturition: Secondary | ICD-10-CM

## 2021-09-10 DIAGNOSIS — F17210 Nicotine dependence, cigarettes, uncomplicated: Secondary | ICD-10-CM | POA: Insufficient documentation

## 2021-09-10 DIAGNOSIS — E785 Hyperlipidemia, unspecified: Secondary | ICD-10-CM | POA: Diagnosis not present

## 2021-09-10 DIAGNOSIS — C49A2 Gastrointestinal stromal tumor of stomach: Secondary | ICD-10-CM

## 2021-09-10 DIAGNOSIS — Z23 Encounter for immunization: Secondary | ICD-10-CM

## 2021-09-10 DIAGNOSIS — Z1159 Encounter for screening for other viral diseases: Secondary | ICD-10-CM

## 2021-09-10 NOTE — Progress Notes (Signed)
Subjective:    Patient ID: [name], male    DOB: 07-15-1956, 66 y.o.   MRN: [id number]  HPI   Pt in for welcome to medicare exam. Recently retired.     Subjective:   DONNIS PHANEUF is a 66 y.o. male who presents for a Welcome to Medicare exam.   Review of Systems: See below.       Objective:    Today's Vitals   09/10/21 1002  BP: (!) 160/66  Pulse: 98  Resp: 18  SpO2: 97%  Weight: 216 lb (98 kg)  Height: 6\' 1"  (1.854 m)   Bp 140/70 on recheck. Body mass index is 28.5 kg/m.  Medications Outpatient Encounter Medications as of 09/10/2021  Medication Sig   amLODipine (NORVASC) 10 MG tablet Take 1 tablet (10 mg total) by mouth daily.   atorvastatin (LIPITOR) 20 MG tablet Take 1 tablet (20 mg total) by mouth daily.   benzonatate (TESSALON) 100 MG capsule Take 1 capsule (100 mg total) by mouth 3 (three) times daily as needed for cough.   buPROPion (WELLBUTRIN XL) 150 MG 24 hr tablet Take 1 tablet (150 mg total) by mouth daily.   co-enzyme Q-10 50 MG capsule Take 50 mg by mouth daily.   docusate sodium (COLACE) 100 MG capsule Take 100 mg by mouth daily as needed for mild constipation.   fluticasone (FLONASE) 50 MCG/ACT nasal spray Place 2 sprays into both nostrils daily.   influenza vac split quadrivalent PF (FLUARIX) 0.5 ML injection Inject into the muscle.   metFORMIN (GLUCOPHAGE) 500 MG tablet TAKE 1 TABLET BY MOUTH TWICE A DAY WITH A MEAL.   pantoprazole (PROTONIX) 20 MG tablet TAKE (1) TABLET BY MOUTH TWICE DAILY.   polyethylene glycol powder (GLYCOLAX/MIRALAX) 17 GM/SCOOP powder Take 17 g by mouth daily as needed.   thiamine (VITAMIN B-1) 100 MG tablet Take 100 mg by mouth daily.   vitamin B-12 (CYANOCOBALAMIN) 1000 MCG tablet Take 1,000 mcg by mouth daily.   No facility-administered encounter medications on file as of 09/10/2021.     History: Past Medical History:  Diagnosis Date   Cancer ([place])    Complication of anesthesia    slow to awaken    Gastric neoplasm    Cystic, 18 cm - recently diagnosed October 2014    GERD (gastroesophageal reflux disease)    Heart murmur age 27   History of alcoholism ([place])    Quit drinking in 1985   Pneumonia age 7   hx of   Rash    upper back due to gleevac   Sleep apnea    Intolerant of CPAP   Past Surgical History:  Procedure Laterality Date   breast tumor removed Right 1976   COLONOSCOPY N/A 11/20/2014   Procedure: COLONOSCOPY;  Surgeon: [name], MD;  [facility];  Service: Endoscopy;  Laterality: N/A;  1030 - moved to 5/2 @ 10:30   EUS N/A 05/25/2013   Procedure: ESOPHAGEAL ENDOSCOPIC ULTRASOUND (EUS) RADIAL;  Surgeon: [name], MD;  [facility];  Service: Endoscopy;  Laterality: N/A;   EUS N/A 07/12/2014   EGD by Dr. [name]: small hh, widely patent Schatzki's ring. evidence of prior small gastric wedge resection along body of stomach. no evidence of recurrent tumor.    FINE NEEDLE ASPIRATION N/A 05/25/2013   Procedure: FINE NEEDLE ASPIRATION (FNA) LINEAR;  Surgeon: [name], MD;  [facility];  Service: Endoscopy;  Laterality: N/A;"this part never happend"  LAPAROSCOPIC GASTRECTOMY N/A 07/19/2013   Procedure: RESECTION OF RETROGASTRIC CYSTIC NEOPLASM WITH PARTIAL GASTRECTOMY;  Surgeon: [name], MD;  [facility];  Service: General;  Laterality: N/A;   lipoma removed from back  yrs ago   THYROIDECTOMY Left 01/07/2019   Procedure: Left thyroid lobectomy, with frozen section;  Surgeon: [name], MD;  Location: [facility];  Service: ENT;  Laterality: Left;   TUMOR REMOVAL     Benign tumor removed from left breast and back area.     Family History  Problem Relation Age of Onset   Breast cancer Mother    CAD Brother        Premature disease   Colon cancer Neg Hx    Stomach cancer Neg Hx    Social History   Occupational History   Occupation: Heritage manager: [employer]  Tobacco Use   Smoking  status: Every Day    Packs/day: 2.00    Years: 40.00    Pack years: 80.00    Types: Cigarettes   Smokeless tobacco: Never   Tobacco comments:    a pack and a quarter.   Substance and Sexual Activity   Alcohol use: No    Comment: quit in 1985   Drug use: No    Comment: History of alcoholism   Sexual activity: Yes   Tobacco Counseling Yes and tapering off. Tried wellbutrin I past.  Immunizations and Health Maintenance Immunization History  Administered Date(s) Administered   Influenza Split 07/17/2020   Influenza Whole 07/11/2013   Influenza, Seasonal, Injecte, Preservative Fre 03/31/2014, 06/08/2015   Influenza,inj,Quad PF,6+ Mos 03/31/2014, 06/08/2015, 04/11/2016, 08/21/2017, 05/06/2019, 05/07/2020   Pneumococcal Polysaccharide-23 07/23/2013   Tdap 12/07/2015, 09/17/2018   Health Maintenance Due  Topic Date Due   COVID-19 Vaccine (1) Never done   Hepatitis C Screening  Never done   Zoster Vaccines- Shingrix (1 of 2) Never done   Pneumonia Vaccine 79+ Years old (2 - PCV) 07/23/2014   INFLUENZA VACCINE  02/18/2021    Activities of Daily Living Fully functional  Physical Exam  (optional), or other factors deemed appropriate based on the beneficiary's medical and social history and current clinical standards.  Advanced Directives: Pt want to be DNR.  Assessment:    This is a routine wellness  examination for this patient .   Vision/Hearing screen Vision Screening   Right eye Left eye Both eyes  Without correction     With correction 20/25 20/25 20/25     Dietary issues and exercise activities discussed:  Low cholesterol and low sugar diet. Advise to walk daily. Goal 6000 steps a day.    Depression Screen Phq-9 2.    Fall Risk No falls.   Cognitive Function Normal.  Patient Care Team: Lezli Danek, Iris Pert as PCP - General (Internal Medicine) Satira Sark, MD as Consulting Physician (Cardiology) Gala Romney Cristopher Estimable, MD as Consulting Physician  (Gastroenterology)     Plan:  Discussed smoking cessation.   I have personally reviewed and noted the following in the patients chart:   Medical and social history Use of alcohol, tobacco or illicit drugs (pt smoking 13-14 cigaretes a week. Current medications and supplements Functional ability and status. Functional well. Nutritional status. Physical activity Advanced directives. Signed dnr.  List of other physicians.Eric Form NP pulmonologist. Leodis Rains MD oncologist. Hospitalizations, surgeries, and ER visits in previous 12 months Vitals Screenings to include cognitive, depression, and falls.  Referrals and appointments  In addition, I  have reviewed and discussed with patient certain preventive protocols, quality metrics, and best practice recommendations. A written personalized care plan for preventive services as well as general preventive health recommendations were provided to patient.    Mackie Pai, PA-C 09/10/2021       Past Medical History:  Diagnosis Date   Cancer ([place])    Complication of anesthesia    slow to awaken   Gastric neoplasm    Cystic, 18 cm - recently diagnosed October 2014    GERD (gastroesophageal reflux disease)    Heart murmur age 73   History of alcoholism ([place])    Quit drinking in 1985   Pneumonia age 18   hx of   Rash    upper back due to gleevac   Sleep apnea    Intolerant of CPAP     Social History   Socioeconomic History   Marital status: Married    Spouse name: Not on file   Number of children: Not on file   Years of education: Not on file   Highest education level: Not on file  Occupational History   Occupation: [place]    Employer: [employer]  Tobacco Use   Smoking status: Every Day    Packs/day: 2.00    Years: 40.00    Pack years: 80.00    Types: Cigarettes   Smokeless tobacco: Never   Tobacco comments:    a pack and a quarter.   Substance and Sexual Activity   Alcohol use: No     Comment: quit in 1985   Drug use: No    Comment: History of alcoholism   Sexual activity: Yes  Other Topics Concern   Not on file  Social History Narrative   Married, wife [name]   Has #1 grown daughter and a grandson   Has a dog he is close to   Enjoys working on cars and fishing   Social Determinants of Radio broadcast assistant Strain: Not on file  Food Insecurity: Not on file  Transportation Needs: Not on file  Physical Activity: Not on file  Stress: Not on file  Social Connections: Not on file  Intimate Partner Violence: Not on file    Past Surgical History:  Procedure Laterality Date   breast tumor removed Right 1976   COLONOSCOPY N/A 11/20/2014   Procedure: COLONOSCOPY;  Surgeon: [name], MD;  [facility];  Service: Endoscopy;  Laterality: N/A;  1030 - moved to 5/2 @ 10:30   EUS N/A 05/25/2013   Procedure: ESOPHAGEAL ENDOSCOPIC ULTRASOUND (EUS) RADIAL;  Surgeon: [name], MD;  [facility];  Service: Endoscopy;  Laterality: N/A;   EUS N/A 07/12/2014   EGD by Dr. [name]: small hh, widely patent Schatzki's ring. evidence of prior small gastric wedge resection along body of stomach. no evidence of recurrent tumor.    FINE NEEDLE ASPIRATION N/A 05/25/2013   Procedure: FINE NEEDLE ASPIRATION (FNA) LINEAR;  Surgeon: [name], MD;  [facility];  Service: Endoscopy;  Laterality: N/A;"this part never happend"   LAPAROSCOPIC GASTRECTOMY N/A 07/19/2013   Procedure: RESECTION OF RETROGASTRIC CYSTIC NEOPLASM WITH PARTIAL GASTRECTOMY;  Surgeon: [name], MD;  [facility];  Service: General;  Laterality: N/A;   lipoma removed from back  yrs ago   THYROIDECTOMY Left 01/07/2019   Procedure: Left thyroid lobectomy, with frozen section;  Surgeon: [name], MD;  Location: [facility];  Service: ENT;  Laterality: Left;   TUMOR  REMOVAL     Benign tumor removed from left breast and back area.     Family History  Problem Relation  Age of Onset   Breast cancer Mother    CAD Brother        Premature disease   Colon cancer Neg Hx    Stomach cancer Neg Hx     Allergies  Allergen Reactions   Nicoderm [Nicotine] Rash    Current Outpatient Medications on File Prior to Visit  Medication Sig Dispense Refill   amLODipine (NORVASC) 10 MG tablet Take 1 tablet (10 mg total) by mouth daily. 90 tablet 3   atorvastatin (LIPITOR) 20 MG tablet Take 1 tablet (20 mg total) by mouth daily. 90 tablet 3   benzonatate (TESSALON) 100 MG capsule Take 1 capsule (100 mg total) by mouth 3 (three) times daily as needed for cough. 30 capsule 0   buPROPion (WELLBUTRIN XL) 150 MG 24 hr tablet Take 1 tablet (150 mg total) by mouth daily. 30 tablet 3   co-enzyme Q-10 50 MG capsule Take 50 mg by mouth daily.     docusate sodium (COLACE) 100 MG capsule Take 100 mg by mouth daily as needed for mild constipation.     fluticasone (FLONASE) 50 MCG/ACT nasal spray Place 2 sprays into both nostrils daily. 16 g 1   influenza vac split quadrivalent PF (FLUARIX) 0.5 ML injection Inject into the muscle. 0.5 mL 0   metFORMIN (GLUCOPHAGE) 500 MG tablet TAKE 1 TABLET BY MOUTH TWICE A DAY WITH A MEAL. 60 tablet 0   pantoprazole (PROTONIX) 20 MG tablet TAKE (1) TABLET BY MOUTH TWICE DAILY. 60 tablet 0   polyethylene glycol powder (GLYCOLAX/MIRALAX) 17 GM/SCOOP powder Take 17 g by mouth daily as needed.     thiamine (VITAMIN B-1) 100 MG tablet Take 100 mg by mouth daily.     vitamin B-12 (CYANOCOBALAMIN) 1000 MCG tablet Take 1,000 mcg by mouth daily.     No current facility-administered medications on file prior to visit.    BP (!) 160/66    Pulse 98    Resp 18    Ht 6\' 1"  (1.854 m)    Wt 216 lb (98 kg)    SpO2 97%    BMI 28.50 kg/m     Vision, phq 9, advance directive. Med social hx, preventative service.  Vaccines. List of prescition drus.    Review of Systems  Constitutional:  Negative for chills, fatigue and fever.  HENT:  Negative for congestion.         Hearing loss since 61 years. He reads lips.  Respiratory:  Negative for cough, chest tightness, shortness of breath and wheezing.   Cardiovascular:  Negative for chest pain and palpitations.  Gastrointestinal:  Negative for abdominal pain, constipation, diarrhea and rectal pain.  Genitourinary:  Negative for enuresis and frequency.  Musculoskeletal:  Negative for back pain, myalgias and neck pain.  Skin:  Negative for rash.  Neurological:  Negative for dizziness, speech difficulty, weakness, numbness and headaches.  Psychiatric/Behavioral:  Negative for behavioral problems, decreased concentration and hallucinations. The patient is not nervous/anxious.       Objective:   Physical Exam  General Mental Status- Alert. General Appearance- Not in acute distress.   Skin General: Color- Normal Color. Moisture- Normal Moisture.  Neck Carotid Arteries- Normal color. Moisture- Normal Moisture. No carotid bruits. No JVD.  Chest and Lung Exam Auscultation: Breath Sounds:-Normal.  Cardiovascular Auscultation:Rythm- Regular. Murmurs & Other Heart Sounds:Auscultation of the heart  reveals- No Murmurs.  Abdomen Inspection:-Inspeection Normal. Palpation/Percussion:Note:No mass. Palpation and Percussion of the abdomen reveal- Non Tender, Non Distended + BS, no rebound or guarding.  Neurologic Cranial Nerve exam:- CN III-XII intact(No nystagmus), symmetric smile. tStrength:- 5/5 equal and symmetric strength both upper and lower extremities.   Ears- no wax bilateraly.    Assessment & Plan:   Pcv 20 vaccine. Shingrix vaccine at pharmacy. Hep C antibody test. Declined covid vaccine. States had one time this winter and recovered.   Refer to audiologist for hearing loss over past 5 years. Dnr signed today. Pt request. Pt taking scope of treatment form home to discuss with wife.   Psa for frequent urination and screening.  Cbc, cmp and lipid panel for chonic med issues. Not bp  better on recheck 140/70. Follow up date to be determined lab review. Mackie Pai, PA-C

## 2021-09-10 NOTE — Patient Instructions (Addendum)
Welcome to medicare exam today.   Pcv 20 vaccine. Shingrix vaccine at pharmacy. Hep C antibody test. Declined covid vaccine. States had one time this winter and recovered.   Refer to audiologist for hearing loss over past 5 years. Dnr signed today. Pt request. Pt taking scope of treatment form home to discuss with wife.   Psa for frequent urination and screening.  Cbc, cmp and lipid panel for chonic med issues. Not bp better on recheck 140/70. Follow up date to be determined lab review.  Preventive Care 42 Years and Older, Male Preventive care refers to lifestyle choices and visits with your health care provider that can promote health and wellness. Preventive care visits are also called wellness exams. What can I expect for my preventive care visit? Counseling During your preventive care visit, your health care provider may ask about your: Medical history, including: Past medical problems. Family medical history. History of falls. Current health, including: Emotional well-being. Home life and relationship well-being. Sexual activity. Memory and ability to understand (cognition). Lifestyle, including: Alcohol, nicotine or tobacco, and drug use. Access to firearms. Diet, exercise, and sleep habits. Work and work Statistician. Sunscreen use. Safety issues such as seatbelt and bike helmet use. Physical exam Your health care provider will check your: Height and weight. These may be used to calculate your BMI (body mass index). BMI is a measurement that tells if you are at a healthy weight. Waist circumference. This measures the distance around your waistline. This measurement also tells if you are at a healthy weight and may help predict your risk of certain diseases, such as type 2 diabetes and high blood pressure. Heart rate and blood pressure. Body temperature. Skin for abnormal spots. What immunizations do I need? Vaccines are usually given at various ages, according to a  schedule. Your health care provider will recommend vaccines for you based on your age, medical history, and lifestyle or other factors, such as travel or where you work. What tests do I need? Screening Your health care provider may recommend screening tests for certain conditions. This may include: Lipid and cholesterol levels. Diabetes screening. This is done by checking your blood sugar (glucose) after you have not eaten for a while (fasting). Hepatitis C test. Hepatitis B test. HIV (human immunodeficiency virus) test. STI (sexually transmitted infection) testing, if you are at risk. Lung cancer screening. Colorectal cancer screening. Prostate cancer screening. Abdominal aortic aneurysm (AAA) screening. You may need this if you are a current or former smoker. Talk with your health care provider about your test results, treatment options, and if necessary, the need for more tests. Follow these instructions at home: Eating and drinking  Eat a diet that includes fresh fruits and vegetables, whole grains, lean protein, and low-fat dairy products. Limit your intake of foods with high amounts of sugar, saturated fats, and salt. Take vitamin and mineral supplements as recommended by your health care provider. Do not drink alcohol if your health care provider tells you not to drink. If you drink alcohol: Limit how much you have to 0-2 drinks a day. Know how much alcohol is in your drink. In the [place] one drink equals one 12 oz bottle of beer (355 mL), one 5 oz glass of wine (148 mL), or one 1 oz glass of hard liquor (44 mL). Lifestyle Brush your teeth every morning and night with fluoride toothpaste. Floss one time each day. Exercise for at least 30 minutes 5 or more days each week. Do not use  any products that contain nicotine or tobacco. These products include cigarettes, chewing tobacco, and vaping devices, such as e-cigarettes. If you need help quitting, ask your health care provider. Do  not use drugs. If you are sexually active, practice safe sex. Use a condom or other form of protection to prevent STIs. Take aspirin only as told by your health care provider. Make sure that you understand how much to take and what form to take. Work with your health care provider to find out whether it is safe and beneficial for you to take aspirin daily. Ask your health care provider if you need to take a cholesterol-lowering medicine (statin). Find healthy ways to manage stress, such as: Meditation, yoga, or listening to music. Journaling. Talking to a trusted person. Spending time with friends and family. Safety Always wear your seat belt while driving or riding in a vehicle. Do not drive: If you have been drinking alcohol. Do not ride with someone who has been drinking. When you are tired or distracted. While texting. If you have been using any mind-altering substances or drugs. Wear a helmet and other protective equipment during sports activities. If you have firearms in your house, make sure you follow all gun safety procedures. Minimize exposure to UV radiation to reduce your risk of skin cancer. What's next? Visit your health care provider once a year for an annual wellness visit. Ask your health care provider how often you should have your eyes and teeth checked. Stay up to date on all vaccines. This information is not intended to replace advice given to you by your health care provider. Make sure you discuss any questions you have with your health care provider. Document Revised: 01/02/2021 Document Reviewed: 01/02/2021 Elsevier Patient Education  [place].

## 2021-09-12 ENCOUNTER — Telehealth: Payer: Self-pay | Admitting: Medical

## 2021-09-12 DIAGNOSIS — I10 Essential (primary) hypertension: Secondary | ICD-10-CM

## 2021-09-12 DIAGNOSIS — Z1159 Encounter for screening for other viral diseases: Secondary | ICD-10-CM

## 2021-09-12 DIAGNOSIS — R739 Hyperglycemia, unspecified: Secondary | ICD-10-CM

## 2021-09-12 DIAGNOSIS — E785 Hyperlipidemia, unspecified: Secondary | ICD-10-CM

## 2021-09-12 DIAGNOSIS — R350 Frequency of micturition: Secondary | ICD-10-CM

## 2021-09-12 NOTE — Telephone Encounter (Signed)
Future labs placed. Mackie Pai, PA-C

## 2021-09-12 NOTE — Telephone Encounter (Signed)
Jarrett Soho called pt yesterday for visit and no answer. Rod Holler called pt today for other visit and no answer. I called at 11:45 today and no answer as well.  Mackie Pai, PA-C

## 2021-09-13 ENCOUNTER — Other Ambulatory Visit: Payer: Self-pay

## 2021-09-13 DIAGNOSIS — F17210 Nicotine dependence, cigarettes, uncomplicated: Secondary | ICD-10-CM

## 2021-09-13 DIAGNOSIS — Z87891 Personal history of nicotine dependence: Secondary | ICD-10-CM

## 2021-10-29 ENCOUNTER — Other Ambulatory Visit: Payer: Self-pay | Admitting: Medical

## 2021-10-30 ENCOUNTER — Ambulatory Visit (INDEPENDENT_AMBULATORY_CARE_PROVIDER_SITE_OTHER): Payer: Medicare Other | Admitting: Medical

## 2021-10-30 VITALS — BP 140/70 | HR 90 | Resp 18 | Ht 73.0 in | Wt 220.0 lb

## 2021-10-30 DIAGNOSIS — E785 Hyperlipidemia, unspecified: Secondary | ICD-10-CM | POA: Diagnosis not present

## 2021-10-30 DIAGNOSIS — J432 Centrilobular emphysema: Secondary | ICD-10-CM

## 2021-10-30 DIAGNOSIS — I10 Essential (primary) hypertension: Secondary | ICD-10-CM | POA: Diagnosis not present

## 2021-10-30 DIAGNOSIS — R739 Hyperglycemia, unspecified: Secondary | ICD-10-CM

## 2021-10-30 DIAGNOSIS — R5383 Other fatigue: Secondary | ICD-10-CM | POA: Diagnosis not present

## 2021-10-30 DIAGNOSIS — Z125 Encounter for screening for malignant neoplasm of prostate: Secondary | ICD-10-CM | POA: Diagnosis not present

## 2021-10-30 LAB — LIPID PANEL
Cholesterol: 154 mg/dL (ref 0–200)
HDL: 42.8 mg/dL (ref >39.00)
NonHDL: 111.62
Total CHOL/HDL Ratio: 4
Triglycerides: 242 mg/dL — ABNORMAL HIGH (ref 0.0–149.0)
VLDL: 48.4 mg/dL — ABNORMAL HIGH (ref 0.0–40.0)

## 2021-10-30 LAB — CBC WITH DIFFERENTIAL/PLATELET
Basophils Absolute: 0.1 10*3/uL (ref 0.0–0.1)
Basophils Relative: 0.6 % (ref 0.0–3.0)
Eosinophils Absolute: 0.2 10*3/uL (ref 0.0–0.7)
Eosinophils Relative: 2 % (ref 0.0–5.0)
HCT: 43.6 % (ref 39.0–52.0)
Hemoglobin: 14.1 g/dL (ref 13.0–17.0)
Lymphocytes Relative: 24.6 % (ref 12.0–46.0)
Lymphs Abs: 2.9 10*3/uL (ref 0.7–4.0)
MCHC: 32.4 g/dL (ref 30.0–36.0)
MCV: 89.3 fl (ref 78.0–100.0)
Monocytes Absolute: 0.9 10*3/uL (ref 0.1–1.0)
Monocytes Relative: 7.3 % (ref 3.0–12.0)
Neutro Abs: 7.8 10*3/uL — ABNORMAL HIGH (ref 1.4–7.7)
Neutrophils Relative %: 65.5 % (ref 43.0–77.0)
Platelets: 336 10*3/uL (ref 150.0–400.0)
RBC: 4.88 Mil/uL (ref 4.22–5.81)
RDW: 14 % (ref 11.5–15.5)
WBC: 11.9 10*3/uL — ABNORMAL HIGH (ref 4.0–10.5)

## 2021-10-30 LAB — COMPREHENSIVE METABOLIC PANEL
ALT: 39 U/L (ref 0–53)
AST: 22 U/L (ref 0–37)
Albumin: 4.6 g/dL (ref 3.5–5.2)
Alkaline Phosphatase: 82 U/L (ref 39–117)
BUN: 19 mg/dL (ref 6–23)
CO2: 24 mEq/L (ref 19–32)
Calcium: 9.9 mg/dL (ref 8.4–10.5)
Chloride: 103 mEq/L (ref 96–112)
Creatinine, Ser: 1.04 mg/dL (ref 0.40–1.50)
GFR: 75.55 mL/min (ref >60.00)
Glucose, Bld: 107 mg/dL — ABNORMAL HIGH (ref 70–99)
Potassium: 4.6 mEq/L (ref 3.5–5.1)
Sodium: 137 mEq/L (ref 135–145)
Total Bilirubin: 0.4 mg/dL (ref 0.2–1.2)
Total Protein: 7.1 g/dL (ref 6.0–8.3)

## 2021-10-30 LAB — PSA: PSA: 1.38 ng/mL (ref 0.10–4.00)

## 2021-10-30 LAB — TSH: TSH: 3.15 u[IU]/mL (ref 0.35–5.50)

## 2021-10-30 LAB — HEMOGLOBIN A1C: Hgb A1c MFr Bld: 6.3 % (ref 4.6–6.5)

## 2021-10-30 LAB — VITAMIN B12: Vitamin B-12: 958 pg/mL — ABNORMAL HIGH (ref 211–911)

## 2021-10-30 LAB — T4, FREE: Free T4: 0.92 ng/dL (ref 0.60–1.60)

## 2021-10-30 LAB — LDL CHOLESTEROL, DIRECT: Direct LDL: 99 mg/dL

## 2021-10-30 NOTE — Patient Instructions (Addendum)
Htn- bp relatively controlled. Continue amlodipine 10 mg daily. ? ?For elevated sugar get cmp and a1c today. Decide on dosing of metformin after review. ? ?For prostate CA screening get psa today. ? ?Copd by ct but asymptomatic. Continue to cut back/stop smoking. ? ?For fatigue get cbc, b12 and b1 level. ? ?For high cholesterol get lipid panel. Atorvastatin 20 mg daily. ? ?Follow up  3 month or sooner if needed. ? ?Please will out the form scope of treatment form. Instructed MA to retract prior signe DNR form. ? ?Attend audiology appt. ?

## 2021-10-30 NOTE — Progress Notes (Signed)
? ?Subjective:  ? ? Patient ID: Timothy Hall, male    DOB: Apr 04, 1956, 66 y.o.   MRN: [id number] ? ?HPI ? ?Pt in for follow up. ? ?Pt has hearing loss over the years. I had referred to ENT. Wife plans to call them back.  ? ?Htn- pt is on amlodipine 10 mg daily. ? ?Elevated blood sugar-last visit placed labs future and he did not get those done. Pt is just on metformin 1 tab daily. Looser stools on twice daily use. ? ?Hyperlipidemia- he is on lipitor 20 mg daily. ? ?Copd and hx of smoking. Asymptomatic so pulmonologist not prescribing inhaler. Pt is cutting back on smoking. ? ?Pt after discussion retracted his DNR.  ? ?Review of Systems  ?Constitutional:  Negative for chills, fatigue and fever.  ?HENT:  Negative for congestion.   ?     Hearing loss/decreased hearing  ?Respiratory:  Negative for chest tightness, shortness of breath and wheezing.   ?Cardiovascular:  Negative for chest pain.  ?Gastrointestinal:  Negative for abdominal pain, diarrhea and nausea.  ?Genitourinary:  Negative for dysuria and flank pain.  ?Musculoskeletal:  Negative for back pain.  ?Neurological:  Negative for dizziness, light-headedness and headaches.  ?Hematological:  Negative for adenopathy. Does not bruise/bleed easily.  ?Psychiatric/Behavioral:  Negative for behavioral problems, confusion, decreased concentration and dysphoric mood.   ? ? ?Past Medical History:  ?Diagnosis Date  ? Cancer [facility])   ? Complication of anesthesia   ? slow to awaken  ? Gastric neoplasm   ? Cystic, 18 cm - recently diagnosed October 2014   ? GERD (gastroesophageal reflux disease)   ? Heart murmur age 66  ? History of alcoholism ([place])   ? Quit drinking in 1985  ? Pneumonia age 66  ? hx of  ? Rash   ? upper back due to gleevac  ? Sleep apnea   ? Intolerant of CPAP  ? ?  ?Social History  ? ?Socioeconomic History  ? Marital status: Married  ?  Spouse name: Not on file  ? Number of children: Not on file  ? Years of education: Not on file  ? Highest education level:  Not on file  ?Occupational History  ? Occupation: Hospital doctor  ?  Employer: [employer]  ?Tobacco Use  ? Smoking status: Every Day  ?  Packs/day: 2.00  ?  Years: 40.00  ?  Pack years: 80.00  ?  Types: Cigarettes  ? Smokeless tobacco: Never  ? Tobacco comments:  ?  a pack and a quarter.   ?Substance and Sexual Activity  ? Alcohol use: No  ?  Comment: quit in 1985  ? Drug use: No  ?  Comment: History of alcoholism  ? Sexual activity: Yes  ?Other Topics Concern  ? Not on file  ?Social History Narrative  ? Married, wife [name]  ? Has #1 grown daughter and a grandson  ? Has a dog he is close to  ? Enjoys working on cars and fishing  ? ?Social Determinants of Health  ? ?Financial Resource Strain: Not on file  ?Food Insecurity: Not on file  ?Transportation Needs: Not on file  ?Physical Activity: Not on file  ?Stress: Not on file  ?Social Connections: Not on file  ?Intimate Partner Violence: Not on file  ? ? ?Past Surgical History:  ?Procedure Laterality Date  ? breast tumor removed Right 1976  ? COLONOSCOPY N/A 11/20/2014  ? Procedure: COLONOSCOPY;  Surgeon: [name], MD;  [facility];  Service: Endoscopy;  Laterality: N/A;  1030 - moved to 5/2 @ 10:30  ? EUS N/A 05/25/2013  ? Procedure: ESOPHAGEAL ENDOSCOPIC ULTRASOUND (EUS) RADIAL;  Surgeon: [name], MD;  [facility];  Service: Endoscopy;  Laterality: N/A;  ? EUS N/A 07/12/2014  ? EGD by Dr. [name]: small hh, widely patent Schatzki's ring. evidence of prior small gastric wedge resection along body of stomach. no evidence of recurrent tumor.   ? FINE NEEDLE ASPIRATION N/A 05/25/2013  ? Procedure: FINE NEEDLE ASPIRATION (FNA) LINEAR;  Surgeon: [name], MD;  [facility];  Service: Endoscopy;  Laterality: N/A;"this part never happend"  ? LAPAROSCOPIC GASTRECTOMY N/A 07/19/2013  ? Procedure: RESECTION OF RETROGASTRIC CYSTIC NEOPLASM WITH PARTIAL GASTRECTOMY;  Surgeon: [name], MD;  [facility];   Service: General;  Laterality: N/A;  ? lipoma removed from back  yrs ago  ? THYROIDECTOMY Left 01/07/2019  ? Procedure: Left thyroid lobectomy, with frozen section;  Surgeon: [name], MD;  Location: [place];  Service: ENT;  Laterality: Left;  ? TUMOR REMOVAL    ? Benign tumor removed from left breast and back area.   ? ? ?Family History  ?Problem Relation Age of Onset  ? Breast cancer Mother   ? CAD Brother   ?     Premature disease  ? Colon cancer Neg Hx   ? Stomach cancer Neg Hx   ? ? ?Allergies  ?Allergen Reactions  ? Nicoderm [Nicotine] Rash  ? ? ?Current Outpatient Medications on File Prior to Visit  ?Medication Sig Dispense Refill  ? amLODipine (NORVASC) 10 MG tablet Take 1 tablet (10 mg total) by mouth daily. 90 tablet 3  ? atorvastatin (LIPITOR) 20 MG tablet Take 1 tablet (20 mg total) by mouth daily. 90 tablet 3  ? buPROPion (WELLBUTRIN XL) 150 MG 24 hr tablet Take 1 tablet (150 mg total) by mouth daily. 30 tablet 3  ? co-enzyme Q-10 50 MG capsule Take 50 mg by mouth daily.    ? docusate sodium (COLACE) 100 MG capsule Take 100 mg by mouth daily as needed for mild constipation.    ? fluticasone (FLONASE) 50 MCG/ACT nasal spray Place 2 sprays into both nostrils daily. 16 g 1  ? influenza vac split quadrivalent PF (FLUARIX) 0.5 ML injection Inject into the muscle. 0.5 mL 0  ? metFORMIN (GLUCOPHAGE) 500 MG tablet TAKE 1 TABLET BY MOUTH TWICE A DAY WITH A MEAL. 60 tablet 0  ? pantoprazole (PROTONIX) 20 MG tablet TAKE (1) TABLET BY MOUTH TWICE DAILY. 60 tablet 0  ? polyethylene glycol powder (GLYCOLAX/MIRALAX) 17 GM/SCOOP powder Take 17 g by mouth daily as needed.    ? thiamine (VITAMIN B-1) 100 MG tablet Take 100 mg by mouth daily.    ? vitamin B-12 (CYANOCOBALAMIN) 1000 MCG tablet Take 1,000 mcg by mouth daily.    ? ?No current facility-administered medications on file prior to visit.  ? ? ?BP 140/70   Pulse 90   Resp 18   Ht '6\' 1"'$  (1.854 m)   Wt 220 lb (99.8 kg)   SpO2 96%   BMI 29.03 kg/m?  ?  ?    ?Objective:  ? Physical Exam ? ?General ?Mental Status- Alert. General Appearance- Not in acute distress.  ? ?Skin ?General: Color- Normal Color. Moisture- Normal Moisture. ? ?Neck ?Carotid Arteries- Normal color. Moisture- Normal Moisture. No carotid bruits. No JVD. ? ?Chest and Lung Exam ?Auscultation: ?Breath Sounds:-Normal. ? ?Cardiovascular ?  Auscultation:Rythm- Regular. ?Murmurs & Other Heart Sounds:Auscultation of the heart reveals- No Murmurs. ? ?Abdomen ?Inspection:-Inspeection Normal. ?Palpation/Percussion:Note:No mass. Palpation and Percussion of the abdomen reveal- Non Tender, Non Distended + BS, no rebound or guarding. ? ?Neurologic ?Cranial Nerve exam:- CN III-XII intact(No nystagmus), symmetric smile. ?Strength:- 5/5 equal and symmetric strength both upper and lower extremities.  ? ? ? ?Assessment & Plan:  ? ?Patient Instructions  ?Htn- bp relatively controlled. Continue amlodipine 10 mg daily. ? ?For elevated sugar get cmp and a1c today. Decide on dosing of metformin after review. ? ?For prostate CA screening get psa today. ? ?Copd by ct but asymptomatic. Continue to cut back/stop smoking. ? ?For fatigue get cbc, b12 and b1 level. ? ?For high cholesterol get lipid panel. Atorvastatin 20 mg daily. ? ?Follow up  3 month or sooner if needed. ? ?Please will out the form scope of treatment form. Instructed MA to retract prior signe DNR form. ? ?Attend audiology appt.  ? ?Mackie Pai, PA-C  ?

## 2021-11-04 LAB — VITAMIN B1: Vitamin B1 (Thiamine): 198 nmol/L — ABNORMAL HIGH (ref 8–30)

## 2021-11-28 ENCOUNTER — Other Ambulatory Visit: Payer: Self-pay | Admitting: Medical

## 2021-12-03 ENCOUNTER — Other Ambulatory Visit: Payer: Self-pay | Admitting: Medical

## 2022-01-14 ENCOUNTER — Other Ambulatory Visit: Payer: Self-pay | Admitting: Medical

## 2022-01-31 ENCOUNTER — Encounter: Payer: Self-pay | Admitting: Medical

## 2022-01-31 ENCOUNTER — Ambulatory Visit (INDEPENDENT_AMBULATORY_CARE_PROVIDER_SITE_OTHER): Payer: Medicare Other | Admitting: Medical

## 2022-01-31 ENCOUNTER — Ambulatory Visit (HOSPITAL_BASED_OUTPATIENT_CLINIC_OR_DEPARTMENT_OTHER)
Admission: RE | Admit: 2022-01-31 | Discharge: 2022-01-31 | Disposition: A | Discharge status: Home / Self Care | Payer: Medicare Other | Source: Ambulatory Visit | Attending: Medical | Admitting: Medical

## 2022-01-31 VITALS — BP 126/79 | HR 92 | Resp 18 | Ht 73.0 in | Wt 217.8 lb

## 2022-01-31 DIAGNOSIS — M79661 Pain in right lower leg: Secondary | ICD-10-CM | POA: Insufficient documentation

## 2022-01-31 DIAGNOSIS — R739 Hyperglycemia, unspecified: Secondary | ICD-10-CM | POA: Diagnosis not present

## 2022-01-31 DIAGNOSIS — E785 Hyperlipidemia, unspecified: Secondary | ICD-10-CM

## 2022-01-31 DIAGNOSIS — I10 Essential (primary) hypertension: Secondary | ICD-10-CM | POA: Diagnosis not present

## 2022-01-31 DIAGNOSIS — E519 Thiamine deficiency, unspecified: Secondary | ICD-10-CM

## 2022-01-31 DIAGNOSIS — E538 Deficiency of other specified B group vitamins: Secondary | ICD-10-CM | POA: Diagnosis not present

## 2022-01-31 DIAGNOSIS — R5383 Other fatigue: Secondary | ICD-10-CM

## 2022-01-31 LAB — COMPREHENSIVE METABOLIC PANEL
ALT: 47 U/L (ref 0–53)
AST: 23 U/L (ref 0–37)
Albumin: 4.7 g/dL (ref 3.5–5.2)
Alkaline Phosphatase: 92 U/L (ref 39–117)
BUN: 20 mg/dL (ref 6–23)
CO2: 24 mEq/L (ref 19–32)
Calcium: 9.8 mg/dL (ref 8.4–10.5)
Chloride: 104 mEq/L (ref 96–112)
Creatinine, Ser: 1.07 mg/dL (ref 0.40–1.50)
GFR: 72.88 mL/min (ref >60.00)
Glucose, Bld: 92 mg/dL (ref 70–99)
Potassium: 4.2 mEq/L (ref 3.5–5.1)
Sodium: 139 mEq/L (ref 135–145)
Total Bilirubin: 0.5 mg/dL (ref 0.2–1.2)
Total Protein: 7.4 g/dL (ref 6.0–8.3)

## 2022-01-31 LAB — LIPID PANEL
Cholesterol: 142 mg/dL (ref 0–200)
HDL: 43.4 mg/dL (ref >39.00)
LDL Cholesterol: 61 mg/dL (ref 0–99)
NonHDL: 98.68
Total CHOL/HDL Ratio: 3
Triglycerides: 190 mg/dL — ABNORMAL HIGH (ref 0.0–149.0)
VLDL: 38 mg/dL (ref 0.0–40.0)

## 2022-01-31 LAB — HEMOGLOBIN A1C: Hgb A1c MFr Bld: 6.2 % (ref 4.6–6.5)

## 2022-01-31 LAB — VITAMIN B12: Vitamin B-12: 724 pg/mL (ref 211–911)

## 2022-01-31 MED ORDER — LOSARTAN POTASSIUM 25 MG PO TABS: 25.0000 mg | ORAL_TABLET | Freq: Every day | ORAL | 0 refills | Status: DC

## 2022-01-31 NOTE — Patient Instructions (Addendum)
Htn- bp high today and last 3-4 visit borderline as well. Continue amlodipine 10 mg daily and add on losartan 25 mg daily. Check bp daily and send me my chart update in 10 days.   For elevated sugar get cmp and a1c today. Currently on metformin daily.     Copd by ct but asymptomatic. Continue to cut back/stop smoking.   For low b12 and b1 get those levels today.   For high cholesterol get lipid panel. Atorvastatin 20 mg daily.  For rt calf pain will go ahead and get rt lower extremity US. Make sure not DVT.   Follow up  3 month or sooner if needed.

## 2022-01-31 NOTE — Progress Notes (Signed)
Subjective:    Patient ID: Timothy Hall, male    DOB: May 29, 1956, 66 y.o.   MRN: [id number]  HPI  Htn- pt is on amlodipine 10 mg daily.   Elevated blood sugar-last visit placed labs future and he did not get those done. Pt is just on metformin 1 tab daily. Looser stools on twice daily use.   Hyperlipidemia- he is on lipitor 20 mg daily.   Copd and hx of smoking. Asymptomatic so pulmonologist not prescribing inhaler. Pt is cutting back on smoking. About half a pack a day.  Low b12 level in past. On 1000 mcg daily dose.  On b1 100 mg daily.  2-3 years of minimal rt side medial calf pain when walks. Pain is worse when he walks.    Review of Systems  Constitutional:  Negative for chills, fatigue and fever.  HENT:  Negative for congestion.   Respiratory:  Negative for chest tightness, shortness of breath and wheezing.   Cardiovascular:  Negative for chest pain.  Gastrointestinal:  Negative for abdominal pain, diarrhea and nausea.  Genitourinary:  Negative for dysuria and flank pain.  Musculoskeletal:  Negative for back pain.  Neurological:  Negative for dizziness, light-headedness and headaches.  Hematological:  Negative for adenopathy. Does not bruise/bleed easily.  Psychiatric/Behavioral:  Negative for behavioral problems, confusion, decreased concentration and dysphoric mood.     Past Medical History:  Diagnosis Date   Cancer ([place])    Complication of anesthesia    slow to awaken   Gastric neoplasm    Cystic, 18 cm - recently diagnosed October 2014    GERD (gastroesophageal reflux disease)    Heart murmur age 71   History of alcoholism ([place])    Quit drinking in 1985   Pneumonia age 89   hx of   Rash    upper back due to gleevac   Sleep apnea    Intolerant of CPAP     Social History   Socioeconomic History   Marital status: Married    Spouse name: Not on file   Number of children: Not on file   Years of education: Not on file   Highest education level: Not on  file  Occupational History   Occupation: [place]    Employer: [employer]  Tobacco Use   Smoking status: Every Day    Packs/day: 2.00    Years: 40.00    Total pack years: 80.00    Types: Cigarettes   Smokeless tobacco: Never   Tobacco comments:    a pack and a quarter.   Substance and Sexual Activity   Alcohol use: No    Comment: quit in 1985   Drug use: No    Comment: History of alcoholism   Sexual activity: Yes  Other Topics Concern   Not on file  Social History Narrative   Married, wife [name]   Has #1 grown daughter and a grandson   Has a dog he is close to   Enjoys working on cars and fishing   Social Determinants of Radio broadcast assistant Strain: Not on file  Food Insecurity: Not on file  Transportation Needs: Not on file  Physical Activity: Not on file  Stress: Not on file  Social Connections: Not on file  Intimate Partner Violence: Not on file    Past Surgical History:  Procedure Laterality Date   breast tumor removed Right 1976   COLONOSCOPY N/A 11/20/2014   Procedure: COLONOSCOPY;  Surgeon: Herbie Baltimore  Hilton Cork, MD;  [facility];  Service: Endoscopy;  Laterality: N/A;  1030 - moved to 5/2 @ 10:30   EUS N/A 05/25/2013   Procedure: ESOPHAGEAL ENDOSCOPIC ULTRASOUND (EUS) RADIAL;  Surgeon: [name], MD;  [facility];  Service: Endoscopy;  Laterality: N/A;   EUS N/A 07/12/2014   EGD by Dr. [name]: small hh, widely patent Schatzki's ring. evidence of prior small gastric wedge resection along body of stomach. no evidence of recurrent tumor.    FINE NEEDLE ASPIRATION N/A 05/25/2013   Procedure: FINE NEEDLE ASPIRATION (FNA) LINEAR;  Surgeon: [name], MD;  [facility];  Service: Endoscopy;  Laterality: N/A;"this part never happend"   LAPAROSCOPIC GASTRECTOMY N/A 07/19/2013   Procedure: RESECTION OF RETROGASTRIC CYSTIC NEOPLASM WITH PARTIAL GASTRECTOMY;  Surgeon: [name], MD;  [facility];   Service: General;  Laterality: N/A;   lipoma removed from back  yrs ago   THYROIDECTOMY Left 01/07/2019   Procedure: Left thyroid lobectomy, with frozen section;  Surgeon: [name], MD;  Location: [place];  Service: ENT;  Laterality: Left;   TUMOR REMOVAL     Benign tumor removed from left breast and back area.     Family History  Problem Relation Age of Onset   Breast cancer Mother    CAD Brother        Premature disease   Colon cancer Neg Hx    Stomach cancer Neg Hx     Allergies  Allergen Reactions   Nicoderm [Nicotine] Rash    Current Outpatient Medications on File Prior to Visit  Medication Sig Dispense Refill   amLODipine (NORVASC) 10 MG tablet Take 1 tablet (10 mg total) by mouth daily. 90 tablet 3   atorvastatin (LIPITOR) 20 MG tablet Take 1 tablet (20 mg total) by mouth daily. 90 tablet 3   buPROPion (WELLBUTRIN XL) 150 MG 24 hr tablet TAKE ONE TABLET BY MOUTH ONCE DAILY. 30 tablet 0   co-enzyme Q-10 50 MG capsule Take 50 mg by mouth daily.     docusate sodium (COLACE) 100 MG capsule Take 100 mg by mouth daily as needed for mild constipation.     fluticasone (FLONASE) 50 MCG/ACT nasal spray Place 2 sprays into both nostrils daily. 16 g 1   influenza vac split quadrivalent PF (FLUARIX) 0.5 ML injection Inject into the muscle. 0.5 mL 0   metFORMIN (GLUCOPHAGE) 500 MG tablet TAKE 1 TABLET BY MOUTH TWICE A DAY WITH A MEAL. 60 tablet 0   pantoprazole (PROTONIX) 20 MG tablet TAKE (1) TABLET BY MOUTH TWICE DAILY. 60 tablet 0   polyethylene glycol powder (GLYCOLAX/MIRALAX) 17 GM/SCOOP powder Take 17 g by mouth daily as needed.     thiamine (VITAMIN B-1) 100 MG tablet Take 100 mg by mouth daily.     vitamin B-12 (CYANOCOBALAMIN) 1000 MCG tablet Take 1,000 mcg by mouth daily.     No current facility-administered medications on file prior to visit.    BP (!) 148/75   Pulse 92   Resp 18   Ht '6\' 1"'$  (1.854 m)   Wt 217 lb 12.8 oz (98.8 kg)   SpO2 98%   BMI 28.74 kg/m         Objective:   Physical Exam General Mental Status- Alert. General Appearance- Not in acute distress.   Skin General: Color- Normal Color. Moisture- Normal Moisture.  Neck Carotid Arteries- Normal color. Moisture- Normal Moisture. No carotid bruits. No JVD.  Chest and Lung Exam  Auscultation: Breath Sounds:-Normal.  Cardiovascular Auscultation:Rythm- Regular. Murmurs & Other Heart Sounds:Auscultation of the heart reveals- No Murmurs.  Abdomen Inspection:-Inspeection Normal. Palpation/Percussion:Note:No mass. Palpation and Percussion of the abdomen reveal- Non Tender, Non Distended + BS, no rebound or guarding.   Neurologic Cranial Nerve exam:- CN III-XII intact(No nystagmus), symmetric smile. Strength:- 5/5 equal and symmetric strength both upper and lower extremities.    Rt calf- mild medial aspect tenderness to palpation. Negative homans signs. Calf not swollen      Assessment & Plan:   Htn- bp high today and last 3-4 visit borderline as well. Continue amlodipine 10 mg daily and add on losartan 25 mg daily. Check bp daily and send me my chart update in 10 days.   For elevated sugar get cmp and a1c today. Currently on metformin daily.     Copd by ct but asymptomatic. Continue to cut back/stop smoking.   For low b12 and b1 get those levels today.   For high cholesterol get lipid panel. Atorvastatin 20 mg daily.   Follow up  3 month or sooner if needed.  Mackie Pai, PA-C

## 2022-02-04 LAB — VITAMIN B1: Vitamin B1 (Thiamine): 108 nmol/L — ABNORMAL HIGH (ref 8–30)

## 2022-02-16 ENCOUNTER — Encounter: Payer: Self-pay | Admitting: Medical

## 2022-02-18 ENCOUNTER — Other Ambulatory Visit: Payer: Self-pay | Admitting: Medical

## 2022-02-25 ENCOUNTER — Other Ambulatory Visit: Payer: Self-pay | Admitting: Medical

## 2022-03-25 ENCOUNTER — Other Ambulatory Visit: Payer: Self-pay

## 2022-04-01 ENCOUNTER — Other Ambulatory Visit: Payer: Self-pay | Admitting: Medical

## 2022-05-16 ENCOUNTER — Ambulatory Visit: Payer: Medicare Other | Admitting: Medical

## 2022-05-30 ENCOUNTER — Encounter: Payer: Self-pay | Admitting: Medical

## 2022-05-30 ENCOUNTER — Ambulatory Visit (INDEPENDENT_AMBULATORY_CARE_PROVIDER_SITE_OTHER): Payer: Medicare Other | Admitting: Medical

## 2022-05-30 VITALS — BP 142/85 | HR 71 | Temp 98.0°F | Resp 18 | Ht 73.0 in | Wt 222.8 lb

## 2022-05-30 DIAGNOSIS — I10 Essential (primary) hypertension: Secondary | ICD-10-CM | POA: Diagnosis not present

## 2022-05-30 DIAGNOSIS — Z23 Encounter for immunization: Secondary | ICD-10-CM | POA: Diagnosis not present

## 2022-05-30 DIAGNOSIS — R739 Hyperglycemia, unspecified: Secondary | ICD-10-CM | POA: Diagnosis not present

## 2022-05-30 DIAGNOSIS — E785 Hyperlipidemia, unspecified: Secondary | ICD-10-CM

## 2022-05-30 LAB — LIPID PANEL
Cholesterol: 167 mg/dL (ref 0–200)
HDL: 38.9 mg/dL — ABNORMAL LOW (ref >39.00)
NonHDL: 128.27
Total CHOL/HDL Ratio: 4
Triglycerides: 224 mg/dL — ABNORMAL HIGH (ref 0.0–149.0)
VLDL: 44.8 mg/dL — ABNORMAL HIGH (ref 0.0–40.0)

## 2022-05-30 LAB — COMPREHENSIVE METABOLIC PANEL
ALT: 41 U/L (ref 0–53)
AST: 21 U/L (ref 0–37)
Albumin: 4.7 g/dL (ref 3.5–5.2)
Alkaline Phosphatase: 90 U/L (ref 39–117)
BUN: 20 mg/dL (ref 6–23)
CO2: 27 mEq/L (ref 19–32)
Calcium: 9.7 mg/dL (ref 8.4–10.5)
Chloride: 103 mEq/L (ref 96–112)
Creatinine, Ser: 1.07 mg/dL (ref 0.40–1.50)
GFR: 72.71 mL/min (ref >60.00)
Glucose, Bld: 95 mg/dL (ref 70–99)
Potassium: 4.5 mEq/L (ref 3.5–5.1)
Sodium: 138 mEq/L (ref 135–145)
Total Bilirubin: 0.4 mg/dL (ref 0.2–1.2)
Total Protein: 7.4 g/dL (ref 6.0–8.3)

## 2022-05-30 LAB — HEMOGLOBIN A1C: Hgb A1c MFr Bld: 6.2 % (ref 4.6–6.5)

## 2022-05-30 LAB — LDL CHOLESTEROL, DIRECT: Direct LDL: 112 mg/dL

## 2022-05-30 MED ORDER — BUPROPION HCL ER (XL) 150 MG PO TB24: 150.0000 mg | ORAL_TABLET | Freq: Every day | ORAL | 0 refills | Status: DC

## 2022-05-30 MED ORDER — LOSARTAN POTASSIUM 25 MG PO TABS: 25.0000 mg | ORAL_TABLET | Freq: Every day | ORAL | 3 refills | Status: DC

## 2022-05-30 NOTE — Patient Instructions (Addendum)
Htn- bp on high side today on recheck. On discussion appears this is without meds. Please take med today and update me on bp readings/daily checks on Monday morning.  Elevated blood sugar- check cmp and A1c. If A1c well controlled can continue just 1 tab metformin.  High cholesterol- continue atorvastatin. Get lipid panel today.  Flu vaccine today.  Smoker- decreasing smoking with wellbutrin. Now under a pack. Keep tapering off. Refilled med today.  Follow up date to be determined after lab review.

## 2022-05-30 NOTE — Progress Notes (Signed)
Subjective:    Patient ID: Timothy Hall, male    DOB: 1955/12/27, 66 y.o.   MRN: [id number]  HPI Pt in for follow up.  Last visit A/P  "Htn- bp high today and last 3-4 visit borderline as well. Continue amlodipine 10 mg daily and add on losartan 25 mg daily. Check bp daily and send me my chart update in 10 days.   For elevated sugar get cmp and a1c today. Currently on metformin daily.     Copd by ct but asymptomatic. Continue to cut back/stop smoking.   For low b12 and b1 get those levels today.   For high cholesterol get lipid panel. Atorvastatin 20 mg daily."   Wife states he did not take his bp meds today.   Elevated sugar- pt is on metformin a day. If takes second tablet will get diarrhea.  Hyperlipidemia- on lipitor 20 mg daily.   Will get flu vaccine.  Declines shingles vaccine.   Review of Systems  Constitutional:  Negative for chills, fatigue and fever.  HENT:  Negative for congestion, ear discharge and mouth sores.   Respiratory:  Negative for cough, choking and wheezing.   Cardiovascular:  Negative for chest pain and palpitations.  Gastrointestinal:  Negative for abdominal pain, constipation and diarrhea.  Musculoskeletal:  Negative for back pain and joint swelling.  Skin:  Negative for rash.  Neurological:  Negative for dizziness, facial asymmetry and headaches.  Hematological:  Negative for adenopathy.  Psychiatric/Behavioral:  Negative for decreased concentration and dysphoric mood.     Past Medical History:  Diagnosis Date   Cancer ([place])    Complication of anesthesia    slow to awaken   Gastric neoplasm    Cystic, 18 cm - recently diagnosed October 2014    GERD (gastroesophageal reflux disease)    Heart murmur age 82   History of alcoholism ([place])    Quit drinking in 1985   Pneumonia age 30   hx of   Rash    upper back due to gleevac   Sleep apnea    Intolerant of CPAP     Social History   Socioeconomic History   Marital status:  Married    Spouse name: Not on file   Number of children: Not on file   Years of education: Not on file   Highest education level: Not on file  Occupational History   Occupation: [place]    Employer: [employer]  Tobacco Use   Smoking status: Every Day    Packs/day: 2.00    Years: 40.00    Total pack years: 80.00    Types: Cigarettes   Smokeless tobacco: Never   Tobacco comments:    a pack and a quarter.   Substance and Sexual Activity   Alcohol use: No    Comment: quit in 1985   Drug use: No    Comment: History of alcoholism   Sexual activity: Yes  Other Topics Concern   Not on file  Social History Narrative   Married, wife [name]   Has #1 grown daughter and a grandson   Has a dog he is close to   Enjoys working on cars and fishing   Social Determinants of Radio broadcast assistant Strain: Not on file  Food Insecurity: Not on file  Transportation Needs: Not on file  Physical Activity: Not on file  Stress: Not on file  Social Connections: Not on file  Intimate Partner Violence: Not on  file    Past Surgical History:  Procedure Laterality Date   breast tumor removed Right 1976   COLONOSCOPY N/A 11/20/2014   Procedure: COLONOSCOPY;  Surgeon: [name], MD;  [facility];  Service: Endoscopy;  Laterality: N/A;  1030 - moved to 5/2 @ 10:30   EUS N/A 05/25/2013   Procedure: ESOPHAGEAL ENDOSCOPIC ULTRASOUND (EUS) RADIAL;  Surgeon: [name], MD;  [facility];  Service: Endoscopy;  Laterality: N/A;   EUS N/A 07/12/2014   EGD by Dr. [name]: small hh, widely patent Schatzki's ring. evidence of prior small gastric wedge resection along body of stomach. no evidence of recurrent tumor.    FINE NEEDLE ASPIRATION N/A 05/25/2013   Procedure: FINE NEEDLE ASPIRATION (FNA) LINEAR;  Surgeon: [name], MD;  [facility];  Service: Endoscopy;  Laterality: N/A;"this part never happend"   LAPAROSCOPIC GASTRECTOMY N/A  07/19/2013   Procedure: RESECTION OF RETROGASTRIC CYSTIC NEOPLASM WITH PARTIAL GASTRECTOMY;  Surgeon: [name], MD;  [facility];  Service: General;  Laterality: N/A;   lipoma removed from back  yrs ago   THYROIDECTOMY Left 01/07/2019   Procedure: Left thyroid lobectomy, with frozen section;  Surgeon: [name], MD;  Location: [place];  Service: ENT;  Laterality: Left;   TUMOR REMOVAL     Benign tumor removed from left breast and back area.     Family History  Problem Relation Age of Onset   Breast cancer Mother    CAD Brother        Premature disease   Colon cancer Neg Hx    Stomach cancer Neg Hx     Allergies  Allergen Reactions   Nicoderm [Nicotine] Rash    Current Outpatient Medications on File Prior to Visit  Medication Sig Dispense Refill   amLODipine (NORVASC) 10 MG tablet Take 1 tablet (10 mg total) by mouth daily. 90 tablet 3   atorvastatin (LIPITOR) 20 MG tablet Take 1 tablet (20 mg total) by mouth daily. 90 tablet 3   buPROPion (WELLBUTRIN XL) 150 MG 24 hr tablet Take 1 tablet (150 mg total) by mouth daily. 90 tablet 0   co-enzyme Q-10 50 MG capsule Take 50 mg by mouth daily.     docusate sodium (COLACE) 100 MG capsule Take 100 mg by mouth daily as needed for mild constipation.     influenza vac split quadrivalent PF (FLUARIX) 0.5 ML injection Inject into the muscle. 0.5 mL 0   losartan (COZAAR) 25 MG tablet TAKE ONE TABLET BY MOUTH ONCE DAILY. 90 tablet 0   metFORMIN (GLUCOPHAGE) 500 MG tablet TAKE 1 TABLET BY MOUTH TWICE A DAY WITH A MEAL. 180 tablet 0   pantoprazole (PROTONIX) 20 MG tablet TAKE (1) TABLET BY MOUTH TWICE DAILY. 180 tablet 0   polyethylene glycol powder (GLYCOLAX/MIRALAX) 17 GM/SCOOP powder Take 17 g by mouth daily as needed.     thiamine (VITAMIN B-1) 100 MG tablet Take 100 mg by mouth daily.     vitamin B-12 (CYANOCOBALAMIN) 1000 MCG tablet Take 1,000 mcg by mouth daily.     fluticasone (FLONASE) 50 MCG/ACT nasal spray Place 2 sprays  into both nostrils daily. (Patient not taking: Reported on 05/30/2022) 16 g 1   No current facility-administered medications on file prior to visit.    BP (!) 158/67 (BP Location: Left Arm, Patient Position: Sitting, Cuff Size: Large)   Pulse 71   Temp 98 F (36.7 C) (Oral)   Resp 18   Ht  $'6\' 1"'t$  (1.854 m)   Wt 222 lb 12.8 oz (101.1 kg)   SpO2 98%   BMI 29.39 kg/m        Objective:   Physical Exam  General Mental Status- Alert. General Appearance- Not in acute distress.   Skin General: Color- Normal Color. Moisture- Normal Moisture.  Neck Carotid Arteries- Normal color. Moisture- Normal Moisture. No carotid bruits. No JVD.  Chest and Lung Exam Auscultation: Breath Sounds:-Normal.  Cardiovascular Auscultation:Rythm- Regular. Murmurs & Other Heart Sounds:Auscultation of the heart reveals- No Murmurs.  Abdomen Inspection:-Inspeection Normal. Palpation/Percussion:Note:No mass. Palpation and Percussion of the abdomen reveal- Non Tender, Non Distended + BS, no rebound or guarding.   Neurologic Cranial Nerve exam:- CN III-XII intact(No nystagmus), symmetric smile. Strength:- 5/5 equal and symmetric strength both upper and lower extremities.       Assessment & Plan:   Patient Instructions  Htn- bp on high side today on recheck. On discussion appears this is without meds. Please take med today and update me on bp readings/daily checks on Monday morning.  Elevated blood sugar- check cmp and A1c. If A1c well controlled can continue just 1 tab metformin.  High cholesterol- continue atorvastatin. Get lipid panel today.  Flu vaccine today.  Smoker- decreasing smoking with wellbutrin. Now under a pack. Keep tapering off. REfilled med today.  Follow up date to be determined after lab review.     Mackie Pai, PA-C

## 2022-05-30 NOTE — Addendum Note (Signed)
Addended by: Sena Hitch on: 05/30/2022 11:36 AM   Modules accepted: Orders

## 2022-06-10 ENCOUNTER — Other Ambulatory Visit: Payer: Self-pay | Admitting: Medical

## 2022-07-11 ENCOUNTER — Inpatient Hospital Stay: Payer: Medicare Other | Attending: Oncology | Admitting: Oncology

## 2022-07-11 VITALS — BP 149/73 | HR 73 | Temp 98.7°F | Resp 18 | Ht 73.0 in | Wt 226.6 lb

## 2022-07-11 DIAGNOSIS — C49A2 Gastrointestinal stromal tumor of stomach: Secondary | ICD-10-CM

## 2022-07-11 DIAGNOSIS — Z8509 Personal history of malignant neoplasm of other digestive organs: Secondary | ICD-10-CM | POA: Diagnosis present

## 2022-07-11 NOTE — Progress Notes (Signed)
[facility] OFFICE PROGRESS NOTE   Diagnosis: Gastrointestinal stromal tumor  INTERVAL HISTORY:   Timothy Hall returns as scheduled.  Feels well.  Good appetite.  No nausea or abdominal pain.  No bleeding.  He continues smoking.  He has decreased cigarette use.  He is followed in the lung cancer screening clinic.  Objective:  Vital signs in last 24 hours:  Blood pressure (!) 149/73, pulse 73, temperature 98.7 F (37.1 C), temperature source Oral, resp. rate 18, height '6\' 1"'$  (1.854 m), weight 226 lb 9.6 oz (102.8 kg), SpO2 100 %.     Lymphatics: No cervical, supraclavicular, axillary, or inguinal nodes Resp: Lungs clear bilaterally, no respiratory distress Cardio: Regular rate and rhythm GI: No mass, nontender, no hepatosplenomegaly Vascular: No leg edema   Lab Results:  Lab Results  Component Value Date   WBC 11.9 (H) 10/30/2021   HGB 14.1 10/30/2021   HCT 43.6 10/30/2021   MCV 89.3 10/30/2021   PLT 336.0 10/30/2021   NEUTROABS 7.8 (H) 10/30/2021    CMP  Lab Results  Component Value Date   NA 138 05/30/2022   K 4.5 05/30/2022   CL 103 05/30/2022   CO2 27 05/30/2022   GLUCOSE 95 05/30/2022   BUN 20 05/30/2022   CREATININE 1.07 05/30/2022   CALCIUM 9.7 05/30/2022   PROT 7.4 05/30/2022   ALBUMIN 4.7 05/30/2022   AST 21 05/30/2022   ALT 41 05/30/2022   ALKPHOS 90 05/30/2022   BILITOT 0.4 05/30/2022   GFRNONAA >60 01/04/2019   GFRAA >60 01/04/2019    Lab Results  Component Value Date   CEA 0.9 05/31/2013    Lab Results  Component Value Date   INR 0.96 07/18/2014   LABPROT 12.9 07/18/2014    Imaging:  No results found.  Medications: I have reviewed the patient's current medications.   Assessment/Plan:   Gastrointestinal stromal tumor stage II (T4 NX), most likely arising from the stomach, status post surgical resection including a partial gastrectomy on 07/19/2013, 19 cm cystic mass with a low mitotic rate. Initiation of adjuvant  Gleevec 08/31/2013.   Gleevec dose reduced to 400 mg every other day beginning 10/18/2013 secondary to neutropenia.   Gleevec dose adjusted to 200 mg daily beginning 11/29/2013.   Gleevec dose adjusted to 300 mg daily beginning 12/13/2013. Upper endoscopy 07/12/2014 without evidence of recurrent tumor in the stomach Gleevec completed February 2018 Mild skin rash most likely secondary to [place]. History of Mild periorbital edema secondary to [place]. Severe neutropenia secondary to [facility] 10/12/2013. Resolved. History of Mild lower extremity edema likely related to [place]. Admission 07/18/2014 with a small bowel obstruction-resolved with bowel rest, CT 07/18/2014 without evidence of recurrent tumor. Follow-up CT 02/16/2015 with no evidence of gastric region mass or wall thickening. 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 Status post evaluation in the emergency department for abdominal pain 05/12/2016. CT with no evidence for acute intra-abdominal or pelvic pathology. Sigmoid colon diverticulosis with no definite surrounding bowel inflammation. Slight decreased size of fat density masslike area within the upper pelvis thought to represent omental infarct; scattered peripheral calcifications now present within the lesion. Admission 07/20/2018 with a recurrent small bowel obstruction, CT negative for evidence of recurrent gastrointestinal stromal tumor     Disposition: Timothy Hall remains in clinical remission from the gastrointestinal stromal tumor.  He would like to continue follow-up at the Cancer center.  He will return for an office visit in 1 year.  He will  continue follow-up in the lung cancer screening clinic.   Betsy Coder, MD  07/11/2022  8:26 AM

## 2022-08-14 ENCOUNTER — Telehealth: Payer: Self-pay | Admitting: Medical

## 2022-08-14 NOTE — Telephone Encounter (Signed)
Copied from [place] [phone]. Topic: Medicare AWV >> Aug 14, 2022 10:44 AM Devoria Glassing wrote: Reason for CRM: Left message for patient to schedule Annual Wellness Visit(AWV).  Please schedule with Health Nurse Advisor at [facility]. Please call [phone] ask for [facility].

## 2022-09-10 ENCOUNTER — Ambulatory Visit (HOSPITAL_BASED_OUTPATIENT_CLINIC_OR_DEPARTMENT_OTHER): Payer: Medicare Other

## 2022-09-10 ENCOUNTER — Other Ambulatory Visit: Payer: Self-pay

## 2022-09-10 DIAGNOSIS — Z87891 Personal history of nicotine dependence: Secondary | ICD-10-CM

## 2022-09-10 DIAGNOSIS — F17210 Nicotine dependence, cigarettes, uncomplicated: Secondary | ICD-10-CM

## 2022-09-27 ENCOUNTER — Other Ambulatory Visit: Payer: Self-pay | Admitting: Medical

## 2022-10-17 ENCOUNTER — Encounter (HOSPITAL_COMMUNITY): Payer: Self-pay

## 2022-10-17 ENCOUNTER — Ambulatory Visit (HOSPITAL_COMMUNITY)
Admission: RE | Admit: 2022-10-17 | Discharge: 2022-10-17 | Disposition: A | Discharge status: Home / Self Care | Payer: Medicare Other | Source: Ambulatory Visit | Attending: Medical | Admitting: Medical

## 2022-10-17 DIAGNOSIS — F17210 Nicotine dependence, cigarettes, uncomplicated: Secondary | ICD-10-CM | POA: Insufficient documentation

## 2022-10-17 DIAGNOSIS — Z87891 Personal history of nicotine dependence: Secondary | ICD-10-CM | POA: Insufficient documentation

## 2022-10-20 ENCOUNTER — Telehealth: Payer: Self-pay | Admitting: *Deleted

## 2022-10-20 DIAGNOSIS — R911 Solitary pulmonary nodule: Secondary | ICD-10-CM

## 2022-10-20 DIAGNOSIS — Z87891 Personal history of nicotine dependence: Secondary | ICD-10-CM

## 2022-10-20 NOTE — Telephone Encounter (Signed)
Spoke with pt's wife (DPR) regarding lung screening CT results. Stable nodules that were unchanged from last scan. One small new nodule that we will look at again with repeat Ct in 6 months. She verbalized understanding. Results/ plans faxed to PCP and Dr Benay Spice. Order placed for 6 mth nodule f/u CT.

## 2022-11-19 ENCOUNTER — Other Ambulatory Visit: Payer: Self-pay | Admitting: Medical

## 2022-11-23 ENCOUNTER — Other Ambulatory Visit: Payer: Self-pay | Admitting: Medical

## 2022-12-02 ENCOUNTER — Telehealth: Payer: Self-pay | Admitting: Medical

## 2022-12-02 NOTE — Telephone Encounter (Signed)
Would you schedule pt for appointment. He is due for one and want to discuss metformin side effects. Also will get labs on that day/

## 2022-12-02 NOTE — Telephone Encounter (Signed)
Pt called and lvm to return call to schedule an appt 

## 2022-12-05 ENCOUNTER — Other Ambulatory Visit: Payer: Self-pay | Admitting: Pharmacist

## 2022-12-05 MED ORDER — METFORMIN HCL ER 500 MG PO TB24: ORAL_TABLET | ORAL | 0 refills | Status: DC

## 2022-12-05 NOTE — Progress Notes (Signed)
Pharmacy Quality Measure Review  This patient is appearing on the insurance-provided list for being at risk of failing the adherence measure for diabetes medications this calendar year.   Medication: metformin 500mg  - twice a day Last fill date: 10/18/2022 - #60 = 30 days  Spoke with patient's wife Raynelle Fanning (patient is hearing impaired). She states he can usually tolerate taking 1 tablet a day for metformin but has GI issues when they try to increase to twice a day. Patient's A1c has been 6.2% in past so he has pre DM.   Recommended trial of Metformin ER to patient and provided approved.   Meds ordered this encounter  Medications   metFORMIN (GLUCOPHAGE-XR) 500 MG 24 hr tablet    Sig: Take 1 tablet daily with largest meal of the day for 7 days, then increase to take 2 tablets daily with largest meal.    Dispense:  180 tablet    Refill:  0   Henrene Pastor, PharmD Clinical Pharmacist [facility]

## 2022-12-29 ENCOUNTER — Other Ambulatory Visit: Payer: Self-pay | Admitting: Medical

## 2022-12-29 ENCOUNTER — Telehealth: Payer: Self-pay

## 2022-12-29 NOTE — Patient Outreach (Signed)
  Care Coordination   Initial Visit Note   12/29/2022 Name: Timothy Hall MRN: [id number] DOB: 02-Apr-1956  Timothy Hall is a 67 y.o. year old male who sees Saguier, Timothy Hall, [place] for primary care. I spoke with wife, Timothy Hall(dpr) by phone today. Per wife, Patient hearing impaired.  What matters to the patients health and wellness today?  Timothy Hall reports patient is doing much better overall and with taking his medications. She reports the change in Metformin has also helped and states patient has not reported any GI upsets. She denies any questions or concerns at this time. Denies any care coordination, resource or disease management needs. Ms. Timothy Hall to contact RNCM if care coordination needs in the future.  Goals Addressed             This Visit's Progress    COMPLETED: Care Coordination Activities       Interventions Today    Flowsheet Row Most Recent Value  Chronic Disease   Chronic disease during today's visit Hypertension (HTN), Other  [GI Stromal tumor]  General Interventions   General Interventions Discussed/Reviewed General Interventions Discussed, Doctor Visits, Durable Medical Equipment (DME)  [encouraged to call RNCM if care coordination needs in the future. Provided contact number]  Doctor Visits Discussed/Reviewed Doctor Visits Discussed, PCP  Durable Medical Equipment (DME) BP Cuff  PCP/Specialist Visits Compliance with follow-up visit  [reviewed upcoming appointments-next appointment with PCP 01/05/23]  Pharmacy Interventions   Pharmacy Dicussed/Reviewed Pharmacy Topics Discussed, Medication Adherence  [discussed medication strategies to fascilitate adherence: setting alarm or pill box organizer that alarms prior to dispensing,  reiterated clinical pharmacist, Timothy Hall, may have suggestions if needed in the future.]            SDOH assessments and interventions completed:  Yes  Care Coordination Interventions:  Yes, provided   Follow up plan: No further  intervention required.   Encounter Outcome:  Pt. Visit Completed   Timothy Sheriff, RN, MSN, BSN, CCM [facility] Care Coordinator [phone]

## 2023-01-05 ENCOUNTER — Encounter: Payer: Self-pay | Admitting: Medical

## 2023-01-05 ENCOUNTER — Ambulatory Visit (INDEPENDENT_AMBULATORY_CARE_PROVIDER_SITE_OTHER): Payer: Medicare Other | Admitting: Medical

## 2023-01-05 VITALS — BP 148/80 | HR 95 | Resp 18 | Ht 73.0 in | Wt 236.6 lb

## 2023-01-05 DIAGNOSIS — R5383 Other fatigue: Secondary | ICD-10-CM

## 2023-01-05 DIAGNOSIS — R110 Nausea: Secondary | ICD-10-CM

## 2023-01-05 DIAGNOSIS — R739 Hyperglycemia, unspecified: Secondary | ICD-10-CM

## 2023-01-05 DIAGNOSIS — R112 Nausea with vomiting, unspecified: Secondary | ICD-10-CM | POA: Diagnosis not present

## 2023-01-05 DIAGNOSIS — E041 Nontoxic single thyroid nodule: Secondary | ICD-10-CM | POA: Diagnosis not present

## 2023-01-05 DIAGNOSIS — K219 Gastro-esophageal reflux disease without esophagitis: Secondary | ICD-10-CM

## 2023-01-05 DIAGNOSIS — E785 Hyperlipidemia, unspecified: Secondary | ICD-10-CM

## 2023-01-05 DIAGNOSIS — I10 Essential (primary) hypertension: Secondary | ICD-10-CM | POA: Diagnosis not present

## 2023-01-05 DIAGNOSIS — H9193 Unspecified hearing loss, bilateral: Secondary | ICD-10-CM

## 2023-01-05 LAB — COMPREHENSIVE METABOLIC PANEL
ALT: 36 U/L (ref 0–53)
AST: 17 U/L (ref 0–37)
Albumin: 4.3 g/dL (ref 3.5–5.2)
Alkaline Phosphatase: 89 U/L (ref 39–117)
BUN: 18 mg/dL (ref 6–23)
CO2: 25 mEq/L (ref 19–32)
Calcium: 9.7 mg/dL (ref 8.4–10.5)
Chloride: 104 mEq/L (ref 96–112)
Creatinine, Ser: 1.07 mg/dL (ref 0.40–1.50)
GFR: 72.41 mL/min (ref >60.00)
Glucose, Bld: 105 mg/dL — ABNORMAL HIGH (ref 70–99)
Potassium: 4.2 mEq/L (ref 3.5–5.1)
Sodium: 138 mEq/L (ref 135–145)
Total Bilirubin: 0.3 mg/dL (ref 0.2–1.2)
Total Protein: 7.1 g/dL (ref 6.0–8.3)

## 2023-01-05 LAB — TSH: TSH: 2.82 u[IU]/mL (ref 0.35–5.50)

## 2023-01-05 LAB — CBC WITH DIFFERENTIAL/PLATELET
Basophils Absolute: 0.1 10*3/uL (ref 0.0–0.1)
Basophils Relative: 0.5 % (ref 0.0–3.0)
Eosinophils Absolute: 0.3 10*3/uL (ref 0.0–0.7)
Eosinophils Relative: 2.9 % (ref 0.0–5.0)
HCT: 40.2 % (ref 39.0–52.0)
Hemoglobin: 13 g/dL (ref 13.0–17.0)
Lymphocytes Relative: 26.7 % (ref 12.0–46.0)
Lymphs Abs: 2.9 10*3/uL (ref 0.7–4.0)
MCHC: 32.3 g/dL (ref 30.0–36.0)
MCV: 88.4 fl (ref 78.0–100.0)
Monocytes Absolute: 0.9 10*3/uL (ref 0.1–1.0)
Monocytes Relative: 8.7 % (ref 3.0–12.0)
Neutro Abs: 6.6 10*3/uL (ref 1.4–7.7)
Neutrophils Relative %: 61.2 % (ref 43.0–77.0)
Platelets: 312 10*3/uL (ref 150.0–400.0)
RBC: 4.55 Mil/uL (ref 4.22–5.81)
RDW: 14.5 % (ref 11.5–15.5)
WBC: 10.9 10*3/uL — ABNORMAL HIGH (ref 4.0–10.5)

## 2023-01-05 LAB — HEMOGLOBIN A1C: Hgb A1c MFr Bld: 6.4 % (ref 4.6–6.5)

## 2023-01-05 LAB — HM DIABETES EYE EXAM

## 2023-01-05 LAB — LIPASE: Lipase: 50 U/L (ref 11.0–59.0)

## 2023-01-05 LAB — T4, FREE: Free T4: 0.81 ng/dL (ref 0.60–1.60)

## 2023-01-05 MED ORDER — FAMOTIDINE 20 MG PO TABS: 20.0000 mg | ORAL_TABLET | Freq: Every day | ORAL | 0 refills | Status: DC

## 2023-01-05 NOTE — Patient Instructions (Signed)
Prediabetes: Last A1c was 6.2 (approximately 125 average for three months). Currently on Metformin XR 500mg  twice daily. No exercise and recent weight gain of 10 pounds. -Check A1c and metabolic panel today. -Encourage low sugar diet, weight control, and exercise. -Continue Metformin XR 500mg  twice daily.  Hyperlipidemia: Currently on Atorvastatin 20mg  daily. -Check lipopanel today. -Continue Atorvastatin 20mg  daily until lab results are reviewed.  Hypertension: Initial blood pressure high at 150/72, but better readings at home. Currently on Losartan 25mg  daily. -Continue Losartan 25mg  daily. -Check blood pressure at home and report readings in one week via MyChart. -If trend is towards 140/90 or above, increase Losartan to 50mg  daily.  Gastroesophageal Reflux Disease (GERD): Currently on Protonix 20mg  twice daily. Experiencing nausea and vomiting 1-2 nights a week, possibly related to diet. -Continue Protonix 20mg  twice daily. -Add Famotidine PRN for flare-ups. -Refer to [facility] for further evaluation. -Check lipase protein today.  History of gastrointestinal stromal tumor: No recent follow-up with GI specialist. -Refer to [facility] for follow-up and possible endoscopy.   also decided to add tsh and t4 for fatigue and removal of portion of thyroid gland years ago.  Follow-up: Determined by lab results. If A1c is in prediabetic range, follow-up in 6 months. If A1c is in diabetic range, follow-up in 3 months.

## 2023-01-05 NOTE — Progress Notes (Signed)
Subjective:    Patient ID: Timothy Hall, male    DOB: 08/12/1955, 67 y.o.   MRN: [id number]  HPI  Discussed the use of AI scribe software for clinical note transcription with the patient, who gave verbal consent to proceed.  History of Present Illness   The patient, with a history of prediabetes, gastrointestinal stromal tumor (GIST), and hyperlipidemia, presents for a routine follow-up. He is currently on Metformin XR 500mg  twice daily, which has not caused any diarrhea. His last HbA1c was 6.2, indicating an average blood glucose level of approximately 125 over the past three months. However, he admits to a lack of exercise and a recent weight gain of 10 pounds, which has resulted in knee pain.  The patient also reports intermittent episodes of nausea and vomiting at night, occurring one to two times per week, which seem to be related to his diet, particularly spicy foods. He is currently on Protonix 20mg  twice daily for GERD, but during episodes of severe reflux, he has been taking additional doses of Protonix.  His blood pressure readings at home range from 126/82 to 135/90, but an initial reading in the office was high at 150/72. He is currently on Losartan 25mg  daily for hypertension.  The patient also has a history of GIST for which a portion of his stomach was removed. He has not been followed up regularly by a GI specialist since his surgery. He has had several endoscopies, the last of which was possibly in 2018.  He is also on Atorvastatin 20mg  daily for hyperlipidemia, and his lipid panel will be checked during this visit to determine if a dose adjustment is needed.       Review of Systems  Constitutional:  Positive for fatigue.  HENT:  Negative for congestion.   Respiratory:  Negative for cough, shortness of breath and wheezing.   Cardiovascular:  Negative for chest pain and palpitations.  Gastrointestinal:  Positive for nausea and vomiting. Negative for abdominal distention  and abdominal pain.       See hpi.  Genitourinary:  Negative for dysuria.  Musculoskeletal:  Negative for back pain and myalgias.  Skin:  Negative for rash.  Neurological:  Negative for dizziness, light-headedness and headaches.  Hematological:  Negative for adenopathy. Does not bruise/bleed easily.  Psychiatric/Behavioral:  Negative for behavioral problems.        Objective:   Physical Exam  General Mental Status- Alert. General Appearance- Not in acute distress.   Skin General: Color- Normal Color. Moisture- Normal Moisture.  Neck Carotid Arteries- Normal color. Moisture- Normal Moisture. No carotid bruits. No JVD.  Chest and Lung Exam Auscultation: Breath Sounds:-Normal.  Cardiovascular Auscultation:Rythm- Regular. Murmurs & Other Heart Sounds:Auscultation of the heart reveals- No Murmurs.  Abdomen Inspection:-Inspeection Normal. Palpation/Percussion:Note:No mass. Palpation and Percussion of the abdomen reveal- Non Tender, Non Distended + BS, no rebound or guarding.   Neurologic Cranial Nerve exam:- CN III-XII intact(No nystagmus), symmetric smile. Strength:- 5/5 equal and symmetric strength both upper and lower extremities.       Assessment & Plan:  Assessment and Plan    Prediabetes: Last A1c was 6.2 (approximately 125 average for three months). Currently on Metformin XR 500mg  twice daily. No exercise and recent weight gain of 10 pounds. -Check A1c and metabolic panel today. -Encourage low sugar diet, weight control, and exercise. -Continue Metformin XR 500mg  twice daily.  Hyperlipidemia: Currently on Atorvastatin 20mg  daily. -Check lipopanel today. -Continue Atorvastatin 20mg  daily until lab results are reviewed.  Hypertension:  Initial blood pressure high at 150/72, but better readings at home. Currently on Losartan 25mg  daily. -Continue Losartan 25mg  daily. -Check blood pressure at home and report readings in one week via MyChart. -If trend is towards  140/90 or above, increase Losartan to 50mg  daily.  Gastroesophageal Reflux Disease (GERD): Currently on Protonix 20mg  twice daily. Experiencing nausea and vomiting 1-2 nights a week, possibly related to diet. -Continue Protonix 20mg  twice daily. -Add Famotidine PRN for flare-ups. -Refer to [facility] for further evaluation. -Check lipase protein today.  History of gastrointestinal stromal tumor: No recent follow-up with GI specialist. -Refer to [facility] for follow-up and possible endoscopy.   also decided to add tsh and t4 for fatigue and removal of portion of thyroid gland years ago.  Follow-up: Determined by lab results. If A1c is in prediabetic range, follow-up in 6 months. If A1c is in diabetic range, follow-up in 3 months.       Esperanza Richters, PA-C

## 2023-01-15 ENCOUNTER — Encounter: Payer: Self-pay | Admitting: Medical

## 2023-01-15 DIAGNOSIS — H918X3 Other specified hearing loss, bilateral: Secondary | ICD-10-CM

## 2023-01-16 NOTE — Addendum Note (Signed)
Addended by: Gwenevere Abbot on: 01/16/2023 09:16 AM   Modules accepted: Orders

## 2023-01-26 ENCOUNTER — Other Ambulatory Visit: Payer: Self-pay | Admitting: Medical

## 2023-01-30 ENCOUNTER — Ambulatory Visit: Payer: Medicare Other | Attending: Medical | Admitting: Audiologist

## 2023-01-30 DIAGNOSIS — H9192 Unspecified hearing loss, left ear: Secondary | ICD-10-CM | POA: Insufficient documentation

## 2023-01-30 DIAGNOSIS — H903 Sensorineural hearing loss, bilateral: Secondary | ICD-10-CM | POA: Insufficient documentation

## 2023-01-30 NOTE — Procedures (Signed)
Outpatient Audiology and [facility] [address] [place], [place]  [address] [phone]  AUDIOLOGICAL  EVALUATION  NAME: Timothy Hall     DOB:   02-15-1956      MRN: [id number]                                                                                     DATE: 01/30/2023     REFERENT: Esperanza Richters, PA-C STATUS: Outpatient DIAGNOSIS: Severe Sensorineural Hearing Loss Bilaterally, Asymmetric Hearing   History: Timothy Hall was seen for an audiological evaluation. Timothy Hall was accompanied to the appointment by his wife. Timothy Hall is receiving a hearing evaluation due to concerns for difficulty hearing. Timothy Hall was provided with a personal amplifier for case history as he could not hear the provider. Timothy Hall has difficulty hearing in both ears for many years. This difficulty began gradually. He used to think the left ear was better but then that switched, he is not sure when. No pain or pressure reported in either ear. Tinnitus present in both ears. Timothy Hall has a history of noise exposure from working on [employer] since he was [age]. He cannot understand people unless he can see their face. Timothy Hall is aware he likely has severe hearing loss and is ready to address it.   Evaluation:  Otoscopy showed a clear view of the tympanic membranes, bilaterally Tympanometry results were consistent with normal middle ear function in the left ear and a flat response in the right ear. Timothy Hall reports he ruptured his right eardrum many years ago. The volume in the right ear is normal, perforation is healed.  Audiometric testing was completed using conventional audiometry with insert transducer. Speech Recognition Thresholds were 40dB in the right ear with live voice slow presentation and 80dB in the left ear. Word Recognition was  44% in the right ear at 80dB and 8% in the left ear at 90dB. Pure tone thresholds show slight sloping to severe sensorineural hearing loss in the right ear and mild sloping  to profound sensorineural hearing loss in the left ear.    Results:  The test results were reviewed with Timothy Hall and his wife. Timothy Hall has normal dropping to sensorineural profound loss in the left ear with no ability to understand speech. He is not a hearing aid candidate in the left ear and would likely need a cochlear implant. The right ear shows mild sloping to severe sensorineural loss with ability to understand speech. Timothy Hall was counseled on reasonable expectations with hearing aids. He was given three handouts, one of local hearing aid providers, local Otolaryngology providers, and a handout on the Timothy Hall Loud Ear personal amplifier to help with communication at home.   Recommendations: Amplification is necessary for the right ear. Timothy Hall may be a cochlear implant candidate for the left ear. Timothy Hall was given a handout of local hearing aid offices. He is not interested in a cochlear implant. Hearing aids can be purchased from a variety of locations. See provided list for locations in the [place] area.  Referral to ENT Physician necessary due to asymmetric hearing loss with no known cause. Timothy Hall, Timothy Dredge, PA-C please refer to Otolaryngology.    43 minutes  spent testing and counseling on results.   Ammie Ferrier  Audiologist, Au.D., CCC-A 01/30/2023  10:36 AM  Cc: Esperanza Richters, PA-C

## 2023-02-06 ENCOUNTER — Encounter: Payer: Self-pay | Admitting: Internal Medicine

## 2023-02-06 ENCOUNTER — Ambulatory Visit (INDEPENDENT_AMBULATORY_CARE_PROVIDER_SITE_OTHER): Payer: Medicare Other | Admitting: Internal Medicine

## 2023-02-06 VITALS — BP 153/70 | HR 85 | Temp 97.8°F | Ht 72.0 in | Wt 235.3 lb

## 2023-02-06 DIAGNOSIS — D490 Neoplasm of unspecified behavior of digestive system: Secondary | ICD-10-CM

## 2023-02-06 DIAGNOSIS — Z1211 Encounter for screening for malignant neoplasm of colon: Secondary | ICD-10-CM

## 2023-02-06 DIAGNOSIS — K219 Gastro-esophageal reflux disease without esophagitis: Secondary | ICD-10-CM | POA: Diagnosis not present

## 2023-02-06 MED ORDER — FAMOTIDINE 20 MG PO TABS
20.0000 mg | ORAL_TABLET | Freq: Every day | ORAL | 11 refills | Status: DC
Start: 2023-02-06 — End: 2024-05-09

## 2023-02-06 MED ORDER — PANTOPRAZOLE SODIUM 20 MG PO TBEC: 20.0000 mg | DELAYED_RELEASE_TABLET | Freq: Two times a day (BID) | ORAL | 11 refills | Status: DC

## 2023-02-06 NOTE — Patient Instructions (Signed)
Continue on Pantoprazole twice daily for your chronic reflux. This medication works best if you take it 30 min before breakfast and dinner. Continue Famotidine nightly before bed.   We will plan on colonoscopy for colon cancer screening purposes next year.   Follow up in 6 months or sooner if needed.   It was very nice meeting both of you today.   Dr. Marletta Lor   Lifestyle and home remedies TO MANAGE REFLUX/HEARTBURN    You may eliminate or reduce the frequency of heartburn by making the following lifestyle changes:   Control your weight. Being overweight is a major risk factor for heartburn and GERD. Excess pounds put pressure on your abdomen, pushing up your stomach and causing acid to back up into your esophagus.    Eat smaller meals. 4 TO 6 MEALS A DAY. This reduces pressure on the lower esophageal sphincter, helping to prevent the valve from opening and acid from washing back into your esophagus.     Loosen your belt. Clothes that fit tightly around your waist put pressure on your abdomen and the lower esophageal sphincter.     Eliminate heartburn triggers. Everyone has specific triggers. Common triggers such as fatty or fried foods, spicy food, tomato sauce, carbonated beverages, alcohol, chocolate, mint, garlic, onion, caffeine and nicotine may make heartburn worse.    Avoid stooping or bending. Tying your shoes is OK. Bending over for longer periods to weed your garden isn't, especially soon after eating.    Don't lie down after a meal. Wait at least three to four hours after eating before going to bed, and don't lie down right after eating.

## 2023-02-06 NOTE — Progress Notes (Signed)
Primary Care Physician:  Timothy Richters, PA-C Primary Gastroenterologist:  Dr. Marletta Lor  Chief Complaint  Patient presents with   Gastroesophageal Reflux    Patient here today due to increase in the gerd symptoms. Patient was on Protonix 20 mg bid and still having bad reflux at night, when this happened he would take an additional 2-3 protonix per day. Patient was told by pcp not to do this and was instructed to use Protonix 20 bid and famotidine 20 mg at bedtime. This is working.     HPI:   Timothy Hall is a 67 y.o. male who presents to the clinic today by referral from his PCP Timothy Hall for evaluation. Timothy Hall snot been seen in our clinic since 2016.  Patient diagnosed with GIST in 2014 after presenting with abnormal CTA. Underwent partial gastrectomy  (Dr. Marlise Eves). EGD by Dr. Willis Modena in December 2015 for follow up regarding GIST showed no evidence of recurrent GIST. Shortly after his EGD patient had admission for small bowel obstruction. CT scan in December showed high-grade small bowel obstruction with transition point near a probable omental infarct, suspect adhesion or internal hernia. Small bowel obstruction resolved on its own. CT which was performed for follow-up of GIST just a couple weeks prior to this admission showed new 8 cm region of marginal stranding along prominent inferior omental adipose tissue. Small bowel draped over this fatty lesion. Felt could represent infarct of omental adipose tissue, less likely tumor deposit along the omentum. Admission 07/20/2018 with a recurrent small bowel obstruction, CT negative for evidence of recurrent gastrointestinal stromal tumor   Completed 3 years of adjuvant Imatinib.   Colonoscopy 5/216 with 1 small hyperplastic polyp, recall 10 years.   Today, states he is doing well. Seeing oncology once a year, currently in remission. Has chronic GERD. Currently taking pantoprazole 20 mg BID previously with nighttime breakthrough  symptoms, added once nighty famotidine and doing much better. No dysphagia/odynophagia. No epigastric or chest pain. Occasional hiccups.   Past Medical History:  Diagnosis Date   Cancer Timothy Hall)    Complication of anesthesia    slow to awaken   Gastric neoplasm    Cystic, 18 cm - recently diagnosed October 2014    GERD (gastroesophageal reflux disease)    Heart murmur age 7   History of alcoholism (HCC)    Quit drinking in 1985   Pneumonia age 80   hx of   Rash    upper back due to gleevac   Sleep apnea    Intolerant of CPAP    Past Surgical History:  Procedure Laterality Date   breast tumor removed Right 1976   COLONOSCOPY N/A 11/20/2014   Procedure: COLONOSCOPY;  Surgeon: Corbin Ade, MD;  Location: [facility];  Service: Endoscopy;  Laterality: N/A;  1030 - moved to 5/2 @ 10:30   EUS N/A 05/25/2013   Procedure: ESOPHAGEAL ENDOSCOPIC ULTRASOUND (EUS) RADIAL;  Surgeon: Willis Modena, MD;  Location: [facility];  Service: Endoscopy;  Laterality: N/A;   EUS N/A 07/12/2014   EGD by Dr. Dulce Sellar: small hh, widely patent Schatzki's ring. evidence of prior small gastric wedge resection along body of stomach. no evidence of recurrent tumor.    FINE NEEDLE ASPIRATION N/A 05/25/2013   Procedure: FINE NEEDLE ASPIRATION (FNA) LINEAR;  Surgeon: Willis Modena, MD;  Location: [facility];  Service: Endoscopy;  Laterality: N/A;"this part never happend"   LAPAROSCOPIC GASTRECTOMY N/A 07/19/2013   Procedure: RESECTION OF RETROGASTRIC CYSTIC NEOPLASM WITH  PARTIAL GASTRECTOMY;  Surgeon: Ernestene Mention, MD;  Location: [facility];  Service: General;  Laterality: N/A;   lipoma removed from back  yrs ago   THYROIDECTOMY Left 01/07/2019   Procedure: Left thyroid lobectomy, with frozen section;  Surgeon: Serena Colonel, MD;  Location: [facility] OR;  Service: ENT;  Laterality: Left;   TUMOR REMOVAL     Benign tumor removed from left breast and back area.     Current Outpatient Medications  Medication Sig  Dispense Refill   amLODipine (NORVASC) 10 MG tablet TAKE 1 TABLET BY MOUTH DAILY. 90 tablet 1   atorvastatin (LIPITOR) 20 MG tablet TAKE ONE TABLET BY MOUTH ONCE DAILY. 90 tablet 1   buPROPion (WELLBUTRIN XL) 150 MG 24 hr tablet Take 1 tablet (150 mg total) by mouth daily. 90 tablet 1   co-enzyme Q-10 50 MG capsule Take 200 mg by mouth daily.     famotidine (PEPCID) 20 MG tablet Take 1 tablet (20 mg total) by mouth daily. (Patient taking differently: Take 20 mg by mouth daily. prn) 30 tablet 0   influenza vac split quadrivalent PF (FLUARIX) 0.5 ML injection Inject into the muscle. 0.5 mL 0   losartan (COZAAR) 25 MG tablet Take 1 tablet (25 mg total) by mouth daily. 90 tablet 3   metFORMIN (GLUCOPHAGE-XR) 500 MG 24 hr tablet Take 1 tablet daily with largest meal of the day for 7 days, then increase to take 2 tablets daily with largest meal. (Patient taking differently: Take 1,000 mg by mouth daily with breakfast.) 180 tablet 0   pantoprazole (PROTONIX) 20 MG tablet TAKE (1) TABLET BY MOUTH TWICE DAILY. 180 tablet 1   polyethylene glycol powder (GLYCOLAX/MIRALAX) 17 GM/SCOOP powder Take 17 g by mouth daily as needed.     vitamin B-12 (CYANOCOBALAMIN) 1000 MCG tablet Take 1,000 mcg by mouth daily.     No current facility-administered medications for this visit.    Allergies as of 02/06/2023 - Review Complete 02/06/2023  Allergen Reaction Noted   Nicoderm [nicotine] Rash 08/04/2013    Family History  Problem Relation Age of Onset   Breast cancer Mother    CAD Brother        Premature disease   Colon cancer Neg Hx    Stomach cancer Neg Hx     Social History   Socioeconomic History   Marital status: Married    Spouse name: Not on file   Number of children: Not on file   Years of education: Not on file   Highest education level: Not on file  Occupational History   Occupation: [employer] as a second language teacher    Employer: [employer]  Tobacco Use   Smoking status: Every Day    Current  packs/day: 2.00    Average packs/day: 2.0 packs/day for 40.0 years (80.0 ttl pk-yrs)    Types: Cigarettes   Smokeless tobacco: Never   Tobacco comments:    a pack and a quarter.   Vaping Use   Vaping status: Never Used  Substance and Sexual Activity   Alcohol use: No    Comment: quit in 1985   Drug use: No    Comment: History of alcoholism   Sexual activity: Yes  Other Topics Concern   Not on file  Social History Narrative   Married, wife Raynelle Fanning   Has #1 grown daughter and a grandson   Has a dog he is close to   Enjoys working on cars and fishing   Social Determinants of Health  Financial Resource Strain: Not on file  Food Insecurity: No Food Insecurity (12/29/2022)   Hunger Vital Sign    Worried About Running Out of Food in the Last Year: Never true    Ran Out of Food in the Last Year: Never true  Transportation Needs: No Transportation Needs (12/29/2022)   PRAPARE - Administrator, Civil Service (Medical): No    Lack of Transportation (Non-Medical): No  Physical Activity: Not on file  Stress: Not on file  Social Connections: Not on file  Intimate Partner Violence: Not on file    Subjective: Review of Systems  Constitutional:  Negative for chills and fever.  HENT:  Negative for congestion and hearing loss.   Eyes:  Negative for blurred vision and double vision.  Respiratory:  Negative for cough and shortness of breath.   Cardiovascular:  Negative for chest pain and palpitations.  Gastrointestinal:  Negative for abdominal pain, blood in stool, constipation, diarrhea, heartburn, melena and vomiting.  Genitourinary:  Negative for dysuria and urgency.  Musculoskeletal:  Negative for joint pain and myalgias.  Skin:  Negative for itching and rash.  Neurological:  Negative for dizziness and headaches.  Psychiatric/Behavioral:  Negative for depression. The patient is not nervous/anxious.        Objective: BP (!) 153/70 (BP Location: Left Arm, Patient  Position: Sitting, Cuff Size: Normal)   Pulse 85   Temp 97.8 F (36.6 C) (Temporal)   Ht 6' (1.829 m)   Wt 235 lb 4.8 oz (106.7 kg)   BMI 31.91 kg/m  Physical Exam Constitutional:      Appearance: Normal appearance.  HENT:     Head: Normocephalic and atraumatic.  Eyes:     Extraocular Movements: Extraocular movements intact.     Conjunctiva/sclera: Conjunctivae normal.  Cardiovascular:     Rate and Rhythm: Normal rate and regular rhythm.  Pulmonary:     Effort: Pulmonary effort is normal.     Breath sounds: Normal breath sounds.  Abdominal:     General: Bowel sounds are normal.     Palpations: Abdomen is soft.     Hernia: A hernia is present.  Musculoskeletal:        General: Normal range of motion.     Cervical back: Normal range of motion and neck supple.  Skin:    General: Skin is warm.  Neurological:     General: No focal deficit present.     Mental Status: He is alert and oriented to person, place, and time.  Psychiatric:        Mood and Affect: Mood normal.        Behavior: Behavior normal.      Assessment/Plan:  Chronic GERD- well controlle don pantoprazole 20 mg BID and famotidine at bedtime. Will continue. Refilled today.   2.   GIST s/p partial gastrectomy and 3 years of adjuvant Imatinib - currently in remission. Continue ot follow up with oncology.   3.    Colon cancer screening - Due for colonoscopy 2025.   4.    Incisional hernia - small, easily reducible. Patient counseled on avoiding heavy lifting or other strenuous exercises/activities. If ever experiences pain with hernia and unable to reduce while lying flat, recommended patient go to ER for urgent evaluation.   Follow up in 6 months or sooner if needed.   Thank you Timothy Hall for the kind referral.   02/06/2023 9:16 AM   Disclaimer: This note was dictated with voice recognition software. Similar  sounding words can inadvertently be transcribed and may not be corrected upon review.

## 2023-02-09 ENCOUNTER — Encounter: Payer: Self-pay | Admitting: Internal Medicine

## 2023-03-03 ENCOUNTER — Encounter: Payer: Self-pay | Admitting: Pharmacist

## 2023-03-03 NOTE — Progress Notes (Signed)
Pharmacy Quality Measure Review  This patient is appearing on a report for being at risk of failing the adherence measure for diabetes medications this calendar year.   Medication: metformin ER 500mg  Last fill date: 12/05/2022 for 90 day supply  Patient will be due to have metformin refilled soon. Called to check to see if patient has tolerated metformin ER better than regular release metformin. LM on VM of patient's wife Raynelle Fanning (patient is hearing impaired)

## 2023-03-14 ENCOUNTER — Other Ambulatory Visit: Payer: Self-pay | Admitting: Medical

## 2023-04-21 ENCOUNTER — Ambulatory Visit (HOSPITAL_BASED_OUTPATIENT_CLINIC_OR_DEPARTMENT_OTHER): Payer: Medicare Other

## 2023-05-05 ENCOUNTER — Ambulatory Visit (HOSPITAL_COMMUNITY)
Admission: RE | Admit: 2023-05-05 | Discharge: 2023-05-05 | Disposition: A | Discharge status: Home / Self Care | Payer: Medicare Other | Source: Ambulatory Visit | Attending: Acute Care | Admitting: Acute Care

## 2023-05-05 DIAGNOSIS — R911 Solitary pulmonary nodule: Secondary | ICD-10-CM | POA: Diagnosis not present

## 2023-05-05 DIAGNOSIS — Z87891 Personal history of nicotine dependence: Secondary | ICD-10-CM | POA: Diagnosis not present

## 2023-05-05 DIAGNOSIS — F17210 Nicotine dependence, cigarettes, uncomplicated: Secondary | ICD-10-CM | POA: Diagnosis not present

## 2023-06-05 ENCOUNTER — Telehealth: Payer: Self-pay | Admitting: Acute Care

## 2023-06-05 DIAGNOSIS — F17210 Nicotine dependence, cigarettes, uncomplicated: Secondary | ICD-10-CM

## 2023-06-05 DIAGNOSIS — I25119 Atherosclerotic heart disease of native coronary artery with unspecified angina pectoris: Secondary | ICD-10-CM

## 2023-06-05 DIAGNOSIS — Z87891 Personal history of nicotine dependence: Secondary | ICD-10-CM

## 2023-06-05 DIAGNOSIS — Z122 Encounter for screening for malignant neoplasm of respiratory organs: Secondary | ICD-10-CM

## 2023-06-05 NOTE — Telephone Encounter (Signed)
I have called the patient's wife with results of his low-dose screening CT.  She manages his healthcare as he is hearing impaired. Screening scan was read as a lung RADS 1 negative study follow-up screening scan will be due in October 2025. There was notation of age advanced coronary artery disease on the scan.  Upon discussing this with patient's wife she told me that patient's brother had triple bypass in his 62s. As there is a significant family history and patient has age advanced disease it may be prudent to go ahead and refer to cardiology for further evaluation. Patient is currently on statin medication, however may benefit from cardiology involvement early on. There was also notation of a nearly isodense nodule off the posterior right thyroid that is 2.8 cm in size.  This was not evident on the ultrasound done 09/07/2018. Per patient wife patient had have his thyroid removed several years ago so there is concern that this may be recurrent disease. Saguier, Ramon Dredge, PA-C , will you please order ultrasound of thyroid and follow-up on this finding? I will place the order for the cardiology consult.. Please do not hesitate to contact me if you have any questions or concerns. Jonita Albee and Jill Side please place order for annual CT screening due in October 2025 and fax results of the scan to PCP. Thanks so much everyone

## 2023-06-06 NOTE — Addendum Note (Signed)
Addended by: Gwenevere Abbot on: 06/06/2023 08:18 AM   Modules accepted: Orders

## 2023-06-08 ENCOUNTER — Other Ambulatory Visit: Payer: Self-pay | Admitting: Medical

## 2023-06-08 NOTE — Telephone Encounter (Signed)
Pt's mobile number was called and lvm to return call

## 2023-06-08 NOTE — Telephone Encounter (Signed)
Annual order placed for LDCT 2025 and results faxed to PCP.

## 2023-06-17 ENCOUNTER — Ambulatory Visit (HOSPITAL_BASED_OUTPATIENT_CLINIC_OR_DEPARTMENT_OTHER)
Admission: RE | Admit: 2023-06-17 | Discharge: 2023-06-17 | Disposition: A | Discharge status: Home / Self Care | Payer: Medicare Other | Source: Ambulatory Visit | Attending: Medical | Admitting: Medical

## 2023-06-17 DIAGNOSIS — E041 Nontoxic single thyroid nodule: Secondary | ICD-10-CM | POA: Insufficient documentation

## 2023-06-17 DIAGNOSIS — E042 Nontoxic multinodular goiter: Secondary | ICD-10-CM | POA: Diagnosis not present

## 2023-06-20 NOTE — Addendum Note (Signed)
Addended by: Gwenevere Abbot on: 06/20/2023 08:39 PM   Modules accepted: Orders

## 2023-06-24 ENCOUNTER — Encounter: Payer: Self-pay | Admitting: Internal Medicine

## 2023-07-10 ENCOUNTER — Inpatient Hospital Stay: Payer: Medicare Other | Attending: Oncology | Admitting: Oncology

## 2023-07-13 ENCOUNTER — Ambulatory Visit (INDEPENDENT_AMBULATORY_CARE_PROVIDER_SITE_OTHER): Payer: Medicare Other | Admitting: Medical

## 2023-07-13 VITALS — BP 140/60 | HR 90 | Temp 98.0°F | Resp 20 | Ht 72.0 in | Wt 241.0 lb

## 2023-07-13 DIAGNOSIS — E039 Hypothyroidism, unspecified: Secondary | ICD-10-CM

## 2023-07-13 DIAGNOSIS — E785 Hyperlipidemia, unspecified: Secondary | ICD-10-CM | POA: Diagnosis not present

## 2023-07-13 DIAGNOSIS — R739 Hyperglycemia, unspecified: Secondary | ICD-10-CM | POA: Diagnosis not present

## 2023-07-13 DIAGNOSIS — H9193 Unspecified hearing loss, bilateral: Secondary | ICD-10-CM

## 2023-07-13 DIAGNOSIS — E669 Obesity, unspecified: Secondary | ICD-10-CM

## 2023-07-13 DIAGNOSIS — I10 Essential (primary) hypertension: Secondary | ICD-10-CM | POA: Diagnosis not present

## 2023-07-13 LAB — TSH: TSH: 2.41 u[IU]/mL (ref 0.35–5.50)

## 2023-07-13 LAB — COMPREHENSIVE METABOLIC PANEL
ALT: 59 U/L — ABNORMAL HIGH (ref 0–53)
AST: 27 U/L (ref 0–37)
Albumin: 4.4 g/dL (ref 3.5–5.2)
Alkaline Phosphatase: 94 U/L (ref 39–117)
BUN: 19 mg/dL (ref 6–23)
CO2: 27 meq/L (ref 19–32)
Calcium: 9.5 mg/dL (ref 8.4–10.5)
Chloride: 104 meq/L (ref 96–112)
Creatinine, Ser: 0.93 mg/dL (ref 0.40–1.50)
GFR: 85.37 mL/min (ref >60.00)
Glucose, Bld: 112 mg/dL — ABNORMAL HIGH (ref 70–99)
Potassium: 4.7 meq/L (ref 3.5–5.1)
Sodium: 139 meq/L (ref 135–145)
Total Bilirubin: 0.3 mg/dL (ref 0.2–1.2)
Total Protein: 6.9 g/dL (ref 6.0–8.3)

## 2023-07-13 LAB — LIPID PANEL
Cholesterol: 140 mg/dL (ref 0–200)
HDL: 40.2 mg/dL (ref >39.00)
LDL Cholesterol: 62 mg/dL (ref 0–99)
NonHDL: 99.51
Total CHOL/HDL Ratio: 3
Triglycerides: 190 mg/dL — ABNORMAL HIGH (ref 0.0–149.0)
VLDL: 38 mg/dL (ref 0.0–40.0)

## 2023-07-13 LAB — T4, FREE: Free T4: 0.84 ng/dL (ref 0.60–1.60)

## 2023-07-13 LAB — HEMOGLOBIN A1C: Hgb A1c MFr Bld: 6.8 % — ABNORMAL HIGH (ref 4.6–6.5)

## 2023-07-13 NOTE — Progress Notes (Signed)
Subjective:    Patient ID: Timothy Hall, male    DOB: [birth year], 67 y.o.   MRN: [id number]  HPI  Discussed the use of AI scribe software for clinical note transcription with the patient, who gave verbal consent to proceed.  History of Present Illness   The patient has history of  thyroid nodule, coronary artery calcification, and prediabetes, presents with concerns about recent weight gain. Over the past year, he has gained approximately 12 pounds. He admits to a diet high in junk food, particularly potato chips and fried foods, and acknowledges that he snacks throughout the day. He consumes two main meals daily, with snacking in between. No pedal edema and no orthopnes.  He has a history of left thyroid lobe removal in 2020. A recent lung cancer screening revealed a nodule in the right thyroid lobe. He is scheduled to see an ENT specialist and a cardiologist in January.  The patient also reports issues with hearing, with no bone conduction in the left ear and a hearing aid recommended for the right ear. However, there are current insurance issues delaying this.  Regarding his prediabetes, he is tolerating Metformin XR 500mg  twice daily. His reflux is well-controlled with Protonix 20mg  twice daily and Famotidine as needed. He is also on Torastatin 10mg  daily for high cholesterol.  The patient has significantly reduced his smoking habit from almost three packs a day to less than one pack a day with the help of Wellbutrin. His blood pressure at home is usually in the range of 126-131/70-76, while in the clinic it was recorded as 140/60. He is currently on Amlodipine 10mg  and Losartan 25mg  daily for blood pressure control.               Review of Systems  Constitutional:  Negative for chills and fatigue.  HENT:         Decreased hearing.  Respiratory:  Negative for choking, chest tightness and wheezing.   Cardiovascular:  Negative for chest pain and palpitations.  Gastrointestinal:   Negative for abdominal pain.  Genitourinary:  Negative for dysuria and flank pain.  Musculoskeletal:  Negative for back pain, joint swelling and neck stiffness.  Skin:  Negative for rash.  Neurological:  Negative for dizziness, speech difficulty, weakness and light-headedness.  Hematological:  Negative for adenopathy. Does not bruise/bleed easily.  Psychiatric/Behavioral:  Negative for behavioral problems, hallucinations and suicidal ideas.     Past Medical History:  Diagnosis Date   Cancer (HCC)    Complication of anesthesia    slow to awaken   Gastric neoplasm    Cystic, 18 cm - recently diagnosed October 2014    GERD (gastroesophageal reflux disease)    Heart murmur age 89   History of alcoholism (HCC)    Quit drinking in 1985   Pneumonia age 89   hx of   Rash    upper back due to gleevac   Sleep apnea    Intolerant of CPAP     Social History   Socioeconomic History   Marital status: Married    Spouse name: Not on file   Number of children: Not on file   Years of education: Not on file   Highest education level: Not on file  Occupational History   Occupation: [employer] as a second language teacher    Employer: [employer]  Tobacco Use   Smoking status: Every Day    Current packs/day: 2.00    Average packs/day: 2.0 packs/day for 40.0 years (80.0 ttl  pk-yrs)    Types: Cigarettes   Smokeless tobacco: Never   Tobacco comments:    a pack and a quarter.   Vaping Use   Vaping status: Never Used  Substance and Sexual Activity   Alcohol use: No    Comment: quit in 1985   Drug use: No    Comment: History of alcoholism   Sexual activity: Yes  Other Topics Concern   Not on file  Social History Narrative   Married, wife Raynelle Fanning   Has #1 grown daughter and a grandson   Has a dog he is close to   Enjoys working on cars and fishing   Social Drivers of Corporate investment banker Strain: Not on file  Food Insecurity: No Food Insecurity (12/29/2022)   Hunger Vital Sign     Worried About Running Out of Food in the Last Year: Never true    Ran Out of Food in the Last Year: Never true  Transportation Needs: No Transportation Needs (12/29/2022)   PRAPARE - Administrator, Civil Service (Medical): No    Lack of Transportation (Non-Medical): No  Physical Activity: Not on file  Stress: Not on file  Social Connections: Not on file  Intimate Partner Violence: Not on file    Past Surgical History:  Procedure Laterality Date   breast tumor removed Right 1976   COLONOSCOPY N/A 11/20/2014   Procedure: COLONOSCOPY;  Surgeon: Corbin Ade, MD;  [facility]: [facility];  Service: Endoscopy;  Laterality: N/A;  1030 - moved to 5/2 @ 10:30   EUS N/A 05/25/2013   Procedure: ESOPHAGEAL ENDOSCOPIC ULTRASOUND (EUS) RADIAL;  Surgeon: Willis Modena, MD;  Location: [facility];  Service: Endoscopy;  Laterality: N/A;   EUS N/A 07/12/2014   EGD by Dr. Dulce Sellar: small hh, widely patent Schatzki's ring. evidence of prior small gastric wedge resection along body of stomach. no evidence of recurrent tumor.    FINE NEEDLE ASPIRATION N/A 05/25/2013   Procedure: FINE NEEDLE ASPIRATION (FNA) LINEAR;  Surgeon: Willis Modena, MD;  Location: [facility];  Service: Endoscopy;  Laterality: N/A;"this part never happend"   LAPAROSCOPIC GASTRECTOMY N/A 07/19/2013   Procedure: RESECTION OF RETROGASTRIC CYSTIC NEOPLASM WITH PARTIAL GASTRECTOMY;  Surgeon: Ernestene Mention, MD;  Location: [facility];  Service: General;  Laterality: N/A;   lipoma removed from back  yrs ago   THYROIDECTOMY Left 01/07/2019   Procedure: Left thyroid lobectomy, with frozen section;  Surgeon: Serena Colonel, MD;  Location: [facility] OR;  Service: ENT;  Laterality: Left;   TUMOR REMOVAL     Benign tumor removed from left breast and back area.     Family History  Problem Relation Age of Onset   Breast cancer Mother    CAD Brother        Premature disease   Colon cancer Neg Hx    Stomach cancer Neg Hx     Allergies   Allergen Reactions   Nicoderm [Nicotine] Rash    Current Outpatient Medications on File Prior to Visit  Medication Sig Dispense Refill   amLODipine (NORVASC) 10 MG tablet TAKE 1 TABLET BY MOUTH DAILY. 90 tablet 1   atorvastatin (LIPITOR) 20 MG tablet TAKE ONE TABLET BY MOUTH ONCE DAILY. 90 tablet 1   buPROPion (WELLBUTRIN XL) 150 MG 24 hr tablet Take 1 tablet (150 mg total) by mouth daily. 90 tablet 1   co-enzyme Q-10 50 MG capsule Take 200 mg by mouth daily.     famotidine (PEPCID)  20 MG tablet Take 1 tablet (20 mg total) by mouth at bedtime. 30 tablet 11   influenza vac split quadrivalent PF (FLUARIX) 0.5 ML injection Inject into the muscle. 0.5 mL 0   losartan (COZAAR) 25 MG tablet Take 1 tablet (25 mg total) by mouth daily. 90 tablet 3   metFORMIN (GLUCOPHAGE-XR) 500 MG 24 hr tablet TAKE 1 TABLET DAILY WITHLARGEST MEAL OF DAY FOR 7 DAYS,THEN TAKE 2 TABLETS DAILY WITH LARGEST MEAL 180 tablet 0   pantoprazole (PROTONIX) 20 MG tablet Take 1 tablet (20 mg total) by mouth 2 (two) times daily before a meal. 60 tablet 11   polyethylene glycol powder (GLYCOLAX/MIRALAX) 17 GM/SCOOP powder Take 17 g by mouth daily as needed.     vitamin B-12 (CYANOCOBALAMIN) 1000 MCG tablet Take 1,000 mcg by mouth daily.     No current facility-administered medications on file prior to visit.    BP (!) 140/60   Pulse 90   Temp 98 F (36.7 C)   Resp 20   Ht 6' (1.829 m)   Wt 241 lb (109.3 kg)   SpO2 99%   BMI 32.69 kg/m        Objective:   Physical Exam  General Mental Status- Alert. General Appearance- Not in acute distress.   Skin General: Color- Normal Color. Moisture- Normal Moisture.  Neck Carotid Arteries- Normal color. Moisture- Normal Moisture. No carotid bruits. No JVD.  Chest and Lung Exam Auscultation: Breath Sounds:-Normal.  Cardiovascular Auscultation:Rythm- Regular. Murmurs & Other Heart Sounds:Auscultation of the heart reveals- No  Murmurs.  Abdomen Inspection:-Inspeection Normal. Palpation/Percussion:Note:No mass. Palpation and Percussion of the abdomen reveal- Non Tender, Non Distended + BS, no rebound or guarding.   Neurologic Cranial Nerve exam:- CN III-XII intact(No nystagmus), symmetric smile. Strength:- 5/5 equal and symmetric strength both upper and lower extremities.   Lower ext- no pedal edema. Calfs symmetric. Negative homans signs.     Assessment & Plan:   Assessment and Plan    Weight Gain Gradual increase of 12 pounds over the past year. Poor diet with frequent snacking and large portion sizes. Limited physical activity due to discomfort in  rtcalf for years(work injury 2.5 years ago), possibly related to inappropriate footwear. -Encouraged portion control, healthier food choices, and regular exercise. -Advised to wear appropriate footwear for walking.  Thyroid Nodule History of left thyroid lobe removal in 2020. Recent incidental finding of a nodule in the right thyroid lobe during lung cancer screening. Previous TSH and T4 were normal. -Order TSH and T4 today to assess thyroid function.  Advanced Coronary Artery Calcification Incidental finding on recent lung cancer screening. -Cardiology appointment scheduled with Dr. Neldon Labella in January. Continue current statin  Hearing Loss Left ear with no bone conduction, right ear awaiting hearing aid due to insurance issues. -Encouraged to continue pursuing hearing aid for right ear.  Gastroesophageal Reflux Disease (GERD) Well controlled on Protonix 20mg  twice daily and as-needed Famotidine. -Continue current regimen.  Hyperlipidemia On Atorvastatin 10mg  daily. -Order lipid panel today to assess control.  Prediabetes On Metformin XR 500mg  twice daily. Last A1C was 6.4. -Order A1C and metabolic panel today to assess control.  Hypertension On Amlodipine 10mg  daily and Losartan 25mg  daily. Home readings usually 126-131 systolic, 70-76  diastolic. -Continue current regimen. Consider increasing Losartan if BP trends upward.  Tobacco Use Decreased from nearly 3 packs/day to less than 1 pack/day with the help of Wellbutrin. -Encouraged to continue efforts to quit smoking.   Follow up date to be  determined after lab review.        Esperanza Richters, PA-C

## 2023-07-13 NOTE — Patient Instructions (Signed)
Weight Gain Gradual increase of 12 pounds over the past year. Poor diet with frequent snacking and large portion sizes. Limited physical activity due to discomfort in  rtcalf for years(work injury 2.5 years ago), possibly related to inappropriate footwear. -Encouraged portion control, healthier food choices, and regular exercise. -Advised to wear appropriate footwear for walking.  Thyroid Nodule History of left thyroid lobe removal in 2020. Recent incidental finding of a nodule in the right thyroid lobe during lung cancer screening. Previous TSH and T4 were normal. -Order TSH and T4 today to assess thyroid function.  Advanced Coronary Artery Calcification Incidental finding on recent lung cancer screening. -Cardiology appointment scheduled with Dr. Neldon Labella in January. Continue current statin  Hearing Loss Left ear with no bone conduction, right ear awaiting hearing aid due to insurance issues. -Encouraged to continue pursuing hearing aid for right ear.  Gastroesophageal Reflux Disease (GERD) Well controlled on Protonix 20mg  twice daily and as-needed Famotidine. -Continue current regimen.  Hyperlipidemia On Atorvastatin 10mg  daily. -Order lipid panel today to assess control.  Prediabetes On Metformin XR 500mg  twice daily. Last A1C was 6.4. -Order A1C and metabolic panel today to assess control.  Hypertension On Amlodipine 10mg  daily and Losartan 25mg  daily. Home readings usually 126-131 systolic, 70-76 diastolic. -Continue current regimen. Consider increasing Losartan if BP trends upward.  Tobacco Use Decreased from nearly 3 packs/day to less than 1 pack/day with the help of Wellbutrin. -Encouraged to continue efforts to quit smoking.   Follow up date to be determined after lab review.

## 2023-07-19 ENCOUNTER — Other Ambulatory Visit: Payer: Self-pay | Admitting: Medical

## 2023-07-20 MED ORDER — METFORMIN HCL ER 500 MG PO TB24: 500.0000 mg | ORAL_TABLET | Freq: Two times a day (BID) | ORAL | 0 refills | Status: DC

## 2023-07-23 ENCOUNTER — Telehealth: Payer: Self-pay | Admitting: *Deleted

## 2023-07-23 NOTE — Telephone Encounter (Signed)
LMOM for pt to schedule AWV. 

## 2023-07-25 ENCOUNTER — Other Ambulatory Visit: Payer: Self-pay | Admitting: Medical

## 2023-07-27 DIAGNOSIS — F17200 Nicotine dependence, unspecified, uncomplicated: Secondary | ICD-10-CM | POA: Diagnosis not present

## 2023-07-27 DIAGNOSIS — E041 Nontoxic single thyroid nodule: Secondary | ICD-10-CM | POA: Diagnosis not present

## 2023-07-28 ENCOUNTER — Other Ambulatory Visit: Payer: Self-pay | Admitting: Otolaryngology

## 2023-07-28 DIAGNOSIS — E041 Nontoxic single thyroid nodule: Secondary | ICD-10-CM

## 2023-08-06 ENCOUNTER — Ambulatory Visit: Payer: Medicare Other | Attending: Internal Medicine | Admitting: Internal Medicine

## 2023-08-06 ENCOUNTER — Encounter: Payer: Self-pay | Admitting: Internal Medicine

## 2023-08-06 VITALS — BP 130/74 | HR 77 | Ht 72.0 in | Wt 242.6 lb

## 2023-08-06 DIAGNOSIS — I10 Essential (primary) hypertension: Secondary | ICD-10-CM | POA: Diagnosis not present

## 2023-08-06 DIAGNOSIS — I2510 Atherosclerotic heart disease of native coronary artery without angina pectoris: Secondary | ICD-10-CM | POA: Diagnosis not present

## 2023-08-06 DIAGNOSIS — E785 Hyperlipidemia, unspecified: Secondary | ICD-10-CM | POA: Insufficient documentation

## 2023-08-06 DIAGNOSIS — E782 Mixed hyperlipidemia: Secondary | ICD-10-CM

## 2023-08-06 NOTE — Patient Instructions (Signed)
Medication Instructions:  Your physician recommends that you continue on your current medications as directed. Please refer to the Current Medication list given to you today.   Labwork: None  Testing/Procedures: None  Follow-Up: Your physician recommends that you schedule a follow-up appointment in: 1 year. You will receive a reminder call in about months reminding you to schedule your appointment. If you don't receive this call, please contact our office.   Any Other Special Instructions Will Be Listed Below (If Applicable).  Thank you for choosing [facility]!      If you need a refill on your cardiac medications before your next appointment, please call your pharmacy.

## 2023-08-06 NOTE — Progress Notes (Signed)
Cardiology Office Note  Date: 08/06/2023   ID: Timothy Hall, DOB 1956/05/18, MRN [id number]  PCP:  Esperanza Richters, PA-C  Cardiologist:  Marjo Bicker, MD Electrophysiologist:  None   History of Present Illness: Timothy Hall is a 68 y.o. male known to have GERD, OSA intolerant to CPAP was referred to cardiology clinic for evaluation of imaging evidence of coronary calcifications.  CT chest lung cancer screening in October 2024 showed age advanced calcifications of the coronary arteries.  He is asymptomatic, no angina, DOE, orthopnea, PND or leg swelling.  No dizziness, syncope.  No palpitations.  He is not much active at home.  Plays video games.  Eats junk food.  His wife is trying to make him more active.  Past Medical History:  Diagnosis Date   Cancer [facility])    Complication of anesthesia    slow to awaken   Gastric neoplasm    Cystic, 18 cm - recently diagnosed October 2014    GERD (gastroesophageal reflux disease)    Heart murmur age 46   History of alcoholism (HCC)    Quit drinking in 1985   Pneumonia age 68   hx of   Rash    upper back due to gleevac   Sleep apnea    Intolerant of CPAP    Past Surgical History:  Procedure Laterality Date   breast tumor removed Right 1976   COLONOSCOPY N/A 11/20/2014   Procedure: COLONOSCOPY;  Surgeon: Corbin Ade, MD;  Location: [facility];  Service: Endoscopy;  Laterality: N/A;  1030 - moved to 5/2 @ 10:30   EUS N/A 05/25/2013   Procedure: ESOPHAGEAL ENDOSCOPIC ULTRASOUND (EUS) RADIAL;  Surgeon: Willis Modena, MD;  Location: [facility] ENDOSCOPY;  Service: Endoscopy;  Laterality: N/A;   EUS N/A 07/12/2014   EGD by Dr. Dulce Sellar: small hh, widely patent Schatzki's ring. evidence of prior small gastric wedge resection along body of stomach. no evidence of recurrent tumor.    FINE NEEDLE ASPIRATION N/A 05/25/2013   Procedure: FINE NEEDLE ASPIRATION (FNA) LINEAR;  Surgeon: Willis Modena, MD;  Location: [facility] ENDOSCOPY;  Service:  Endoscopy;  Laterality: N/A;"this part never happend"   LAPAROSCOPIC GASTRECTOMY N/A 07/19/2013   Procedure: RESECTION OF RETROGASTRIC CYSTIC NEOPLASM WITH PARTIAL GASTRECTOMY;  Surgeon: Ernestene Mention, MD;  Location: [facility] ORS;  Service: General;  Laterality: N/A;   lipoma removed from back  yrs ago   THYROIDECTOMY Left 01/07/2019   Procedure: Left thyroid lobectomy, with frozen section;  Surgeon: Serena Colonel, MD;  Location: [facility] OR;  Service: ENT;  Laterality: Left;   TUMOR REMOVAL     Benign tumor removed from left breast and back area.     Current Outpatient Medications  Medication Sig Dispense Refill   amLODipine (NORVASC) 10 MG tablet TAKE 1 TABLET BY MOUTH DAILY. 90 tablet 1   atorvastatin (LIPITOR) 20 MG tablet TAKE ONE TABLET BY MOUTH ONCE DAILY. 90 tablet 1   buPROPion (WELLBUTRIN XL) 150 MG 24 hr tablet TAKE (1) TABLET BY MOUTH ONCE DAILY. 90 tablet 1   co-enzyme Q-10 50 MG capsule Take 200 mg by mouth daily.     famotidine (PEPCID) 20 MG tablet Take 1 tablet (20 mg total) by mouth at bedtime. 30 tablet 11   influenza vac split quadrivalent PF (FLUARIX) 0.5 ML injection Inject into the muscle. 0.5 mL 0   losartan (COZAAR) 25 MG tablet TAKE ONE TABLET BY MOUTH ONCE DAILY. 90 tablet 3   metFORMIN (GLUCOPHAGE-XR)  500 MG 24 hr tablet Take 1 tablet (500 mg total) by mouth 2 (two) times daily with a meal. 180 tablet 0   pantoprazole (PROTONIX) 20 MG tablet Take 1 tablet (20 mg total) by mouth 2 (two) times daily before a meal. 60 tablet 11   polyethylene glycol powder (GLYCOLAX/MIRALAX) 17 GM/SCOOP powder Take 17 g by mouth daily as needed.     vitamin B-12 (CYANOCOBALAMIN) 1000 MCG tablet Take 1,000 mcg by mouth daily.     No current facility-administered medications for this visit.   Allergies:  Nicoderm [nicotine]   Social History: The patient  reports that he has been smoking cigarettes. He has a 80 pack-year smoking history. He has never used smokeless tobacco. He reports that he  does not drink alcohol and does not use drugs.   Family History: The patient's family history includes Breast cancer in his mother; CAD in his brother.   ROS:  Please see the history of present illness. Otherwise, complete review of systems is positive for none  All other systems are reviewed and negative.   Physical Exam: VS:  BP 130/74 (Cuff Size: Normal)   Pulse 77   Ht 6' (1.829 m)   Wt 242 lb 9.6 oz (110 kg)   SpO2 98%   BMI 32.90 kg/m , BMI Body mass index is 32.9 kg/m.  Wt Readings from Last 3 Encounters:  08/06/23 242 lb 9.6 oz (110 kg)  07/13/23 241 lb (109.3 kg)  02/06/23 235 lb 4.8 oz (106.7 kg)    General: Patient appears comfortable at rest. HEENT: Conjunctiva and lids normal, oropharynx clear with moist mucosa. Neck: Supple, no elevated JVP or carotid bruits, no thyromegaly. Lungs: Clear to auscultation, nonlabored breathing at rest. Cardiac: Regular rate and rhythm, no S3 or significant systolic murmur, no pericardial rub. Abdomen: Soft, nontender, no hepatomegaly, bowel sounds present, no guarding or rebound. Extremities: No pitting edema, distal pulses 2+. Skin: Warm and dry. Musculoskeletal: No kyphosis. Neuropsychiatric: Alert and oriented x3, affect grossly appropriate.  Recent Labwork: 01/05/2023: Hemoglobin 13.0; Platelets 312.0 07/13/2023: ALT 59; AST 27; BUN 19; Creatinine, Ser 0.93; Potassium 4.7; Sodium 139; TSH 2.41     Component Value Date/Time   CHOL 140 07/13/2023 1002   TRIG 190.0 (H) 07/13/2023 1002   HDL 40.20 07/13/2023 1002   CHOLHDL 3 07/13/2023 1002   VLDL 38.0 07/13/2023 1002   LDLCALC 62 07/13/2023 1002   LDLDIRECT 112.0 05/30/2022 1112     Assessment and Plan:  Imaging evidence of coronary calcifications HTN, controlled HLD, at goal Hypertriglyceridemia, not at goal Nicotine abuse   -CT chest without contrast from October 2024 showed age advanced coronary calcifications.  Denies having any DOE or angina.  No need of stress  testing at this time.  Start aspirin 81 mg once daily and continue atorvastatin 20 mg nightly.  Lipid panel from 06/2023 reviewed, LDL 62, normal, TG mildly elevated, 190.  Goal LDL less than 100 and goal TG less than 150.  He eats a lot of junk food, strongly encouraged to him to have heart healthy diet.  He verbalized understanding. -Blood pressures are controlled.  Continue current antihypertensives, amlodipine 10 mg once daily and losartan 25 mg once daily. -Smoking cessation counseling provided.  Currently smokes 12 cigarettes/day.  On Wellbutrin.  Managed by PCP.       Medication Adjustments/Labs and Tests Ordered: Current medicines are reviewed at length with the patient today.  Concerns regarding medicines are outlined above.  Disposition:  Follow up  1 year (patient prefers to follow-up every 1 year with cardiology)  Signed Lashunta Frieden Verne Spurr, MD, 08/06/2023 9:36 AM    [facility] [address] [place], [place], [place] [address]

## 2023-08-11 ENCOUNTER — Inpatient Hospital Stay: Payer: Medicare Other | Attending: Oncology | Admitting: Oncology

## 2023-08-11 ENCOUNTER — Inpatient Hospital Stay: Payer: Medicare Other

## 2023-08-11 VITALS — BP 143/76 | HR 62 | Temp 98.1°F | Resp 18 | Ht 72.0 in | Wt 240.7 lb

## 2023-08-11 DIAGNOSIS — Z23 Encounter for immunization: Secondary | ICD-10-CM | POA: Diagnosis not present

## 2023-08-11 DIAGNOSIS — C49A2 Gastrointestinal stromal tumor of stomach: Secondary | ICD-10-CM

## 2023-08-11 MED ORDER — INFLUENZA VAC A&B SURF ANT ADJ 0.5 ML IM SUSY
0.5000 mL | PREFILLED_SYRINGE | Freq: Once | INTRAMUSCULAR | Status: AC
  Administered 2023-08-11: 0.5 mL via INTRAMUSCULAR
  Filled 2023-08-11: qty 0.5

## 2023-08-11 NOTE — Progress Notes (Signed)
[facility] OFFICE PROGRESS NOTE   Diagnosis: Gastrointestinal stromal tumor  INTERVAL HISTORY:   Timothy Howery returns as scheduled.  He is here with his wife.  He feels well.  Good appetite.  No difficulty with bowel function.  No bleeding.  He has severe hearing loss and is working on getting a hearing aid.  He has decreased smoking to approximately 13 cigarettes/day.  He continues follow-up in the lung cancer screening clinic.  Chest CT in October revealed a right thyroid nodule.  An ultrasound 06/17/2023 confirmed multiple right thyroid nodules.  Biopsy of a right lower nodule was recommended.  Ms. Galiza reports this is being scheduled.  Objective:  Vital signs in last 24 hours:  Blood pressure (!) 143/76, pulse 62, temperature 98.1 F (36.7 C), temperature source Temporal, resp. rate 18, height 6' (1.829 m), weight 240 lb 11.2 oz (109.2 kg), SpO2 100%.     Lymphatics: No cervical, supraclavicular, axillary, or inguinal nodes Resp: Breath sounds with a prolonged expiratory phase, no respiratory distress Cardio: Regular rate and rhythm GI: Obese, no apparent ascites, no mass, no hepatosplenomegaly, nontender Vascular: No leg edema   Lab Results:  Lab Results  Component Value Date   WBC 10.9 (H) 01/05/2023   HGB 13.0 01/05/2023   HCT 40.2 01/05/2023   MCV 88.4 01/05/2023   PLT 312.0 01/05/2023   NEUTROABS 6.6 01/05/2023    CMP  Lab Results  Component Value Date   NA 139 07/13/2023   K 4.7 07/13/2023   CL 104 07/13/2023   CO2 27 07/13/2023   GLUCOSE 112 (H) 07/13/2023   BUN 19 07/13/2023   CREATININE 0.93 07/13/2023   CALCIUM 9.5 07/13/2023   PROT 6.9 07/13/2023   ALBUMIN 4.4 07/13/2023   AST 27 07/13/2023   ALT 59 (H) 07/13/2023   ALKPHOS 94 07/13/2023   BILITOT 0.3 07/13/2023   GFRNONAA >60 01/04/2019   GFRAA >60 01/04/2019    Lab Results  Component Value Date   CEA 0.9 05/31/2013     Medications: I have reviewed the patient's current  medications.   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 Initiation of adjuvant Gleevec 08/31/2013.   Gleevec dose reduced to 400 mg every other day beginning 10/18/2013 secondary to neutropenia.   Gleevec dose adjusted to 200 mg daily beginning 11/29/2013.   Gleevec dose adjusted to 300 mg daily beginning 12/13/2013. Upper endoscopy 07/12/2014 without evidence of recurrent tumor in the stomach Gleevec completed February 2018 Mild skin rash most likely secondary to Gleevec. History of Mild periorbital edema secondary to Gleevec. Severe neutropenia secondary to [facility] 10/12/2013. Resolved. History of Mild lower extremity edema likely related to Gleevec. Admission 07/18/2014 with a small bowel obstruction-resolved with bowel rest, CT 07/18/2014 without evidence of recurrent tumor. Follow-up CT 02/16/2015 with no evidence of gastric region mass or wall thickening. 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 Status post evaluation in the emergency department for abdominal pain 05/12/2016. CT with no evidence for acute intra-abdominal or pelvic pathology. Sigmoid colon diverticulosis with no definite surrounding bowel inflammation. Slight decreased size of fat density masslike area within the upper pelvis thought to represent omental infarct; scattered peripheral calcifications now present within the lesion. Admission 07/20/2018 with a recurrent small bowel obstruction, CT negative for evidence of recurrent gastrointestinal stromal tumor Right thyroid nodules  on ultrasound 06/17/2023: Biopsy of a right thyroid nodule recommended       Disposition: Timothy. Hall remains in clinical remission from the gastrointestinal stromal tumor.  He would like to continue follow-up with the Cancer center.  He  will return for an office visit in 1 year.  I encouraged him to continue work on discontinuing smoking.  He will continue follow-up with the lung cancer screening clinic.  He will follow-up as recommended for the thyroid nodule biopsy.  He received an influenza vaccine today.  Thornton Papas, MD  08/11/2023  9:00 AM

## 2023-08-18 ENCOUNTER — Ambulatory Visit
Admission: RE | Admit: 2023-08-18 | Discharge: 2023-08-18 | Disposition: A | Discharge status: Home / Self Care | Payer: Medicare Other | Source: Ambulatory Visit | Attending: Otolaryngology | Admitting: Otolaryngology

## 2023-08-18 ENCOUNTER — Other Ambulatory Visit (HOSPITAL_COMMUNITY)
Admission: RE | Admit: 2023-08-18 | Discharge: 2023-08-18 | Disposition: A | Discharge status: Home / Self Care | Payer: Medicare Other | Source: Ambulatory Visit | Attending: Interventional Radiology | Admitting: Interventional Radiology

## 2023-08-18 DIAGNOSIS — D34 Benign neoplasm of thyroid gland: Secondary | ICD-10-CM | POA: Diagnosis not present

## 2023-08-18 DIAGNOSIS — E041 Nontoxic single thyroid nodule: Secondary | ICD-10-CM | POA: Diagnosis not present

## 2023-08-19 LAB — CYTOLOGY - NON PAP

## 2023-10-24 ENCOUNTER — Other Ambulatory Visit: Payer: Self-pay | Admitting: Medical

## 2023-10-28 ENCOUNTER — Other Ambulatory Visit: Payer: Self-pay | Admitting: Medical

## 2023-12-22 ENCOUNTER — Other Ambulatory Visit: Payer: Self-pay | Admitting: Medical

## 2024-01-15 DIAGNOSIS — H905 Unspecified sensorineural hearing loss: Secondary | ICD-10-CM | POA: Diagnosis not present

## 2024-02-03 ENCOUNTER — Other Ambulatory Visit: Payer: Self-pay | Admitting: Medical

## 2024-02-10 ENCOUNTER — Ambulatory Visit: Payer: Self-pay

## 2024-02-10 ENCOUNTER — Encounter: Payer: Self-pay | Admitting: Medical

## 2024-02-10 NOTE — Telephone Encounter (Signed)
 FYI. Pt scheduled to see you on 02/18/24.

## 2024-02-10 NOTE — Telephone Encounter (Signed)
 FYI Only or Action Required?: Action required by provider: request for appointment.  Patient was last seen in primary care on 07/13/2023 by Dorina Loving, PA-C.  Called Nurse Triage reporting Allergic Reaction.  Symptoms began yesterday.  Interventions attempted: OTC medications: benadryl  and claritin.  Symptoms are: stable.  Triage Disposition: See PCP Within 2 Weeks  Patient/caregiver understands and will follow disposition?: YesCopied from CRM #[id number]. Topic: Clinical - Red Word Triage >> Feb 10, 2024 11:55 AM Jayma L wrote: Red Word that prompted transfer to Nurse Triage: patients wife said josephus stopped eating beef and pork back in February, yesterday he had [employer] hamburger and it made him really sick. He had diarrhea, hands were itchy and red, was throwing up and throat was breaking out and itchy as well. Patients wife asked him to go to hospital and he refused Reason for Disposition  [1] Hives has occurred 3 or more times in the last year AND [2] the cause was not found  Answer Assessment - Initial Assessment Questions 1. APPEARANCE: What does the rash look like?      Red, patchy 2. LOCATION: Where is the rash located?      Back of hands 3. NUMBER: How many hives are there?      Na  4. SIZE: How big are the hives? (e.g., inches, cm, compare to coins) Do they all look the same or do they vary in shape and size?      Shape varied 5. ONSET: When did the hives begin? (e.g., hours or days ago)      After eating beef - several hours 6. ITCHING: Does it itch? If Yes, ask: How bad is the itch?  (e.g., none, mild, moderate, severe)     Denies itching currently 7. RECURRENT PROBLEM: Have you had hives before? If Yes, ask: When was the last time? and What happened that time?      Na  8. TRIGGERS: Were you exposed to any new food, plant, cosmetic product or animal just before the hives began?    Eating beef 9. OTHER SYMPTOMS: Do you have any other  symptoms? (e.g., fever, tongue swelling, difficulty breathing, abdomen pain)     Vomiting-once; diarrhea-3x.   Wife called with concerns of allergic reaction. Pt hasn't had beef or pork since Feb and ate [employer] hamburger last night. 2.5 hours later intense itching/rash on palms, throat itchy, diarrhea, and vomiting. Pt took 2 benadryl  and Claritin symptoms calmed down and fell asleep. No breathing issues.  Wife stated no issues this morning. Wife concerned after pulling 3 ticks since March pt has alpha gal. Wife stated pt can be ugly to providers and PCP can handle him. Wife stated he will stay away from beef/pork until seeing PCP for review. Soonest appt is 7/31.  Protocols used: Hives-A-AH

## 2024-02-11 ENCOUNTER — Telehealth: Payer: Self-pay | Admitting: Medical

## 2024-02-11 NOTE — Telephone Encounter (Signed)
 Copied from CRM [id number]. Topic: Medicare AWV >> Feb 11, 2024 10:12 AM Nathanel DEL wrote: Reason for CRM: Called LVM 02/11/2024 to schedule AWV. Please schedule Virtual or Telehealth visits ONLY.   Nathanel Paschal; Care Guide Ambulatory Clinical Support [place] [facility] Direct Dial: [phone]

## 2024-02-13 ENCOUNTER — Other Ambulatory Visit: Payer: Self-pay | Admitting: Internal Medicine

## 2024-02-15 NOTE — Telephone Encounter (Signed)
 Pt needs an appt to be seen before any further refills

## 2024-02-18 ENCOUNTER — Ambulatory Visit (INDEPENDENT_AMBULATORY_CARE_PROVIDER_SITE_OTHER): Admitting: Medical

## 2024-02-18 ENCOUNTER — Encounter: Payer: Self-pay | Admitting: Medical

## 2024-02-18 VITALS — BP 130/70 | HR 90 | Resp 16 | Ht 72.0 in | Wt 237.6 lb

## 2024-02-18 DIAGNOSIS — W57XXXA Bitten or stung by nonvenomous insect and other nonvenomous arthropods, initial encounter: Secondary | ICD-10-CM | POA: Diagnosis not present

## 2024-02-18 DIAGNOSIS — S20362A Insect bite (nonvenomous) of left front wall of thorax, initial encounter: Secondary | ICD-10-CM | POA: Diagnosis not present

## 2024-02-18 DIAGNOSIS — T7840XA Allergy, unspecified, initial encounter: Secondary | ICD-10-CM

## 2024-02-18 NOTE — Patient Instructions (Signed)
 Recurrent allergic reactions (one event after  red meat ingestion) other event possible meat consumption? Hx of 3 tick bites since April. Recurrent pruritus and rash after red meat ingestion, with potential anaphylaxis risk. Possible link to recent tick bites. Discussed anaphylaxis symptoms and emergency response. - Order tick bite studies  antibodies for Lyme disease and [facility] spotted fever. - Refer to allergist for further evaluation and testing. - Prescribe EpiPen  for emergency use in case of anaphylactic symptoms. - Advise to avoid red meat, including beef, pork, lamb, and venison. - Instruct to take two Benadryl  if allergic symptoms occur. - Advise to use EpiPen  and go to the emergency department if anaphylactic symptoms develop  Follow up in 2 weeks or sooner if needed

## 2024-02-18 NOTE — Progress Notes (Signed)
 Subjective:    Patient ID: Timothy Hall, male    DOB: 05/06/1956, 68 y.o.   MRN: [id number]  HPI Timothy Hall is a 68 year old male who presents with recurrent episodes of itching and rash after consuming beef.  He experiences episodes of intense itching and rash, primarily on his trunk, after possibly consuming beef(wife was not present  with him on the 4th but she thinks he may have ate beef/hamburger). The first episode occurred on July 4th during the night, with itching across his chest and flank. He took Claritin, which alleviated the itching within an hour, and the rash resolved by morning. No shortness of breath or lip swelling during this episode.  A second episode occurred after consuming a hamburger from [facility], where he experienced severe itching of his palms and body. He initially took Benadryl , which did not help, followed by Claritin, which eventually relieved the itching. However, he later experienced throat itching. No shortness of breath or lip swelling during this episode as well.  He denies any rashes behind his knees. He has had three tick bites since March, with the most recent in early June, located on his lower back and left axillary area.  He does not have a known history of eczema. He lives in an area with a high prevalence of ticks, leading to frequent tick bites.   Review of Systems  Constitutional:  Negative for chills, fatigue and fever.  HENT:  Negative for congestion and ear pain.   Respiratory:  Negative for cough, chest tightness and wheezing.   Cardiovascular:  Negative for chest pain and palpitations.  Gastrointestinal:  Negative for abdominal pain.  Musculoskeletal:  Negative for back pain, myalgias and neck stiffness.  Skin:  Negative for rash.  Neurological:  Negative for dizziness, syncope and headaches.  Hematological:  Negative for adenopathy. Does not bruise/bleed easily.  Psychiatric/Behavioral:  Negative for behavioral problems and  decreased concentration.     Past Medical History:  Diagnosis Date   Cancer (HCC)    Complication of anesthesia    slow to awaken   Gastric neoplasm    Cystic, 18 cm - recently diagnosed October 2014    GERD (gastroesophageal reflux disease)    Heart murmur age 21   History of alcoholism (HCC)    Quit drinking in 1985   Pneumonia age 21   hx of   Rash    upper back due to gleevac   Sleep apnea    Intolerant of CPAP     Social History   Socioeconomic History   Marital status: Married    Spouse name: Not on file   Number of children: Not on file   Years of education: Not on file   Highest education level: Not on file  Occupational History   Occupation: [employer] as a second language teacher    Employer: [employer]  Tobacco Use   Smoking status: Every Day    Current packs/day: 2.00    Average packs/day: 2.0 packs/day for 40.0 years (80.0 ttl pk-yrs)    Types: Cigarettes   Smokeless tobacco: Never   Tobacco comments:    a pack and a quarter.   Vaping Use   Vaping status: Never Used  Substance and Sexual Activity   Alcohol use: No    Comment: quit in 1985   Drug use: No    Comment: History of alcoholism   Sexual activity: Yes  Other Topics Concern   Not on file  Social History Narrative  Married, wife Mliss   Has #1 grown daughter and a grandson   Has a dog he is close to   Enjoys working on cars and fishing   Social Drivers of Corporate investment banker Strain: Not on file  Food Insecurity: No Food Insecurity (12/29/2022)   Hunger Vital Sign    Worried About Running Out of Food in the Last Year: Never true    Ran Out of Food in the Last Year: Never true  Transportation Needs: No Transportation Needs (12/29/2022)   PRAPARE - Administrator, Civil Service (Medical): No    Lack of Transportation (Non-Medical): No  Physical Activity: Not on file  Stress: Not on file  Social Connections: Not on file  Intimate Partner Violence: Not on file    Past  Surgical History:  Procedure Laterality Date   breast tumor removed Right 1976   COLONOSCOPY N/A 11/20/2014   Procedure: COLONOSCOPY;  Surgeon: Timothy CHRISTELLA Hollingshead, MD;  [facility];  Service: Endoscopy;  Laterality: N/A;  1030 - moved to 5/2 @ 10:30   EUS N/A 05/25/2013   Procedure: ESOPHAGEAL ENDOSCOPIC ULTRASOUND (EUS) RADIAL;  Surgeon: Elsie Cree, MD;  Location: [facility];  Service: Endoscopy;  Laterality: N/A;   EUS N/A 07/12/2014   EGD by Dr. Cree: small hh, widely patent Schatzki's ring. evidence of prior small gastric wedge resection along body of stomach. no evidence of recurrent tumor.    FINE NEEDLE ASPIRATION N/A 05/25/2013   Procedure: FINE NEEDLE ASPIRATION (FNA) LINEAR;  Surgeon: Elsie Cree, MD;  Location: [facility];  Service: Endoscopy;  Laterality: N/A;this part never happend   LAPAROSCOPIC GASTRECTOMY N/A 07/19/2013   Procedure: RESECTION OF RETROGASTRIC CYSTIC NEOPLASM WITH PARTIAL GASTRECTOMY;  Surgeon: Elon CHRISTELLA Pacini, MD;  Location: [facility];  Service: General;  Laterality: N/A;   lipoma removed from back  yrs ago   THYROIDECTOMY Left 01/07/2019   Procedure: Left thyroid  lobectomy, with frozen section;  Surgeon: Jesus Oliphant, MD;  [facility];  Service: ENT;  Laterality: Left;   TUMOR REMOVAL     Benign tumor removed from left breast and back area.     Family History  Problem Relation Age of Onset   Breast cancer Mother    CAD Brother        Premature disease   Colon cancer Neg Hx    Stomach cancer Neg Hx     Allergies  Allergen Reactions   Nicoderm [Nicotine ] Rash    Current Outpatient Medications on File Prior to Visit  Medication Sig Dispense Refill   amLODipine  (NORVASC ) 10 MG tablet TAKE 1 TABLET BY MOUTH DAILY. 90 tablet 1   atorvastatin  (LIPITOR) 20 MG tablet TAKE ONE TABLET BY MOUTH ONCE DAILY. 90 tablet 1   buPROPion  (WELLBUTRIN  XL) 150 MG 24 hr tablet TAKE (1) TABLET BY MOUTH ONCE DAILY. 90 tablet 1   co-enzyme Q-10 50 MG  capsule Take 200 mg by mouth daily.     famotidine  (PEPCID ) 20 MG tablet Take 1 tablet (20 mg total) by mouth at bedtime. 30 tablet 11   losartan  (COZAAR ) 25 MG tablet Take 1 tablet (25 mg total) by mouth daily. 100 tablet 3   metFORMIN  (GLUCOPHAGE -XR) 500 MG 24 hr tablet Take 1 tablet (500 mg total) by mouth 2 (two) times daily with a meal. Needs appt 60 tablet 0   pantoprazole  (PROTONIX ) 20 MG tablet TAKE ONE TABLET BY MOUTH 2 TIMES A DAY BEFORE A MEAL 60  tablet 0   vitamin B-12 (CYANOCOBALAMIN ) 1000 MCG tablet Take 1,000 mcg by mouth every other day.     No current facility-administered medications on file prior to visit.    BP 130/70   Pulse 90   Resp 16   Ht 6' (1.829 m)   Wt 237 lb 9.6 oz (107.8 kg)   SpO2 95%   BMI 32.22 kg/m        Objective:   Physical Exam  General Mental Status- Alert. General Appearance- Not in acute distress.   Skin General: Color- Normal Color. Moisture- Normal Moisture.  Neck . No JVD.  Chest and Lung Exam Auscultation: Breath Sounds:-Normal.  Cardiovascular Auscultation:Rythm- Regular. Murmurs & Other Heart Sounds:Auscultation of the heart reveals- No Murmurs.  Abdomen Inspection:-Inspeection Normal. Palpation/Percussion:Note:No mass. Palpation and Percussion of the abdomen reveal- Non Tender, Non Distended + BS, no rebound or guarding.    Neurologic CN III- XII grossy intact. Strength:- 5/5 equal and symmetric strength both upper and lower extremities.       Assessment & Plan:   Recurrent allergic reactions (one event after  red meat ingestion) other event possible meat consumption? Hx of 3 tick bites since April. Recurrent pruritus and rash after red meat ingestion, with potential anaphylaxis risk. Possible link to recent tick bites. Discussed anaphylaxis symptoms and emergency response. - Order tick bite studies  antibodies for Lyme disease and [facility] spotted fever. - Refer to allergist for further evaluation and  testing. - Prescribe EpiPen  for emergency use in case of anaphylactic symptoms. - Advise to avoid red meat, including beef, pork, lamb, and venison. - Instruct to take two Benadryl  if allergic symptoms occur. - Advise to use EpiPen  and go to the emergency department if anaphylactic symptoms develop  Follow up in 2 weeks or sooner if needed  [employer], PA-C

## 2024-02-19 ENCOUNTER — Ambulatory Visit: Payer: Self-pay | Admitting: Medical

## 2024-02-22 ENCOUNTER — Encounter: Payer: Self-pay | Admitting: Medical

## 2024-02-22 NOTE — Addendum Note (Signed)
 Addended by: DORINA DALLAS HERO on: 02/22/2024 09:14 PM   Modules accepted: Orders

## 2024-02-22 NOTE — Progress Notes (Signed)
 [facility] Quality Team Note  Name: Timothy Hall Date of Birth: 1955-10-12 MRN: [id number] Date: 02/22/2024  [facility] Quality Team has reviewed this patient's chart, please see recommendations below:  [facility] Quality Other; (CHART REVIEWED FOR CBP. ABSTRACTED MOST RECENT VALUE FOR GAP CLOSURE.)

## 2024-02-24 LAB — ROCKY MTN SPOTTED FVR ABS PNL(IGG+IGM)
RMSF IgG: DETECTED — AB
RMSF IgM: NOT DETECTED

## 2024-02-24 LAB — REFLEX RMSF IGG TITER: RMSF IgG Titer: 1:128 {titer} — ABNORMAL HIGH

## 2024-02-24 LAB — B. BURGDORFI ANTIBODIES: B burgdorferi Ab IgG+IgM: <0.9 {index}

## 2024-02-25 ENCOUNTER — Telehealth: Payer: Self-pay

## 2024-02-25 MED ORDER — DOXYCYCLINE HYCLATE 100 MG PO TABS: 100.0000 mg | ORAL_TABLET | Freq: Two times a day (BID) | ORAL | 0 refills | Status: DC

## 2024-02-25 NOTE — Telephone Encounter (Signed)
 Faxed with documents for this past year    Copied from CRM [id number]. Topic: Medical Record Request - Other >> Feb 25, 2024  9:53 AM Franky GRADE wrote: Reason for CRM: Health Department is calling requesting office notes, labs and treatment plan for DOS 02/18/2024.Their fax number is [phone].

## 2024-02-25 NOTE — Addendum Note (Signed)
 Addended by: DORINA DALLAS HERO on: 02/25/2024 06:20 AM   Modules accepted: Orders

## 2024-02-26 ENCOUNTER — Telehealth: Payer: Self-pay | Admitting: Medical

## 2024-02-26 NOTE — Telephone Encounter (Unsigned)
 Copied from CRM #[id number]. Topic: General - Other >> Feb 26, 2024  9:50 AM Gennette ORN wrote: Reason for CRM: Hoy from [facility] called because she needed patient's race.

## 2024-03-04 ENCOUNTER — Other Ambulatory Visit: Payer: Self-pay | Admitting: Medical

## 2024-03-13 ENCOUNTER — Other Ambulatory Visit: Payer: Self-pay | Admitting: Medical

## 2024-03-24 ENCOUNTER — Encounter: Payer: Self-pay | Admitting: Pharmacist

## 2024-03-24 ENCOUNTER — Other Ambulatory Visit: Payer: Self-pay | Admitting: Pharmacist

## 2024-03-24 MED ORDER — METFORMIN HCL ER 500 MG PO TB24: 500.0000 mg | ORAL_TABLET | Freq: Two times a day (BID) | ORAL | 1 refills | Status: DC

## 2024-03-24 NOTE — Telephone Encounter (Signed)
 Rx for metformin  sent for #60 with 1 refill - sees PCP 05/11/2024

## 2024-03-24 NOTE — Progress Notes (Addendum)
 Pharmacy Quality Measure Review  This patient is appearing on a report for being at risk of failing the adherence measure for diabetes medications this calendar year.   Medication: metformin  ER 500mg  Last fill date: 02/04/2024 for 30 day supply  Last Rx refill request was denied - appt needed. Patient has appointment with PCP for 05/11/2024.   Sent in refill for metformin  ER 500mg  #60 with 1 refill to last until next appt with PCP.   Madelin Ray, PharmD Clinical Pharmacist [facility]  Population Health [phone]   04/07/2024 - reviewed refill history since 03/24/2024- patient filled and picked up metformin  refill for 30 day supply.   Madelin Ray, PharmD Clinical Pharmacist [facility]

## 2024-03-26 ENCOUNTER — Other Ambulatory Visit: Payer: Self-pay | Admitting: Internal Medicine

## 2024-04-07 ENCOUNTER — Ambulatory Visit: Admitting: Internal Medicine

## 2024-04-14 ENCOUNTER — Other Ambulatory Visit: Payer: Self-pay | Admitting: Medical

## 2024-04-25 ENCOUNTER — Telehealth: Payer: Self-pay | Admitting: Pharmacist

## 2024-04-25 NOTE — Progress Notes (Signed)
 Pharmacy Quality Measure Review  This patient is appearing on a report for being at risk of failing the adherence measure for diabetes medications this calendar year.   Medication: metformin  ER 500mg  Last fill date: 02/04/2024 for 30 day supply  He actually last filled metformin  03/24/2024 for 30 DS. He has 1 refill remaining. Patient has appointment with PCP for 05/11/2024.   Spoke with patient's wife but she was at work and was not sure if he needed a refill or not. She states she usually fills his weekly medication container on Saturdays and will order refills if there is not enough for the next week. She did not remember needing a refill yet.  Reminder her that there is a refill remaining at the pharmacy when needed.   Madelin Ray, PharmD Clinical Pharmacist [facility]  Population Health [phone]

## 2024-05-04 DIAGNOSIS — J300 Vasomotor rhinitis: Secondary | ICD-10-CM | POA: Diagnosis not present

## 2024-05-08 ENCOUNTER — Encounter: Payer: Self-pay | Admitting: Pharmacist

## 2024-05-08 ENCOUNTER — Other Ambulatory Visit: Payer: Self-pay | Admitting: Internal Medicine

## 2024-05-08 NOTE — Progress Notes (Signed)
 Pharmacy Quality Measure Review  This patient is appearing on a report for being at risk of failing the adherence measure for diabetes medications this calendar year.   Medication: metformin  ER 500mg  Last fill date: 03/24/2024 for 30 day supply  He actually last filled metformin  today 05/08/2024 for 30 DS. He has no refills remaining. Patient has appointment with PCP for 05/11/2024.   No further intervention needed at this time but will follow up to make sure Rx is updated after he sees PCP.   Madelin Ray, PharmD Clinical Pharmacist [facility]  Population Health [phone]

## 2024-05-09 NOTE — Telephone Encounter (Signed)
 Pt needs an office visit before anymore refills.

## 2024-05-11 ENCOUNTER — Ambulatory Visit: Admitting: Medical

## 2024-05-11 ENCOUNTER — Encounter: Payer: Self-pay | Admitting: Medical

## 2024-05-11 VITALS — BP 140/70 | HR 93 | Temp 97.9°F | Resp 16 | Ht 72.0 in | Wt 241.0 lb

## 2024-05-11 DIAGNOSIS — E119 Type 2 diabetes mellitus without complications: Secondary | ICD-10-CM | POA: Diagnosis not present

## 2024-05-11 DIAGNOSIS — I10 Essential (primary) hypertension: Secondary | ICD-10-CM

## 2024-05-11 DIAGNOSIS — E1165 Type 2 diabetes mellitus with hyperglycemia: Secondary | ICD-10-CM

## 2024-05-11 DIAGNOSIS — Z23 Encounter for immunization: Secondary | ICD-10-CM

## 2024-05-11 DIAGNOSIS — E039 Hypothyroidism, unspecified: Secondary | ICD-10-CM | POA: Diagnosis not present

## 2024-05-11 DIAGNOSIS — E785 Hyperlipidemia, unspecified: Secondary | ICD-10-CM | POA: Diagnosis not present

## 2024-05-11 MED ORDER — TRIAMCINOLONE ACETONIDE 0.1 % EX CREA: 1.0000 | TOPICAL_CREAM | Freq: Two times a day (BID) | CUTANEOUS | 0 refills | Status: DC

## 2024-05-11 NOTE — Progress Notes (Signed)
 Subjective:    Patient ID: Timothy Hall, male    DOB: 05/31/1956, 68 y.o.   MRN: [id number]  HPI  Last AVS below in    allergic reactions (one event after  red meat ingestion) other event possible meat consumption? Hx of 3 tick bites since April. Recurrent pruritus and rash after red meat ingestion, with potential anaphylaxis risk. Possible link to recent tick bites. Discussed anaphylaxis symptoms and emergency response. - Order tick bite studies  antibodies for Lyme disease and [facility] spotted fever. - Refer to allergist for further evaluation and testing. - Prescribe EpiPen  for emergency use in case of anaphylactic symptoms. - Advise to avoid red meat, including beef, pork, lamb, and venison. - Instruct to take two Benadryl  if allergic symptoms occur. - Advise to use EpiPen  and go to the emergency department if anaphylactic symptoms develop   Follow up in 2 weeks or sooner if needed  Timothy Hall is a 68 year old male who presents with intermittent hand itching and rash.  He experiences severe itching on his hands, which occurs unexpectedly and causes red spots. The itching is intense, affecting both palms and sometimes between the fingers. He has been using children's Benadryl  by mouth, which alleviates the symptoms. The episodes are sporadic and unpredictable. Can respond quickly just to one dose of benadry.  He underwent allergy testing on May 04, 2024, which involved skin tests on his back and arm. The allergist conducted skin tests, which were negative for food, environmental, and animal allergies. An alpha-gal panel is pending to assess for alpha-gal syndrome, which can cause meat sensitivity after tick bites. See last visit hx of 3 tick bites, rash after eating meast at times and tick bite studies +igg in past.  He is currently taking amlodipine  10 mg and losartan  25 mg for hypertension, Lipitor for high cholesterol, and has a history of diabetes with a last A1c  of 6.8% in December 2022.    Review of Systems  Constitutional:  Negative for chills, fatigue and fever.  Respiratory:  Negative for cough, chest tightness and wheezing.   Cardiovascular:  Negative for chest pain.  Gastrointestinal:  Negative for abdominal pain, constipation and diarrhea.  Genitourinary:  Negative for dysuria and frequency.  Musculoskeletal:  Negative for back pain.  Skin:  Positive for rash.  Neurological:  Negative for dizziness, weakness and light-headedness.  Hematological:  Negative for adenopathy.  Psychiatric/Behavioral:  Negative for behavioral problems, decreased concentration and hallucinations.     Past Medical History:  Diagnosis Date   Cancer (HCC)    Complication of anesthesia    slow to awaken   Gastric neoplasm    Cystic, 18 cm - recently diagnosed October 2014    GERD (gastroesophageal reflux disease)    Heart murmur age 89   History of alcoholism (HCC)    Quit drinking in 1985   Pneumonia age 89   hx of   Rash    upper back due to gleevac   Sleep apnea    Intolerant of CPAP     Social History   Socioeconomic History   Marital status: Married    Spouse name: Not on file   Number of children: Not on file   Years of education: Not on file   Highest education level: Not on file  Occupational History   Occupation: [employer] as a second language teacher    Employer: [employer]  Tobacco Use   Smoking status: Every Day    Current  packs/day: 2.00    Average packs/day: 2.0 packs/day for 40.0 years (80.0 ttl pk-yrs)    Types: Cigarettes   Smokeless tobacco: Never   Tobacco comments:    a pack and a quarter.   Vaping Use   Vaping status: Never Used  Substance and Sexual Activity   Alcohol use: No    Comment: quit in 1985   Drug use: No    Comment: History of alcoholism   Sexual activity: Yes  Other Topics Concern   Not on file  Social History Narrative   Married, wife Mliss   Has #1 grown daughter and a grandson   Has a dog he is close  to   Enjoys working on cars and fishing   Social Drivers of Corporate investment banker Strain: Not on file  Food Insecurity: No Food Insecurity (12/29/2022)   Hunger Vital Sign    Worried About Running Out of Food in the Last Year: Never true    Ran Out of Food in the Last Year: Never true  Transportation Needs: No Transportation Needs (12/29/2022)   PRAPARE - Administrator, Civil Service (Medical): No    Lack of Transportation (Non-Medical): No  Physical Activity: Not on file  Stress: Not on file  Social Connections: Not on file  Intimate Partner Violence: Not on file    Past Surgical History:  Procedure Laterality Date   breast tumor removed Right 1976   COLONOSCOPY N/A 11/20/2014   Procedure: COLONOSCOPY;  Surgeon: Timothy CHRISTELLA Hollingshead, MD;  [facility];  Service: Endoscopy;  Laterality: N/A;  1030 - moved to 5/2 @ 10:30   EUS N/A 05/25/2013   Procedure: ESOPHAGEAL ENDOSCOPIC ULTRASOUND (EUS) RADIAL;  Surgeon: Elsie Cree, MD;  [facility];  Service: Endoscopy;  Laterality: N/A;   EUS N/A 07/12/2014   EGD by Dr. Cree: small hh, widely patent Schatzki's ring. evidence of prior small gastric wedge resection along body of stomach. no evidence of recurrent tumor.    FINE NEEDLE ASPIRATION N/A 05/25/2013   Procedure: FINE NEEDLE ASPIRATION (FNA) LINEAR;  Surgeon: Elsie Cree, MD;  [facility];  Service: Endoscopy;  Laterality: N/A;this part never happend   LAPAROSCOPIC GASTRECTOMY N/A 07/19/2013   Procedure: RESECTION OF RETROGASTRIC CYSTIC NEOPLASM WITH PARTIAL GASTRECTOMY;  Surgeon: Elon CHRISTELLA Pacini, MD;  Location: [facility];  Service: General;  Laterality: N/A;   lipoma removed from back  yrs ago   THYROIDECTOMY Left 01/07/2019   Procedure: Left thyroid  lobectomy, with frozen section;  Surgeon: Jesus Oliphant, MD;  Location: [facility] OR;  Service: ENT;  Laterality: Left;   TUMOR REMOVAL     Benign tumor removed from left breast and back area.      Family History  Problem Relation Age of Onset   Breast cancer Mother    CAD Brother        Premature disease   Colon cancer Neg Hx    Stomach cancer Neg Hx     Allergies  Allergen Reactions   Nicoderm [Nicotine ] Rash    Current Outpatient Medications on File Prior to Visit  Medication Sig Dispense Refill   amLODipine  (NORVASC ) 10 MG tablet TAKE 1 TABLET BY MOUTH DAILY. 90 tablet 1   atorvastatin  (LIPITOR) 20 MG tablet Take 1 tablet (20 mg total) by mouth daily. 100 tablet 3   buPROPion  (WELLBUTRIN  XL) 150 MG 24 hr tablet TAKE (1) TABLET BY MOUTH ONCE DAILY. 90 tablet 1   co-enzyme Q-10 50  MG capsule Take 200 mg by mouth daily.     doxycycline  (VIBRA -TABS) 100 MG tablet Take 1 tablet (100 mg total) by mouth 2 (two) times daily. 20 tablet 0   famotidine  (PEPCID ) 20 MG tablet TAKE ONE TABLET BY MOUTH AT BEDTIME 30 tablet 0   losartan  (COZAAR ) 25 MG tablet Take 1 tablet (25 mg total) by mouth daily. 100 tablet 3   metFORMIN  (GLUCOPHAGE -XR) 500 MG 24 hr tablet Take 1 tablet (500 mg total) by mouth 2 (two) times daily with a meal. 60 tablet 1   pantoprazole  (PROTONIX ) 20 MG tablet TAKE ONE TABLET BY MOUTH 2 TIMES A DAY BEFORE A MEAL 60 tablet 0   vitamin B-12 (CYANOCOBALAMIN ) 1000 MCG tablet Take 1,000 mcg by mouth every other day.     No current facility-administered medications on file prior to visit.    BP (!) 140/70   Pulse 93   Temp 97.9 F (36.6 C) (Oral)   Resp 16   Ht 6' (1.829 m)   Wt 241 lb (109.3 kg)   SpO2 97%   BMI 32.69 kg/m        Objective:   Physical Exam  General Mental Status- Alert. General Appearance- Not in acute distress.   Skin No rash on palms presently.  Neck Carotid Arteries- Normal color. Moisture- Normal Moisture. No carotid bruits. No JVD.  Chest and Lung Exam Auscultation: Breath Sounds:-Normal.  Cardiovascular Auscultation:Rythm- Regular. Murmurs & Other Heart Sounds:Auscultation of the heart reveals- No  Murmurs.  Abdomen Inspection:-Inspeection Normal. Palpation/Percussion:Note:No mass. Palpation and Percussion of the abdomen reveal- Non Tender, Non Distended + BS, no rebound or guarding.   Neurologic Cranial Nerve exam:- CN III-XII intact(No nystagmus), symmetric smile. Strength:- 5/5 equal and symmetric strength both upper and lower extremities.       Assessment & Plan:   Intermittent Hand Rash(Eczema vs allergic reaction) Intermittent itching and rash on palms and fingers, relieved by Benadryl . Differential includes dyshidrotic eczema, allergies and neurodermatitis. Referred to allergist. Alpha-gal syndrome testing pending. - Prescribed triamcinolone cream twice daily for three days if oral Benadryl  ineffective. - Advised moisturizing hands once or twice daily. - Awaiting alpha-gal syndrome test results.  Hypertension Blood pressure elevated at 140/70 mmHg. On amlodipine  10 mg and losartan  25 mg.  Type 2 Diabetes Mellitus Last A1c 6.8% in December, indicating good control. - Order future labs including A1c. -continue current diabetic meds  Hyperlipidemia On Lipitor. - Order future lipid panel and cmp  Follow up date to be determined after lab review  [place] Maxwell, PA-C

## 2024-05-11 NOTE — Patient Instructions (Signed)
 Intermittent Hand Rash(Eczema vs allergic reaction) Intermittent itching and rash on palms and fingers, relieved by Benadryl . Differential includes dyshidrotic eczema, allergies and neurodermatitis. Referred to allergist. Alpha-gal syndrome testing pending. - Prescribed triamcinolone cream twice daily for three days if oral Benadryl  ineffective. - Advised moisturizing hands once or twice daily. - Awaiting alpha-gal syndrome test results.  Hypertension Blood pressure elevated at 140/70 mmHg. On amlodipine  10 mg and losartan  25 mg.  Type 2 Diabetes Mellitus Last A1c 6.8% in December, indicating good control. - Order future labs including A1c. -continue current diabetic meds  Hyperlipidemia On Lipitor. - Order future lipid panel and cmp  Follow up date to be determined after lab review

## 2024-05-19 ENCOUNTER — Ambulatory Visit: Admitting: Internal Medicine

## 2024-05-19 VITALS — BP 126/74 | HR 102 | Temp 98.3°F | Ht 72.0 in | Wt 241.0 lb

## 2024-05-19 DIAGNOSIS — K219 Gastro-esophageal reflux disease without esophagitis: Secondary | ICD-10-CM

## 2024-05-19 DIAGNOSIS — C49A0 Gastrointestinal stromal tumor, unspecified site: Secondary | ICD-10-CM

## 2024-05-19 DIAGNOSIS — D490 Neoplasm of unspecified behavior of digestive system: Secondary | ICD-10-CM

## 2024-05-19 DIAGNOSIS — Z1211 Encounter for screening for malignant neoplasm of colon: Secondary | ICD-10-CM

## 2024-05-19 MED ORDER — PANTOPRAZOLE SODIUM 20 MG PO TBEC: 20.0000 mg | DELAYED_RELEASE_TABLET | Freq: Two times a day (BID) | ORAL | 3 refills | Status: AC

## 2024-05-19 MED ORDER — FAMOTIDINE 20 MG PO TABS: 20.0000 mg | ORAL_TABLET | Freq: Every day | ORAL | 3 refills | Status: AC

## 2024-05-19 NOTE — Progress Notes (Signed)
 Primary Care Physician:  Dorina Loving, PA-C Primary Gastroenterologist:  Dr. Cindie  Chief Complaint  Patient presents with   Follow-up    Follow up refills and acid reflux    HPI:   Timothy Hall is a 68 y.o. male who presents to the clinic today for follow-up visit.  Patient diagnosed with GIST in 2014 after presenting with abnormal CTA. Underwent partial gastrectomy  (Dr. Chyrel). EGD by Dr. Elsie Cree in December 2015 for follow up regarding GIST showed no evidence of recurrent GIST. Shortly after his EGD patient had admission for small bowel obstruction. CT scan in December showed high-grade small bowel obstruction with transition point near a probable omental infarct, suspect adhesion or internal hernia. Small bowel obstruction resolved on its own. CT which was performed for follow-up of GIST just a couple weeks prior to this admission showed new 8 cm region of marginal stranding along prominent inferior omental adipose tissue. Small bowel draped over this fatty lesion. Felt could represent infarct of omental adipose tissue, less likely tumor deposit along the omentum. Admission 07/20/2018 with a recurrent small bowel obstruction, CT negative for evidence of recurrent gastrointestinal stromal tumor   Completed 3 years of adjuvant Imatinib .   Colonoscopy 11/20/14 with 1 small hyperplastic polyp, recall 10 years.   Today, states he is doing well. Seeing oncology once a year, currently in remission. Has chronic GERD. Currently taking pantoprazole  20 mg BID previously with nighttime breakthrough symptoms, added once nighty famotidine  and doing much better. No dysphagia/odynophagia. No epigastric or chest pain. Occasional hiccups.   Past Medical History:  Diagnosis Date   Cancer [facility])    Complication of anesthesia    slow to awaken   Gastric neoplasm    Cystic, 18 cm - recently diagnosed October 2014    GERD (gastroesophageal reflux disease)    Heart murmur age 5   History  of alcoholism (HCC)    Quit drinking in 1985   Pneumonia age 67   hx of   Rash    upper back due to gleevac   Sleep apnea    Intolerant of CPAP    Past Surgical History:  Procedure Laterality Date   breast tumor removed Right 1976   COLONOSCOPY N/A 11/20/2014   Procedure: COLONOSCOPY;  Surgeon: Lamar CHRISTELLA Hollingshead, MD;  Location: [facility];  Service: Endoscopy;  Laterality: N/A;  1030 - moved to 5/2 @ 10:30   EUS N/A 05/25/2013   Procedure: ESOPHAGEAL ENDOSCOPIC ULTRASOUND (EUS) RADIAL;  Surgeon: Elsie Cree, MD;  Location: [facility];  Service: Endoscopy;  Laterality: N/A;   EUS N/A 07/12/2014   EGD by Dr. Cree: small hh, widely patent Schatzki's ring. evidence of prior small gastric wedge resection along body of stomach. no evidence of recurrent tumor.    FINE NEEDLE ASPIRATION N/A 05/25/2013   Procedure: FINE NEEDLE ASPIRATION (FNA) LINEAR;  Surgeon: Elsie Cree, MD;  Location: [facility];  Service: Endoscopy;  Laterality: N/A;this part never happend   LAPAROSCOPIC GASTRECTOMY N/A 07/19/2013   Procedure: RESECTION OF RETROGASTRIC CYSTIC NEOPLASM WITH PARTIAL GASTRECTOMY;  Surgeon: Elon CHRISTELLA Pacini, MD;  Location: [facility] ORS;  Service: General;  Laterality: N/A;   lipoma removed from back  yrs ago   THYROIDECTOMY Left 01/07/2019   Procedure: Left thyroid  lobectomy, with frozen section;  Surgeon: Jesus Oliphant, MD;  Location: [facility] OR;  Service: ENT;  Laterality: Left;   TUMOR REMOVAL     Benign tumor removed from left breast and back area.  Current Outpatient Medications  Medication Sig Dispense Refill   amLODipine  (NORVASC ) 10 MG tablet TAKE 1 TABLET BY MOUTH DAILY. 90 tablet 1   aspirin  EC 81 MG tablet Take 81 mg by mouth daily. Swallow whole.     atorvastatin  (LIPITOR) 20 MG tablet Take 1 tablet (20 mg total) by mouth daily. 100 tablet 3   buPROPion  (WELLBUTRIN  XL) 150 MG 24 hr tablet TAKE (1) TABLET BY MOUTH ONCE DAILY. 90 tablet 1   co-enzyme Q-10 50 MG capsule Take 200  mg by mouth daily.     famotidine  (PEPCID ) 20 MG tablet TAKE ONE TABLET BY MOUTH AT BEDTIME 30 tablet 0   losartan  (COZAAR ) 25 MG tablet Take 1 tablet (25 mg total) by mouth daily. 100 tablet 3   metFORMIN  (GLUCOPHAGE -XR) 500 MG 24 hr tablet Take 1 tablet (500 mg total) by mouth 2 (two) times daily with a meal. 60 tablet 1   pantoprazole  (PROTONIX ) 20 MG tablet TAKE ONE TABLET BY MOUTH 2 TIMES A DAY BEFORE A MEAL 60 tablet 0   vitamin B-12 (CYANOCOBALAMIN ) 1000 MCG tablet Take 1,000 mcg by mouth every other day.     doxycycline  (VIBRA -TABS) 100 MG tablet Take 1 tablet (100 mg total) by mouth 2 (two) times daily. 20 tablet 0   triamcinolone cream (KENALOG) 0.1 % Apply 1 Application topically 2 (two) times daily. (Patient not taking: Reported on 05/19/2024) 30 g 0   No current facility-administered medications for this visit.    Allergies as of 05/19/2024 - Review Complete 05/19/2024  Allergen Reaction Noted   Nicoderm [nicotine ] Rash 08/04/2013    Family History  Problem Relation Age of Onset   Breast cancer Mother    CAD Brother        Premature disease   Colon cancer Neg Hx    Stomach cancer Neg Hx     Social History   Socioeconomic History   Marital status: Married    Spouse name: Not on file   Number of children: Not on file   Years of education: Not on file   Highest education level: Not on file  Occupational History   Occupation: [employer]    Employer: [employer]  Tobacco Use   Smoking status: Every Day    Current packs/day: 2.00    Average packs/day: 2.0 packs/day for 40.0 years (80.0 ttl pk-yrs)    Types: Cigarettes   Smokeless tobacco: Never   Tobacco comments:    a pack and a quarter.   Vaping Use   Vaping status: Never Used  Substance and Sexual Activity   Alcohol use: No    Comment: quit in 1985   Drug use: No    Comment: History of alcoholism   Sexual activity: Yes  Other Topics Concern   Not on file  Social History Narrative    Married, wife Mliss   Has #1 grown daughter and a grandson   Has a dog he is close to   Enjoys working on cars and fishing   Social Drivers of Corporate Investment Banker Strain: Not on file  Food Insecurity: No Food Insecurity (12/29/2022)   Hunger Vital Sign    Worried About Running Out of Food in the Last Year: Never true    Ran Out of Food in the Last Year: Never true  Transportation Needs: No Transportation Needs (12/29/2022)   PRAPARE - Administrator, Civil Service (Medical): No    Lack of Transportation (Non-Medical):  No  Physical Activity: Not on file  Stress: Not on file  Social Connections: Not on file  Intimate Partner Violence: Not on file    Subjective: Review of Systems  Constitutional:  Negative for chills and fever.  HENT:  Negative for congestion and hearing loss.   Eyes:  Negative for blurred vision and double vision.  Respiratory:  Negative for cough and shortness of breath.   Cardiovascular:  Negative for chest pain and palpitations.  Gastrointestinal:  Negative for abdominal pain, blood in stool, constipation, diarrhea, heartburn, melena and vomiting.  Genitourinary:  Negative for dysuria and urgency.  Musculoskeletal:  Negative for joint pain and myalgias.  Skin:  Negative for itching and rash.  Neurological:  Negative for dizziness and headaches.  Psychiatric/Behavioral:  Negative for depression. The patient is not nervous/anxious.        Objective: BP 126/74   Pulse (!) 102   Temp 98.3 F (36.8 C)   Ht 6' (1.829 m)   Wt 241 lb (109.3 kg)   BMI 32.69 kg/m  Physical Exam Constitutional:      Appearance: Normal appearance.  HENT:     Head: Normocephalic and atraumatic.  Eyes:     Extraocular Movements: Extraocular movements intact.     Conjunctiva/sclera: Conjunctivae normal.  Cardiovascular:     Rate and Rhythm: Normal rate and regular rhythm.  Pulmonary:     Effort: Pulmonary effort is normal.     Breath sounds: Normal  breath sounds.  Abdominal:     General: Bowel sounds are normal.     Palpations: Abdomen is soft.     Hernia: A hernia is present.  Musculoskeletal:        General: Normal range of motion.     Cervical back: Normal range of motion and neck supple.  Skin:    General: Skin is warm.  Neurological:     General: No focal deficit present.     Mental Status: He is alert and oriented to person, place, and time.  Psychiatric:        Mood and Affect: Mood normal.        Behavior: Behavior normal.      Assessment/Plan:  Chronic GERD- well controlled on pantoprazole  20 mg BID and famotidine  at bedtime. Will continue. Refilled today.   2.   GIST s/p partial gastrectomy and 3 years of adjuvant Imatinib  - currently in remission. Continue ot follow up with oncology.   3.    Colon cancer screening - Due for colonoscopy 2026.   Follow up in 1 year or sooner if needed.   05/19/2024 3:11 PM   Disclaimer: This note was dictated with voice recognition software. Similar sounding words can inadvertently be transcribed and may not be corrected upon review.

## 2024-05-19 NOTE — Patient Instructions (Signed)
  Continue on Pantoprazole  twice daily for your chronic reflux. This medication works best if you take it 30 min before breakfast and dinner. Continue Famotidine  nightly before bed. I refilled both today   We will plan on colonoscopy for colon cancer screening purposes next year.    Follow up in 1 year or sooner if needed.    It was very nice seeing you again today   Dr. Cindie

## 2024-05-28 ENCOUNTER — Other Ambulatory Visit: Payer: Self-pay | Admitting: Medical

## 2024-06-14 ENCOUNTER — Telehealth: Payer: Self-pay | Admitting: Pharmacist

## 2024-06-14 NOTE — Progress Notes (Signed)
 Pharmacy Quality Measure Review  This patient is appearing on a report for being at risk of failing the adherence measure for diabetes medications this calendar year.   Medication: metformin   Last fill date: 05/08/2024 for 30 day supply  Updated Rx was sent to [facility] after last visit with PCP on 05/30/2024 but was not filled because was too early. I asked [facility] to fill but after I spoke with patient's wife she states that a refill is not needed yet. She fills his pill box every Saturday and checks to see if refills at needed at that time. Called [facility] and asked to profile metformin  until patient's wife request.  Mrs. Brass did endorse that he is taking metformin  500mg  TWICE a day.   Reminded her that patient is due to have labs drawn. She states that will come in either tomorrow Wed 11/26 or Friday 11/27  Madelin Ray, PharmD Clinical Pharmacist [facility]  Population Health [phone]   06/15/2024 - Addendum I did not realize when I spoke to Mrs. Mangino yesterday that our office and lab would be closed on Friday 11/27. Notified her today. She will let Mr Stejskal know and get him here for labs in the next week.   Madelin Ray, PharmD Clinical Pharmacist [facility]

## 2024-07-06 NOTE — Progress Notes (Addendum)
 AHMED INNISS                                          MRN: [id number]   07/06/2024   The [facility] Quality Team Specialist reviewed this patient medical record for the purposes of chart review for care gap closure. The following were reviewed: abstraction for care gap closure-diabetic eye exam.  07/25/2024- no GSD to close for 2025 measurement year    [facility] Quality Team

## 2024-07-11 ENCOUNTER — Other Ambulatory Visit: Payer: Self-pay | Admitting: Acute Care

## 2024-07-11 DIAGNOSIS — Z122 Encounter for screening for malignant neoplasm of respiratory organs: Secondary | ICD-10-CM

## 2024-07-11 DIAGNOSIS — F17210 Nicotine dependence, cigarettes, uncomplicated: Secondary | ICD-10-CM

## 2024-07-11 DIAGNOSIS — Z87891 Personal history of nicotine dependence: Secondary | ICD-10-CM

## 2024-07-19 ENCOUNTER — Ambulatory Visit (HOSPITAL_COMMUNITY)
Admission: RE | Admit: 2024-07-19 | Discharge: 2024-07-19 | Disposition: A | Discharge status: Home / Self Care | Source: Ambulatory Visit | Attending: Acute Care | Admitting: Acute Care

## 2024-07-19 DIAGNOSIS — Z122 Encounter for screening for malignant neoplasm of respiratory organs: Secondary | ICD-10-CM | POA: Diagnosis present

## 2024-07-19 DIAGNOSIS — F17210 Nicotine dependence, cigarettes, uncomplicated: Secondary | ICD-10-CM | POA: Insufficient documentation

## 2024-07-19 DIAGNOSIS — Z87891 Personal history of nicotine dependence: Secondary | ICD-10-CM | POA: Diagnosis present

## 2024-07-29 ENCOUNTER — Telehealth: Payer: Self-pay | Admitting: Acute Care

## 2024-07-29 DIAGNOSIS — R911 Solitary pulmonary nodule: Secondary | ICD-10-CM

## 2024-07-29 NOTE — Telephone Encounter (Signed)
 Scan read as a LR 2 Incidental finding of Slightly increased size of 10 mm subcarinal lymph node, may be reactive.Previously measured as 8 mm. He has never had lymphadenopathy in the past.  There was notation of bronchial wall thickening may represent bronchitis.  Please ask if he has been sick. If he has then reactive lymphadenopathy makes sense. 12 month follow up. If he has not been sick, lets do a 6 month follow up LDCT to make sure that is better. Thanks. Please fax results to PCP and let them know plan of care.    Aortic Atherosclerosis (ICD10-I70.0). Coronary artery calcifications. Assessment for potential risk factor modification, dietary therapy or pharmacologic therapy may be warranted, if clinically indicated. Pt. Is on a statin but I do not see cards involvement. Last echo was 2014.  See if he wants referral back to cardiology. Thanks so much

## 2024-08-03 NOTE — Telephone Encounter (Signed)
 LVM and mailed letter to home.

## 2024-08-08 ENCOUNTER — Inpatient Hospital Stay: Payer: Medicare Other | Attending: Oncology | Admitting: Oncology

## 2024-08-08 VITALS — BP 143/73 | HR 100 | Temp 98.2°F | Resp 18 | Ht 72.0 in | Wt 244.2 lb

## 2024-08-08 DIAGNOSIS — C49A2 Gastrointestinal stromal tumor of stomach: Secondary | ICD-10-CM

## 2024-08-08 DIAGNOSIS — Z85028 Personal history of other malignant neoplasm of stomach: Secondary | ICD-10-CM | POA: Insufficient documentation

## 2024-08-08 DIAGNOSIS — F17210 Nicotine dependence, cigarettes, uncomplicated: Secondary | ICD-10-CM | POA: Diagnosis not present

## 2024-08-08 DIAGNOSIS — Z903 Acquired absence of stomach [part of]: Secondary | ICD-10-CM | POA: Insufficient documentation

## 2024-08-08 NOTE — Progress Notes (Signed)
 " [facility] OFFICE PROGRESS NOTE   Diagnosis: Gastrointestinal stromal tumor  INTERVAL HISTORY:   Timothy Hall returns as scheduled.  He feels well.  Good appetite.  He had an episode of rectal bleeding after straining 1 week ago.  He continues smoking, currently less than 1 pack/day.  Objective:  Vital signs in last 24 hours:  Blood pressure (!) 143/73, pulse 100, temperature 98.2 F (36.8 C), resp. rate 18, height 6' (1.829 m), weight 244 lb 3.2 oz (110.8 kg), SpO2 100%.   Lymphatics: No cervical, supraclavicular, axillary, or inguinal nodes Resp: Distant breath sounds with a prolonged expiratory phase, no respiratory distress Cardio: Regular rate and rhythm GI: No hepatosplenomegaly, no apparent ascites, nontender, no mass Vascular: No leg edema   Lab Results:  Lab Results  Component Value Date   WBC 10.9 (H) 01/05/2023   HGB 13.0 01/05/2023   HCT 40.2 01/05/2023   MCV 88.4 01/05/2023   PLT 312.0 01/05/2023   NEUTROABS 6.6 01/05/2023    CMP  Lab Results  Component Value Date   NA 139 07/13/2023   K 4.7 07/13/2023   CL 104 07/13/2023   CO2 27 07/13/2023   GLUCOSE 112 (H) 07/13/2023   BUN 19 07/13/2023   CREATININE 0.93 07/13/2023   CALCIUM  9.5 07/13/2023   PROT 6.9 07/13/2023   ALBUMIN 4.4 07/13/2023   AST 27 07/13/2023   ALT 59 (H) 07/13/2023   ALKPHOS 94 07/13/2023   BILITOT 0.3 07/13/2023   GFRNONAA >60 01/04/2019   GFRAA >60 01/04/2019    Lab Results  Component Value Date   CEA 0.9 05/31/2013     Medications: I have reviewed the patient's current medications.   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 Initiation of adjuvant Gleevec  08/31/2013.   Gleevec  dose reduced to 400 mg every other day beginning 10/18/2013 secondary to neutropenia.    Gleevec  dose adjusted to 200 mg daily beginning 11/29/2013.   Gleevec  dose adjusted to 300 mg daily beginning 12/13/2013. Upper endoscopy 07/12/2014 without evidence of recurrent tumor in the stomach Gleevec  completed February 2018 Mild skin rash most likely secondary to Gleevec . History of Mild periorbital edema secondary to Gleevec . Severe neutropenia secondary to Gleevec  10/12/2013. Resolved. History of Mild lower extremity edema likely related to Gleevec . Admission 07/18/2014 with a small bowel obstruction-resolved with bowel rest, CT 07/18/2014 without evidence of recurrent tumor. Follow-up CT 02/16/2015 with no evidence of gastric region mass or wall thickening. 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 Status post evaluation in the emergency department for abdominal pain 05/12/2016. CT with no evidence for acute intra-abdominal or pelvic pathology. Sigmoid colon diverticulosis with no definite surrounding bowel inflammation. Slight decreased size of fat density masslike area within the upper pelvis thought to represent omental infarct; scattered peripheral calcifications now present within the lesion. Admission 07/20/2018 with a recurrent small bowel obstruction, CT negative for evidence of recurrent gastrointestinal stromal tumor Right thyroid  nodules on ultrasound 06/17/2023: Biopsy of a right thyroid  nodule 08/18/2023: Benign follicular nodule      Disposition: Timothy Hall remains in clinical remission from the gastrointestinal stromal tumor.  He would like to continue follow-up in the oncology clinic.  He will return for an office visit in 1 year.  He plans to schedule a colonoscopy for routine surveillance and with the recent episode  of rectal bleeding.  He continues follow-up in the lung cancer screening clinic.  Arley Hof, MD  08/08/2024  9:31 AM   "

## 2024-08-08 NOTE — Telephone Encounter (Signed)
 Spouse, Mliss (on [place]), called and left VM for results. Returned call but no answer. Left VM

## 2024-08-08 NOTE — Telephone Encounter (Signed)
 Spoke with wife, Mliss, [facility]) and reviewed results of LDCT. She said they had visit with Dr. Cloretta this morning and also discussed CT results.  Patient does not believe he was 'sick' with any symptoms of congestion, fever or increased shortness of breath. He does have a cough sometimes but did not really think much about it, as he has 'COPD' she says.  Agreeable to  follow next LDCT in 6 months per Lauraine Slain recommendation.  Will call closer to date for scheduling.  No further questions.  New order placed for scheduling and results/ plan sent to PCP.

## 2024-08-08 NOTE — Addendum Note (Signed)
 Addended by: DIONISIO AQUAS on: 08/08/2024 11:10 AM   Modules accepted: Orders

## 2024-08-09 ENCOUNTER — Other Ambulatory Visit: Payer: Self-pay | Admitting: Medical

## 2024-08-20 ENCOUNTER — Other Ambulatory Visit: Payer: Self-pay | Admitting: Medical

## 2024-08-22 NOTE — Telephone Encounter (Signed)
 Pt needs to follow up in  with me in mid march and on review he never did future fasting labs I had placed. Please have him do those labs10-14 days before appointment with me. Rx refill metformin  sent.

## 2024-08-23 NOTE — Telephone Encounter (Signed)
 Lab and office apt scheduled

## 2024-09-14 ENCOUNTER — Other Ambulatory Visit

## 2024-09-26 ENCOUNTER — Ambulatory Visit: Admitting: Medical

## 2025-08-07 ENCOUNTER — Inpatient Hospital Stay: Admitting: Oncology
